# Patient Record
Sex: Male | Born: 1980 | Race: White | Hispanic: No | Marital: Single | Smoking: Never smoker
Health system: Southern US, Community
[De-identification: ages and names within clinical notes are randomized; demographics above are authoritative.]

## PROBLEM LIST (undated history)

## (undated) DIAGNOSIS — F32A Depression, unspecified: Secondary | ICD-10-CM

## (undated) DIAGNOSIS — N400 Enlarged prostate without lower urinary tract symptoms: Secondary | ICD-10-CM

## (undated) DIAGNOSIS — F329 Major depressive disorder, single episode, unspecified: Secondary | ICD-10-CM

## (undated) HISTORY — PX: WRIST SURGERY: SHX841

## (undated) HISTORY — PX: EYE SURGERY: SHX253

---

## 2004-06-24 ENCOUNTER — Emergency Department: Payer: Self-pay | Admitting: Emergency Medicine

## 2005-01-10 ENCOUNTER — Emergency Department (HOSPITAL_COMMUNITY): Admission: EM | Admit: 2005-01-10 | Discharge: 2005-01-10 | Payer: Self-pay | Admitting: *Deleted

## 2009-11-20 ENCOUNTER — Ambulatory Visit: Payer: Self-pay | Admitting: Ophthalmology

## 2010-01-01 ENCOUNTER — Ambulatory Visit: Payer: Self-pay | Admitting: Ophthalmology

## 2010-01-06 ENCOUNTER — Ambulatory Visit: Payer: Self-pay | Admitting: Ophthalmology

## 2010-10-02 ENCOUNTER — Emergency Department: Payer: Self-pay | Admitting: Emergency Medicine

## 2010-10-22 ENCOUNTER — Emergency Department: Payer: Self-pay | Admitting: Emergency Medicine

## 2012-03-17 ENCOUNTER — Emergency Department: Payer: Self-pay | Admitting: Emergency Medicine

## 2012-09-06 ENCOUNTER — Ambulatory Visit: Payer: Self-pay | Admitting: Neurology

## 2013-07-11 ENCOUNTER — Emergency Department: Payer: Self-pay | Admitting: Emergency Medicine

## 2013-08-28 ENCOUNTER — Emergency Department: Payer: Self-pay | Admitting: Emergency Medicine

## 2013-08-28 LAB — URINALYSIS, COMPLETE
BACTERIA: NONE SEEN
Bilirubin,UR: NEGATIVE
GLUCOSE, UR: NEGATIVE mg/dL (ref 0–75)
KETONE: NEGATIVE
LEUKOCYTE ESTERASE: NEGATIVE
Nitrite: NEGATIVE
PROTEIN: NEGATIVE
Ph: 6 (ref 4.5–8.0)
SQUAMOUS EPITHELIAL: NONE SEEN
Specific Gravity: 1.005 (ref 1.003–1.030)
WBC UR: <1 /HPF (ref 0–5)

## 2013-08-28 LAB — COMPREHENSIVE METABOLIC PANEL
ALK PHOS: 130 U/L — AB
ALT: 57 U/L (ref 12–78)
Albumin: 4.3 g/dL (ref 3.4–5.0)
Anion Gap: 6 — ABNORMAL LOW (ref 7–16)
BILIRUBIN TOTAL: 0.3 mg/dL (ref 0.2–1.0)
BUN: 11 mg/dL (ref 7–18)
CALCIUM: 9.4 mg/dL (ref 8.5–10.1)
CHLORIDE: 101 mmol/L (ref 98–107)
CREATININE: 0.94 mg/dL (ref 0.60–1.30)
Co2: 30 mmol/L (ref 21–32)
EGFR (African American): >60
GLUCOSE: 95 mg/dL (ref 65–99)
Osmolality: 273 (ref 275–301)
Potassium: 3.8 mmol/L (ref 3.5–5.1)
SGOT(AST): 37 U/L (ref 15–37)
Sodium: 137 mmol/L (ref 136–145)
Total Protein: 8.6 g/dL — ABNORMAL HIGH (ref 6.4–8.2)

## 2013-08-28 LAB — CBC
HCT: 46.2 % (ref 40.0–52.0)
HGB: 15.5 g/dL (ref 13.0–18.0)
MCH: 34 pg (ref 26.0–34.0)
MCHC: 33.6 g/dL (ref 32.0–36.0)
MCV: 101 fL — AB (ref 80–100)
PLATELETS: 261 10*3/uL (ref 150–440)
RBC: 4.57 10*6/uL (ref 4.40–5.90)
RDW: 13.6 % (ref 11.5–14.5)
WBC: 9.4 10*3/uL (ref 3.8–10.6)

## 2013-08-28 LAB — DRUG SCREEN, URINE

## 2013-08-28 LAB — ETHANOL: Ethanol %: <0.003 % (ref 0.000–0.080)

## 2013-08-28 LAB — ACETAMINOPHEN LEVEL

## 2013-08-28 LAB — SALICYLATE LEVEL: Salicylates, Serum: <1.7 mg/dL

## 2013-11-24 ENCOUNTER — Emergency Department: Payer: Self-pay | Admitting: Emergency Medicine

## 2014-05-15 ENCOUNTER — Emergency Department: Payer: Self-pay | Admitting: Internal Medicine

## 2014-05-15 LAB — CBC
HCT: 46.3 % (ref 40.0–52.0)
HGB: 15.6 g/dL (ref 13.0–18.0)
MCH: 32.7 pg (ref 26.0–34.0)
MCHC: 33.6 g/dL (ref 32.0–36.0)
MCV: 97 fL (ref 80–100)
Platelet: 246 10*3/uL (ref 150–440)
RBC: 4.77 10*6/uL (ref 4.40–5.90)
RDW: 13.2 % (ref 11.5–14.5)
WBC: 11.1 10*3/uL — ABNORMAL HIGH (ref 3.8–10.6)

## 2014-05-15 LAB — BASIC METABOLIC PANEL
Anion Gap: 8 (ref 7–16)
BUN: 11 mg/dL (ref 7–18)
CALCIUM: 9.3 mg/dL (ref 8.5–10.1)
CREATININE: 0.96 mg/dL (ref 0.60–1.30)
Chloride: 100 mmol/L (ref 98–107)
Co2: 30 mmol/L (ref 21–32)
GLUCOSE: 99 mg/dL (ref 65–99)
Osmolality: 275 (ref 275–301)
POTASSIUM: 4.2 mmol/L (ref 3.5–5.1)
SODIUM: 138 mmol/L (ref 136–145)

## 2014-05-17 ENCOUNTER — Emergency Department: Payer: Self-pay | Admitting: Emergency Medicine

## 2014-05-20 LAB — CULTURE, BLOOD (SINGLE)

## 2014-07-08 ENCOUNTER — Emergency Department: Payer: Self-pay | Admitting: Emergency Medicine

## 2014-08-11 NOTE — Consult Note (Signed)
Brief Consult Note: Diagnosis: MDD, Alcohol Dependence.   Patient was seen by consultant.   Consult note dictated.   Orders entered.   Comments: Pt seen for Psychiatric Eval. He denied Si/HI or plans and stated that he is willing to go for rehab. he is experiencing w/d from Effexor at this time.  Will restart the medication.  He can be d/c as he is stable.  Electronic Signatures: Rhunette CroftFaheem, Kayly Kriegel S (MD)  (Signed 12-May-15 10:29)  Authored: Brief Consult Note   Last Updated: 12-May-15 10:29 by Rhunette CroftFaheem, Nilaya Bouie S (MD)

## 2014-08-11 NOTE — Consult Note (Signed)
PATIENT NAME:  Ian Munoz, Ian Munoz MR#:  [id number] DATE OF BIRTH:  Aug 07, 1980  DATE OF CONSULTATION:  08/29/2013  REFERRING PHYSICIAN:  Sharyn Creamer, MD CONSULTING PHYSICIAN:  Ardeen Fillers. Garnetta Buddy, MD  REASON FOR CONSULTATION: "I've been feeling depressed."   HISTORY OF PRESENT ILLNESS:  The patient is a 34 year old single white male who presented to the ER and reported that he has been having issues with his depression and alcoholism. He reported that he has been feeling severely depressed as he went to the [facility] for 3 days and was discharged to a 60 day program at the [facility]. He reported that when he arrived to the rescue mission, they seized all of his medications and did not allow him to take the Effexor which was prescribed by the [facility]. He reported as soon as he stopped the Effexor he started having withdrawal symptoms from the same and was having shakes, which were getting worse. He reported that he was unable to eat and did not have any energy. He reported that he was unable to sleep. He feels like that he is going to die, although he did not have any suicidal or homicidal ideations or plans. The patient reported that initially he was prescribed Lexapro by his physician in [place], but he was unable to afford the medication. He reported that the Lexapro was not helping him as well. The patient reported that he noticed that he was becoming more energetic when he was taking the Effexor and was happier. He was taking 150 mg in the morning. The patient stated that he has not consumed alcohol since May 4th, as he was consuming 2 to 3 40-ounce at bedtime. He stated that he is willing to go to the rehabilitation program at this time. He currently denied having any withdrawal symptoms. He stated that he is interested in going back to the rehabilitation program at this time. He currently denied having any suicidal or homicidal ideations or plans.   PAST PSYCHIATRIC HISTORY: The  patient has a history of overdose on pills in the past. He was admitted to the behavioral health unit at one time in the past. He just finished a three-day rehabilitation program at [facility] in [place], and then was transferred to the [facility]. However, he is willing to go to another rehabilitation program.   FAMILY HISTORY: The patient reports a history of alcoholism in his father.   ALLERGIES: No known drug allergies.   CURRENT MEDICATION:  Effexor 150 mg in the morning.   SOCIAL HISTORY:  The patient reported that he currently lives with his sister.  Currently, he is unemployed. He has never been married and does not have any children. He currently denied any pending legal charges.   ANCILLARY DATA: Temperature 98.3, pulse 72, respirations 20, blood pressure 101/66.   LABORATORY DATA: Glucose 95, BUN 11, creatinine 0.94, sodium 137, potassium 3.8, chloride 101, bicarbonate 30, anion gap 6, osmolality 273, calcium 9.4. Blood alcohol level less than 3. Protein 8.6, albumin 4.3, bilirubin 0.3, alkaline phosphatase 130, AST 37, ALT 57. UDS is negative. WBC 9.4, RBC 4.57, hemoglobin 15.5, hematocrit 46.2, platelet count 261, MCV 101, RDW 13.6.     REVIEW OF SYSTEMS:   CONSTITUTIONAL: Denies any fever or chills. No weight changes.  EYES: No double or blurred vision.  RESPIRATORY: No shortness of breath or cough.  CARDIOVASCULAR: Denies any chest pain or orthopnea.  GASTROINTESTINAL: No abdominal pain, nausea, vomiting, diarrhea.  GENITOURINARY:  No incontinence. No hematuria.  MUSCULOSKELETAL: No muscle ache or joint pain.  NEUROLOGIC: No tingling or weakness.   MENTAL STATUS EXAMINATION: The patient is a moderately-built male who was sitting in the bed. He maintained fair eye contact. Speech was normal in tone and volume. Mood was fine. Affect was congruent. Thought process was logical, goal directed. Thought content was nondelusional. He currently denied having any  suicidal or homicidal ideation or plans. He demonstrated fair insight and judgment. His language was appropriate. Fund of knowledge seems normal. No perceptual disturbances are noted.   DIAGNOSTIC IMPRESSION: AXIS I: 1.  Major depressive disorder, recurrent, moderate.  2.  Alcohol dependence.  AXIS II:  None.  AXIS III:  None reported.   TREATMENT PLAN: 1.  The patient will be restarted back on Effexor-XR 150 mg p.o. q.a.m.  2.  I will release him from the IVC, as he does not meet the criteria at this time and will be discharged as he is clinically stable.  3.  He will be given a prescription of the Effexor-XR 150 mg at the time of discharge.  4.  He was given resources about the different area rehabilitation programs and rescue missions, and he demonstrated  understanding. I advised the patient to come back if he noticed worsening of his symptoms, and he agreed with the plan. The patient will be discharged as he is clinically stable.   Thank you for allowing me to participate in the care of this patient.   ____________________________ Ardeen FillersUzma S. Garnetta BuddyFaheem, MD usf:dmm D: 08/29/2013 12:26:05 ET T: 08/29/2013 12:51:30 ET JOB#: [id number]  cc: Ardeen FillersUzma S. Garnetta BuddyFaheem, MD, <Dictator> Rhunette CroftUZMA S Fernando Stoiber MD ELECTRONICALLY SIGNED 08/31/2013 10:59

## 2014-08-24 ENCOUNTER — Encounter: Payer: Self-pay | Admitting: *Deleted

## 2014-08-24 ENCOUNTER — Emergency Department
Admission: EM | Admit: 2014-08-24 | Discharge: 2014-08-25 | Discharge status: Against Medical Advice | Payer: Self-pay | Attending: Emergency Medicine | Admitting: Emergency Medicine

## 2014-08-24 DIAGNOSIS — R109 Unspecified abdominal pain: Secondary | ICD-10-CM | POA: Insufficient documentation

## 2014-08-24 LAB — COMPREHENSIVE METABOLIC PANEL
ALBUMIN: 4.3 g/dL (ref 3.5–5.0)
ALT: 16 U/L — ABNORMAL LOW (ref 17–63)
ANION GAP: 6 (ref 5–15)
AST: 16 U/L (ref 15–41)
Alkaline Phosphatase: 81 U/L (ref 38–126)
BUN: 9 mg/dL (ref 6–20)
CALCIUM: 9 mg/dL (ref 8.9–10.3)
CO2: 30 mmol/L (ref 22–32)
CREATININE: 0.86 mg/dL (ref 0.61–1.24)
Chloride: 103 mmol/L (ref 101–111)
GFR calc Af Amer: >60 mL/min (ref >60)
GFR calc non Af Amer: >60 mL/min (ref >60)
GLUCOSE: 111 mg/dL — AB (ref 65–99)
POTASSIUM: 4.1 mmol/L (ref 3.5–5.1)
SODIUM: 139 mmol/L (ref 135–145)
TOTAL PROTEIN: 7.2 g/dL (ref 6.5–8.1)
Total Bilirubin: 0.5 mg/dL (ref 0.3–1.2)

## 2014-08-24 LAB — CBC
HEMATOCRIT: 52.5 % — AB (ref 40.0–52.0)
HEMOGLOBIN: 17.4 g/dL (ref 13.0–18.0)
MCH: 32.5 pg (ref 26.0–34.0)
MCHC: 33.1 g/dL (ref 32.0–36.0)
MCV: 98 fL (ref 80.0–100.0)
Platelets: 250 10*3/uL (ref 150–440)
RBC: 5.35 MIL/uL (ref 4.40–5.90)
RDW: 12.9 % (ref 11.5–14.5)
WBC: 7.4 10*3/uL (ref 3.8–10.6)

## 2014-08-24 NOTE — ED Notes (Signed)
Pt reports left sided abdominal pain radiating to back, increased urination, pain with urination, swelling in left testicle

## 2014-08-25 LAB — URINALYSIS COMPLETE WITH MICROSCOPIC (ARMC ONLY)
Bacteria, UA: NONE SEEN
Bilirubin Urine: NEGATIVE
Glucose, UA: NEGATIVE mg/dL
HGB URINE DIPSTICK: NEGATIVE
KETONES UR: NEGATIVE mg/dL
Leukocytes, UA: NEGATIVE
Nitrite: NEGATIVE
Protein, ur: NEGATIVE mg/dL
SPECIFIC GRAVITY, URINE: 1.004 — AB (ref 1.005–1.030)
SQUAMOUS EPITHELIAL / LPF: NONE SEEN
pH: 7 (ref 5.0–8.0)

## 2014-10-22 ENCOUNTER — Encounter: Payer: Self-pay | Admitting: Emergency Medicine

## 2014-10-22 ENCOUNTER — Emergency Department
Admission: EM | Admit: 2014-10-22 | Discharge: 2014-10-22 | Disposition: A | Discharge status: Home / Self Care | Payer: Self-pay | Attending: Emergency Medicine | Admitting: Emergency Medicine

## 2014-10-22 DIAGNOSIS — N451 Epididymitis: Secondary | ICD-10-CM | POA: Insufficient documentation

## 2014-10-22 DIAGNOSIS — R300 Dysuria: Secondary | ICD-10-CM | POA: Insufficient documentation

## 2014-10-22 HISTORY — DX: Benign prostatic hyperplasia without lower urinary tract symptoms: N40.0

## 2014-10-22 LAB — URINALYSIS COMPLETE WITH MICROSCOPIC (ARMC ONLY)
BACTERIA UA: NONE SEEN
BILIRUBIN URINE: NEGATIVE
GLUCOSE, UA: NEGATIVE mg/dL
HGB URINE DIPSTICK: NEGATIVE
Ketones, ur: NEGATIVE mg/dL
Leukocytes, UA: NEGATIVE
NITRITE: NEGATIVE
PROTEIN: NEGATIVE mg/dL
RBC / HPF: NONE SEEN RBC/hpf (ref 0–5)
Specific Gravity, Urine: 1.003 — ABNORMAL LOW (ref 1.005–1.030)
Squamous Epithelial / LPF: NONE SEEN
WBC UA: NONE SEEN WBC/hpf (ref 0–5)
pH: 6 (ref 5.0–8.0)

## 2014-10-22 MED ORDER — OXYCODONE-ACETAMINOPHEN 5-325 MG PO TABS
ORAL_TABLET | ORAL | Status: AC
  Administered 2014-10-22: 1 via ORAL
  Filled 2014-10-22: qty 1

## 2014-10-22 MED ORDER — DOXYCYCLINE HYCLATE 100 MG PO CAPS: 100.0000 mg | ORAL_CAPSULE | Freq: Two times a day (BID) | ORAL | Status: DC

## 2014-10-22 MED ORDER — OXYCODONE-ACETAMINOPHEN 5-325 MG PO TABS: 1.0000 | ORAL_TABLET | Freq: Four times a day (QID) | ORAL | Status: DC | PRN

## 2014-10-22 MED ORDER — OXYCODONE-ACETAMINOPHEN 5-325 MG PO TABS
1.0000 | ORAL_TABLET | Freq: Once | ORAL | Status: AC
  Administered 2014-10-22: 1 via ORAL

## 2014-10-22 NOTE — Discharge Instructions (Signed)
Epididymitis Epididymitis is a swelling (inflammation) of the epididymis. The epididymis is a cord-like structure along the back part of the testicle. Epididymitis is usually, but not always, caused by infection. This is usually a sudden problem beginning with chills, fever and pain behind the scrotum and in the testicle. There may be swelling and redness of the testicle. DIAGNOSIS  Physical examination will reveal a tender, swollen epididymis. Sometimes, cultures are obtained from the urine or from prostate secretions to help find out if there is an infection or if the cause is a different problem. Sometimes, blood work is performed to see if your white blood cell count is elevated and if a germ (bacterial) or viral infection is present. Using this knowledge, an appropriate medicine which kills germs (antibiotic) can be chosen by your caregiver. A viral infection causing epididymitis will most often go away (resolve) without treatment. HOME CARE INSTRUCTIONS   Hot sitz baths for 20 minutes, 4 times per day, may help relieve pain.  Only take over-the-counter or prescription medicines for pain, discomfort or fever as directed by your caregiver.  Take all medicines, including antibiotics, as directed. Take the antibiotics for the full prescribed length of time even if you are feeling better.  It is very important to keep all follow-up appointments. SEEK IMMEDIATE MEDICAL CARE IF:   You have a fever.  You have pain not relieved with medicines.  You have any worsening of your problems.  Your pain seems to come and go.  You develop pain, redness, and swelling in the scrotum and surrounding areas. MAKE SURE YOU:   Understand these instructions.  Will watch your condition.  Will get help right away if you are not doing well or get worse. Document Released: 04/03/2000 Document Revised: 06/29/2011 Document Reviewed: 02/21/2009 [facility] Patient Information 2015 [employer] [place]. This information  is not intended to replace advice given to you by your health care provider. Make sure you discuss any questions you have with your health care provider.   You were prescribed a medication that is potentially sedating. Do not drink alcohol, drive or participate in any other potentially dangerous activities while taking this medication as it may make you sleepy. Do not take this medication with any other sedating medications, either prescription or over-the-counter. If you were prescribed Percocet or Vicodin, do not take these with acetaminophen (Tylenol) as it is already contained within these medications.   Opioid pain medications (or "narcotics") can be habit forming.  Use it as little as possible to achieve adequate pain control.  Do not use or use it with extreme caution if you have a history of opiate abuse or dependence.  If you are on a pain contract with your primary care doctor or a pain specialist, be sure to let them know you were prescribed this medication today from the [facility].  This medication is intended for your use only - do not give any to anyone else and keep it in a secure place where nobody else, especially children and pets, have access to it.  It will also cause or worsen constipation, so you may want to consider taking an over-the-counter stool softener while you are taking this medication.

## 2014-10-22 NOTE — ED Notes (Signed)
Pt arrived to the ED for complaints of left side groin pain. Pt [facility] and the Pt states that he is taking Flomax for it. Pt is AOx4 in no apparent distress.

## 2014-10-22 NOTE — ED Provider Notes (Signed)
[facility] Emergency Department Provider Note  ____________________________________________  Time seen: 8:45 PM  I have reviewed the triage vital signs and the nursing notes.   HISTORY  Chief Complaint Groin Pain    HPI Ian Munoz is a 34 y.o. male who complains of worsened left groin pain for the past few days. Review of available records in urology notes reveals that he has had this problem for 3-4 years. The patient states there are no serious changes other than the increase in pain. He and his significant other reports being frustrated with the chronicity of his symptoms without any apparent cause. He does note that he was seen in urology clinic a week ago where he was noted to have a boggy tender prostate consistent with prostatitis, but was started on Flomax for this. He has not taken any antibiotics so far. He denies any penile discharge but does report some dysuria.     Past Medical History  Diagnosis Date  . Prostate enlargement     There are no active problems to display for this patient.   History reviewed. No pertinent past surgical history.  Current Outpatient Rx  Name  Route  Sig  Dispense  Refill  . doxycycline (VIBRAMYCIN) 100 MG capsule   Oral   Take 1 capsule (100 mg total) by mouth 2 (two) times daily.   28 capsule   0   . oxyCODONE-acetaminophen (ROXICET) 5-325 MG per tablet   Oral   Take 1 tablet by mouth every 6 (six) hours as needed for severe pain.   8 tablet   0     Allergies Review of patient's allergies indicates no known allergies.  History reviewed. No pertinent family history.  Social History History  Substance Use Topics  . Smoking status: Never Smoker   . Smokeless tobacco: Current User    Types: Chew  . Alcohol Use: No    Review of Systems  Constitutional: No fever or chills. No weight changes Eyes:No blurry vision or double vision.  ENT: No sore throat. Cardiovascular: No chest  pain. Respiratory: No dyspnea or cough. Gastrointestinal: Negative for abdominal pain, vomiting and diarrhea.  No BRBPR or melena. Genitourinary: Positive dysuria and left scrotal pain. Musculoskeletal: Negative for back pain. No joint swelling or pain. Skin: Negative for rash. Neurological: Negative for headaches, focal weakness or numbness. Psychiatric:No anxiety or depression.   Endocrine:No hot/cold intolerance, changes in energy, or sleep difficulty.  10-point ROS otherwise negative.  ____________________________________________   PHYSICAL EXAM:  VITAL SIGNS: ED Triage Vitals  Enc Vitals Group     BP 10/22/14 2007 134/69 mmHg     Pulse Rate 10/22/14 2007 111     Resp 10/22/14 2007 18     Temp 10/22/14 2007 98.2 F (36.8 C)     Temp src --      SpO2 10/22/14 2007 100 %     Weight 10/22/14 2007 145 lb (65.772 kg)     Height 10/22/14 2007  (1.575 m)     Head Cir --      Peak Flow --      Pain Score 10/22/14 2008 8     Pain Loc --      Pain Edu? --      Excl. in GC? --      Constitutional: Alert and oriented. Well appearing and in no distress. Eyes: No scleral icterus. No conjunctival pallor. PERRL. EOMI ENT   Head: Normocephalic and atraumatic.   Nose:  No congestion/rhinnorhea. No septal hematoma   Mouth/Throat: MMM, no pharyngeal erythema. No peritonsillar mass. No uvula shift.   Neck: No stridor. No SubQ emphysema. No meningismus. Hematological/Lymphatic/Immunilogical: No cervical lymphadenopathy. Cardiovascular: RRR. Normal and symmetric distal pulses are present in all extremities. No murmurs, rubs, or gallops. Respiratory: Normal respiratory effort without tachypnea nor retractions. Breath sounds are clear and equal bilaterally. No wheezes/rales/rhonchi. Gastrointestinal: Soft and nontender. No distention. There is no CVA tenderness.  No rebound, rigidity, or guarding. Genitourinary: Normal-appearing genitalia. No rash or swelling. There is  tenderness of the epididymis bilaterally. No testicular tenderness or abnormal lie. Musculoskeletal: Nontender with normal range of motion in all extremities. No joint effusions.  No lower extremity tenderness.  No edema. Neurologic:   Normal speech and language.  CN 2-10 normal. Motor grossly intact. No pronator drift.  Normal gait. No gross focal neurologic deficits are appreciated.  Skin:  Skin is warm, dry and intact. No rash noted.  No petechiae, purpura, or bullae. Psychiatric: Mood and affect are normal. Speech and behavior are normal. Patient exhibits appropriate insight and judgment.  ____________________________________________    LABS (pertinent positives/negatives) (all labs ordered are listed, but only abnormal results are displayed) Labs Reviewed  URINALYSIS COMPLETEWITH MICROSCOPIC ([facility] ONLY) - Abnormal; Notable for the following:    Color, Urine COLORLESS (*)    APPearance CLEAR (*)    Specific Gravity, Urine 1.003 (*)    All other components within normal limits   ____________________________________________   EKG    ____________________________________________    RADIOLOGY    ____________________________________________   PROCEDURES  ____________________________________________   INITIAL IMPRESSION / ASSESSMENT AND PLAN / ED COURSE  Pertinent labs & imaging results that were available during my care of the patient were reviewed by me and considered in my medical decision making (see chart for details).  Evaluation consistent with epididymitis, and by history the patient may have prostatitis 2. This sounds very chronic in nature undergoing urology evaluation with likely plan for cystoscopy in the future. We will evaluate for urinary tract infection with urinalysis. Plan to start the patient on doxycycline for trial of symptom relief, as well as Percocet as needed for symptom control. Advised to follow-up with urology for continued monitoring and  management. No evidence of torsion.  ____________________________________________   FINAL CLINICAL IMPRESSION(S) / ED DIAGNOSES  Final diagnoses:  Epididymitis      Sharman CheekPhillip Jodelle Fausto, MD 10/22/14 [phone]

## 2014-11-14 DIAGNOSIS — F32A Depression, unspecified: Secondary | ICD-10-CM | POA: Insufficient documentation

## 2015-02-05 ENCOUNTER — Encounter: Payer: Self-pay | Admitting: *Deleted

## 2015-02-05 ENCOUNTER — Emergency Department
Admission: EM | Admit: 2015-02-05 | Discharge: 2015-02-05 | Disposition: A | Discharge status: Home / Self Care | Payer: Self-pay | Attending: Emergency Medicine | Admitting: Emergency Medicine

## 2015-02-05 ENCOUNTER — Emergency Department: Payer: Self-pay

## 2015-02-05 DIAGNOSIS — M25511 Pain in right shoulder: Secondary | ICD-10-CM | POA: Insufficient documentation

## 2015-02-05 MED ORDER — DIAZEPAM 5 MG/ML IJ SOLN
5.0000 mg | Freq: Once | INTRAMUSCULAR | Status: AC
  Administered 2015-02-05: 5 mg via INTRAMUSCULAR
  Filled 2015-02-05: qty 2

## 2015-02-05 MED ORDER — DIAZEPAM 5 MG/ML IJ SOLN: 5.0000 mg | Freq: Once | INTRAMUSCULAR | Status: DC

## 2015-02-05 MED ORDER — IBUPROFEN 800 MG PO TABS: 800.0000 mg | ORAL_TABLET | Freq: Three times a day (TID) | ORAL | Status: DC | PRN

## 2015-02-05 MED ORDER — CYCLOBENZAPRINE HCL 10 MG PO TABS: 10.0000 mg | ORAL_TABLET | Freq: Three times a day (TID) | ORAL | Status: DC | PRN

## 2015-02-05 MED ORDER — KETOROLAC TROMETHAMINE 60 MG/2ML IM SOLN
60.0000 mg | Freq: Once | INTRAMUSCULAR | Status: AC
  Administered 2015-02-05: 60 mg via INTRAMUSCULAR
  Filled 2015-02-05: qty 2

## 2015-02-05 NOTE — Discharge Instructions (Signed)
Joint Pain Joint pain, which is also called arthralgia, can be caused by many things. Joint pain often goes away when you follow your health care provider's instructions for relieving pain at home. However, joint pain can also be caused by conditions that require further treatment. Common causes of joint pain include: 1. Bruising in the area of the joint. 2. Overuse of the joint. 3. Wear and tear on the joints that occur with aging (osteoarthritis). 4. Various other forms of arthritis. 5. A buildup of a crystal form of uric acid in the joint (gout). 6. Infections of the joint (septic arthritis) or of the bone (osteomyelitis). Your health care provider may recommend medicine to help with the pain. If your joint pain continues, additional tests may be needed to diagnose your condition. HOME CARE INSTRUCTIONS Watch your condition for any changes. Follow these instructions as directed to lessen the pain that you are feeling. 1. Take medicines only as directed by your health care provider. 2. Rest the affected area for as long as your health care provider says that you should. If directed to do so, raise the painful joint above the level of your heart while you are sitting or lying down. 3. Do not do things that cause or worsen pain. 4. If directed, apply ice to the painful area:  Put ice in a plastic bag.  Place a towel between your skin and the bag.  Leave the ice on for 20 minutes, 2-3 times per day. 5. Wear an elastic bandage, splint, or sling as directed by your health care provider. Loosen the elastic bandage or splint if your fingers or toes become numb and tingle, or if they turn cold and blue. 6. Begin exercising or stretching the affected area as directed by your health care provider. Ask your health care provider what types of exercise are safe for you. 7. Keep all follow-up visits as directed by your health care provider. This is important. SEEK MEDICAL CARE IF: 1. Your pain  increases, and medicine does not help. 2. Your joint pain does not improve within 3 days. 3. You have increased bruising or swelling. 4. You have a fever. 5. You lose 10 lb (4.5 kg) or more without trying. SEEK IMMEDIATE MEDICAL CARE IF: 1. You are not able to move the joint. 2. Your fingers or toes become numb or they turn cold and blue.   This information is not intended to replace advice given to you by your health care provider. Make sure you discuss any questions you have with your health care provider.   Document Released: 04/06/2005 Document Revised: 04/27/2014 Document Reviewed: 01/16/2014 Elsevier Interactive Patient Education 2016 [employer]  [employer] Therapy Heat therapy can help ease sore, stiff, injured, and tight muscles and joints. Heat relaxes your muscles, which may help ease your pain.  RISKS AND COMPLICATIONS If you have any of the following conditions, do not use heat therapy unless your health care provider has approved: 7. Poor circulation. 8. Healing wounds or scarred skin in the area being treated. 9. Diabetes, heart disease, or high blood pressure. 10. Not being able to feel (numbness) the area being treated. 11. Unusual swelling of the area being treated. 12. Active infections. 13. Blood clots. 14. Cancer. 15. Inability to communicate pain. This may include young children and people who have problems with their brain function (dementia). 16. Pregnancy. Heat therapy should only be used on old, pre-existing, or long-lasting (chronic) injuries. Do not use heat therapy on new injuries  unless directed by your health care provider. HOW TO USE HEAT THERAPY There are several different kinds of heat therapy, including: 8. Moist heat pack. 9. Warm water bath. 10. Hot water bottle. 11. Electric heating pad. 12. Heated gel pack. 13. Heated wrap. 14. Electric heating pad. Use the heat therapy method suggested by your health care provider. Follow your health care  provider's instructions on when and how to use heat therapy. GENERAL HEAT THERAPY RECOMMENDATIONS 6. Do not sleep while using heat therapy. Only use heat therapy while you are awake. 7. Your skin may turn pink while using heat therapy. Do not use heat therapy if your skin turns red. 8. Do not use heat therapy if you have new pain. 9. High heat or long exposure to heat can cause burns. Be careful when using heat therapy to avoid burning your skin. 10. Do not use heat therapy on areas of your skin that are already irritated, such as with a rash or sunburn. SEEK MEDICAL CARE IF: 3. You have blisters, redness, swelling, or numbness. 4. You have new pain. 5. Your pain is worse. MAKE SURE YOU: 1. Understand these instructions. 2. Will watch your condition. 3. Will get help right away if you are not doing well or get worse.   This information is not intended to replace advice given to you by your health care provider. Make sure you discuss any questions you have with your health care provider.   Document Released: 06/29/2011 Document Revised: 04/27/2014 Document Reviewed: 05/30/2013 Elsevier Interactive Patient Education 2016 Else[employer] Inc[employer]  Shoulder Pain The shoulder is the joint that connects your arms to your body. The bones that form the shoulder joint include the upper arm bone (humerus), the shoulder blade (scapula), and the collarbone (clavicle). The top of the humerus is shaped like a ball and fits into a rather flat socket on the scapula (glenoid cavity). A combination of muscles and strong, fibrous tissues that connect muscles to bones (tendons) support your shoulder joint and hold the ball in the socket. Small, fluid-filled sacs (bursae) are located in different areas of the joint. They act as cushions between the bones and the overlying soft tissues and help reduce friction between the gliding tendons and the bone as you move your arm. Your shoulder joint allows a wide range of motion in  your arm. This range of motion allows you to do things like scratch your back or throw a ball. However, this range of motion also makes your shoulder more prone to pain from overuse and injury. Causes of shoulder pain can originate from both injury and overuse and usually can be grouped in the following four categories: 17. Redness, swelling, and pain (inflammation) of the tendon (tendinitis) or the bursae (bursitis). 18. Instability, such as a dislocation of the joint. 19. Inflammation of the joint (arthritis). 20. Broken bone (fracture). HOME CARE INSTRUCTIONS  15. Apply ice to the sore area.  Put ice in a plastic bag.  Place a towel between your skin and the bag.  Leave the ice on for 15-20 minutes, 3-4 times per day for the first 2 days, or as directed by your health care provider. 16. Stop using cold packs if they do not help with the pain. 17. If you have a shoulder sling or immobilizer, wear it as long as your caregiver instructs. Only remove it to shower or bathe. Move your arm as little as possible, but keep your hand moving to prevent swelling. 18. Squeeze a soft  ball or foam pad as much as possible to help prevent swelling. 19. Only take over-the-counter or prescription medicines for pain, discomfort, or fever as directed by your caregiver. SEEK MEDICAL CARE IF:  11. Your shoulder pain increases, or new pain develops in your arm, hand, or fingers. 12. Your hand or fingers become cold and numb. 13. Your pain is not relieved with medicines. SEEK IMMEDIATE MEDICAL CARE IF:  6. Your arm, hand, or fingers are numb or tingling. 7. Your arm, hand, or fingers are significantly swollen or turn white or blue. MAKE SURE YOU:  4. Understand these instructions. 5. Will watch your condition. 6. Will get help right away if you are not doing well or get worse.   This information is not intended to replace advice given to you by your health care provider. Make sure you discuss any questions  you have with your health care provider.   Document Released: 01/14/2005 Document Revised: 04/27/2014 Document Reviewed: 07/30/2014 Elsevier Interactive Patient Education 2016 [employer] Inc[employer]  Shoulder Range of Motion Exercises Shoulder range of motion (ROM) exercises are designed to keep the shoulder moving freely. They are often recommended for people who have shoulder pain. MOVEMENT EXERCISE When you are able, do this exercise 5-6 days per week, or as told by your health care provider. Work toward doing 2 sets of 10 swings. Pendulum Exercise How To Do This Exercise Lying Down 21. Lie face-down on a bed with your abdomen close to the side of the bed. 22. Let your arm hang over the side of the bed. 23. Relax your shoulder, arm, and hand. 24. Slowly and gently swing your arm forward and back. Do not use your neck muscles to swing your arm. They should be relaxed. If you are struggling to swing your arm, have someone gently swing it for you. When you do this exercise for the first time, swing your arm at a 15 degree angle for 15 seconds, or swing your arm 10 times. As pain lessens over time, increase the angle of the swing to 30-45 degrees. 25. Repeat steps 1-4 with the other arm. How To Do This Exercise While Standing 20. Stand next to a sturdy chair or table and hold on to it with your hand.  Bend forward at the waist.  Bend your knees slightly.  Relax your other arm and let it hang limp.  Relax the shoulder blade of the arm that is hanging and let it drop.  While keeping your shoulder relaxed, use body motion to swing your arm in small circles. The first time you do this exercise, swing your arm for about 30 seconds or 10 times. When you do it next time, swing your arm for a little longer.  Stand up tall and relax.  Repeat steps 1-7, this time changing the direction of the circles. 21. Repeat steps 1-8 with the other arm. STRETCHING EXERCISES Do these exercises 3-4 times per day on  5-6 days per week or as told by your health care provider. Work toward holding the stretch for 20 seconds. Stretching Exercise 1 14. Lift your arm straight out in front of you. 15. Bend your arm 90 degrees at the elbow (right angle) so your forearm goes across your body and looks like the letter "L." 16. Use your other arm to gently pull the elbow forward and across your body. 17. Repeat steps 1-3 with the other arm. Stretching Exercise 2 You will need a towel or rope for this exercise. 8.  Bend one arm behind your back with the palm facing outward. 9. Hold a towel with your other hand. 10. Reach the arm that holds the towel above your head, and bend that arm at the elbow. Your wrist should be behind your neck. 11. Use your free hand to grab the free end of the towel. 12. With the higher hand, gently pull the towel up behind you. 13. With the lower hand, pull the towel down behind you. 14. Repeat steps 1-6 with the other arm. STRENGTHENING EXERCISES Do each of these exercises at four different times of day (sessions) every day or as told by your health care provider. To begin with, repeat each exercise 5 times (repetitions). Work toward doing 3 sets of 12 repetitions or as told by your health care provider. Strengthening Exercise 1 You will need a light weight for this activity. As you grow stronger, you may use a heavier weight. 7. Standing with a weight in your hand, lift your arm straight out to the side until it is at the same height as your shoulder. 8. Bend your arm at 90 degrees so that your fingers are pointing to the ceiling. 9. Slowly raise your hand until your arm is straight up in the air. 10. Repeat steps 1-3 with the other arm. Strengthening Exercise 2 You will need a light weight for this activity. As you grow stronger, you may use a heavier weight. 1. Standing with a weight in your hand, gradually move your straight arm in an arc, starting at your side, then out in front of  you, then straight up over your head. 2. Gradually move your other arm in an arc, starting at your side, then out in front of you, then straight up over your head. 3. Repeat steps 1-2 with the other arm. Strengthening Exercise 3 You will need an elastic band for this activity. As you grow stronger, gradually increase the size of the bands or increase the number of bands that you use at one time. 1. While standing, hold an elastic band in one hand and raise that arm up in the air. 2. With your other hand, pull down the band until that hand is by your side. 3. Repeat steps 1-2 with the other arm.   This information is not intended to replace advice given to you by your health care provider. Make sure you discuss any questions you have with your health care provider.   Document Released: 01/03/2003 Document Revised: 08/21/2014 Document Reviewed: 04/02/2014 Elsevier Interactive Patient Education [employer]

## 2015-02-05 NOTE — ED Notes (Addendum)
Pt placed on med hold, made aware and verbalized understanding at this time. Will be d/c at 2230

## 2015-02-05 NOTE — ED Notes (Signed)
Pt reports onset of right shoulder pain since waking up this morning. No meds prior to arrival.

## 2015-02-05 NOTE — ED Provider Notes (Signed)
[facility] Emergency Department Provider Note  ____________________________________________  Time seen: Approximately 9:36 PM  I have reviewed the triage vital signs and the nursing notes.   HISTORY  Chief Complaint Shoulder Pain    HPI Ian Munoz is a 34 y.o. male presents for evaluation of right shoulder pain onset this morning after awakening. Denies any trauma. Patient states his arm has  been hurting all day. Increased pain with lifting.   Past Medical History  Diagnosis Date  . Prostate enlargement     There are no active problems to display for this patient.   History reviewed. No pertinent past surgical history.  Current Outpatient Rx  Name  Route  Sig  Dispense  Refill  . cyclobenzaprine (FLEXERIL) 10 MG tablet   Oral   Take 1 tablet (10 mg total) by mouth every 8 (eight) hours as needed for muscle spasms.   30 tablet   1   . ibuprofen (ADVIL,MOTRIN) 800 MG tablet   Oral   Take 1 tablet (800 mg total) by mouth every 8 (eight) hours as needed.   30 tablet   0     Allergies Review of patient's allergies indicates no known allergies.  No family history on file.  Social History Social History  Substance Use Topics  . Smoking status: Never Smoker   . Smokeless tobacco: Current User    Types: Chew  . Alcohol Use: No    Review of Systems Constitutional: No fever/chills Eyes: No visual changes. ENT: No sore throat. Cardiovascular: Denies chest pain. Respiratory: Denies shortness of breath. Gastrointestinal: No abdominal pain.  No nausea, no vomiting.  No diarrhea.  No constipation. Genitourinary: Negative for dysuria. Musculoskeletal: Positive for right shoulder pain. Skin: Negative for rash. Neurological: Negative for headaches, focal weakness or numbness.  10-point ROS otherwise negative.  ____________________________________________   PHYSICAL EXAM:  VITAL SIGNS: ED Triage Vitals  Enc Vitals Group     BP  02/05/15 2113 121/73 mmHg     Pulse Rate 02/05/15 2113 92     Resp 02/05/15 2113 18     Temp 02/05/15 2113 98 F (36.7 C)     Temp Source 02/05/15 2113 Oral     SpO2 02/05/15 2113 100 %     Weight 02/05/15 2113 150 lb (68.04 kg)     Height 02/05/15 2113 5\' 2"  (1.575 m)     Head Cir --      Peak Flow --      Pain Score 02/05/15 2114 8     Pain Loc --      Pain Edu? --      Excl. in GC? --    Constitutional: Alert and oriented. Well appearing and in no acute distress. Eyes: Conjunctivae are normal. PERRL. EOMI. Head: Atraumatic. Nose: No congestion/rhinnorhea. Mouth/Throat: Mucous membranes are moist.  Oropharynx non-erythematous. Neck: No stridor.   Cardiovascular: Normal rate, regular rhythm. Grossly normal heart sounds.  Good peripheral circulation. Respiratory: Normal respiratory effort.  No retractions. Lungs CTAB. Musculoskeletal: Right shoulder with full range of motion however increased pain with all motion. Neurologic:  Normal speech and language. No gross focal neurologic deficits are appreciated. No gait instability. Skin:  Skin is warm, dry and intact. No rash noted. Psychiatric: Mood and affect are normal. Speech and behavior are normal.  ____________________________________________   LABS (all labs ordered are listed, but only abnormal results are displayed)  Labs Reviewed - No data to display ____________________________________________   RADIOLOGY  No  evidence of fracture dislocation to DJD. ____________________________________________   PROCEDURES  Procedure(s) performed: None  Critical Care performed: No  ____________________________________________   INITIAL IMPRESSION / ASSESSMENT AND PLAN / ED COURSE  Pertinent labs & imaging results that were available during my care of the patient were reviewed by me and considered in my medical decision making (see chart for details).  Acute right shoulder strain. Rx given for Motrin 800 mg 3 times a day  and Flexeril 10 mg 3 times a day. Patient given an injection of Toradol and IM while in the ED. Follow up with PCP as directed. ____________________________________________   FINAL CLINICAL IMPRESSION(S) / ED DIAGNOSES  Final diagnoses:  Shoulder pain, acute, right      Evangeline Dakin, PA-C 02/05/15 2204  Evangeline Dakin, PA-C 02/05/15 2206  Jeanmarie Plant, MD 02/06/15 [phone]

## 2015-05-30 ENCOUNTER — Emergency Department
Admission: EM | Admit: 2015-05-30 | Discharge: 2015-05-30 | Disposition: A | Discharge status: Home / Self Care | Payer: Self-pay | Attending: Emergency Medicine | Admitting: Emergency Medicine

## 2015-05-30 ENCOUNTER — Emergency Department: Payer: Self-pay

## 2015-05-30 ENCOUNTER — Encounter: Payer: Self-pay | Admitting: Emergency Medicine

## 2015-05-30 DIAGNOSIS — R0789 Other chest pain: Secondary | ICD-10-CM | POA: Insufficient documentation

## 2015-05-30 DIAGNOSIS — R0602 Shortness of breath: Secondary | ICD-10-CM | POA: Insufficient documentation

## 2015-05-30 LAB — CBC
HEMATOCRIT: 43.9 % (ref 40.0–52.0)
HEMOGLOBIN: 15.1 g/dL (ref 13.0–18.0)
MCH: 34.1 pg — ABNORMAL HIGH (ref 26.0–34.0)
MCHC: 34.4 g/dL (ref 32.0–36.0)
MCV: 99.2 fL (ref 80.0–100.0)
Platelets: 248 10*3/uL (ref 150–440)
RBC: 4.42 MIL/uL (ref 4.40–5.90)
RDW: 13.1 % (ref 11.5–14.5)
WBC: 9.4 10*3/uL (ref 3.8–10.6)

## 2015-05-30 LAB — BASIC METABOLIC PANEL
Anion gap: 7 (ref 5–15)
BUN: 10 mg/dL (ref 6–20)
CO2: 29 mmol/L (ref 22–32)
CREATININE: 0.76 mg/dL (ref 0.61–1.24)
Calcium: 9.5 mg/dL (ref 8.9–10.3)
Chloride: 101 mmol/L (ref 101–111)
GFR calc Af Amer: >60 mL/min (ref >60)
Glucose, Bld: 135 mg/dL — ABNORMAL HIGH (ref 65–99)
Potassium: 3.9 mmol/L (ref 3.5–5.1)
SODIUM: 137 mmol/L (ref 135–145)

## 2015-05-30 LAB — TROPONIN I

## 2015-05-30 MED ORDER — OXYCODONE-ACETAMINOPHEN 5-325 MG PO TABS
2.0000 | ORAL_TABLET | Freq: Once | ORAL | Status: AC
  Administered 2015-05-30: 2 via ORAL
  Filled 2015-05-30: qty 2

## 2015-05-30 MED ORDER — CYCLOBENZAPRINE HCL 10 MG PO TABS: 10.0000 mg | ORAL_TABLET | Freq: Three times a day (TID) | ORAL | Status: AC | PRN

## 2015-05-30 MED ORDER — IBUPROFEN 800 MG PO TABS: 800.0000 mg | ORAL_TABLET | Freq: Three times a day (TID) | ORAL | Status: DC | PRN

## 2015-05-30 MED ORDER — IBUPROFEN 800 MG PO TABS
800.0000 mg | ORAL_TABLET | Freq: Once | ORAL | Status: AC
  Administered 2015-05-30: 800 mg via ORAL
  Filled 2015-05-30: qty 1

## 2015-05-30 NOTE — ED Provider Notes (Signed)
[facility] Emergency Department Provider Note     Time seen: ----------------------------------------- 6:23 PM on 05/30/2015 -----------------------------------------    I have reviewed the triage vital signs and the nursing notes.   HISTORY  Chief Complaint Chest Pain and Shortness of Breath    HPI Ian Munoz is a 35 y.o. male who presents ER with left-sided chest pain that radiates into his back with shortness of breath. Started 1 hour prior to arrival. Patient states she was moving heavy furniture all week.Movement makes his symptoms worse, nothing makes it better. Patient states she's felt this way in the past when he over exerted himself.   Past Medical History  Diagnosis Date  . Prostate enlargement     There are no active problems to display for this patient.   History reviewed. No pertinent past surgical history.  Allergies Review of patient's allergies indicates no known allergies.  Social History Social History  Substance Use Topics  . Smoking status: Never Smoker   . Smokeless tobacco: Current User    Types: Chew  . Alcohol Use: No    Review of Systems Constitutional: Negative for fever. Eyes: Negative for visual changes. ENT: Negative for sore throat. Cardiovascular: Positive for chest pain Respiratory: Positive for shortness of breath Gastrointestinal: Negative for abdominal pain, vomiting and diarrhea. Genitourinary: Negative for dysuria. Musculoskeletal: Negative for back pain. Skin: Negative for rash. Neurological: Negative for headaches, focal weakness or numbness.  10-point ROS otherwise negative.  ____________________________________________   PHYSICAL EXAM:  VITAL SIGNS: ED Triage Vitals  Enc Vitals Group     BP 05/30/15 1740 117/82 mmHg     Pulse Rate 05/30/15 1740 91     Resp 05/30/15 1740 19     Temp 05/30/15 1740 97.7 F (36.5 C)     Temp Source 05/30/15 1740 Oral     SpO2 05/30/15 1740 99  %     Weight 05/30/15 1740 160 lb (72.576 kg)     Height 05/30/15 1740  (1.575 m)     Head Cir --      Peak Flow --      Pain Score 05/30/15 1738 6     Pain Loc --      Pain Edu? --      Excl. in GC? --     Constitutional: Alert and oriented. Well appearing and in no distress. Eyes: Conjunctivae are normal. PERRL. Normal extraocular movements. ENT   Head: Normocephalic and atraumatic.   Nose: No congestion/rhinnorhea.   Mouth/Throat: Mucous membranes are moist.   Neck: No stridor. Cardiovascular: Normal rate, regular rhythm. Normal and symmetric distal pulses are present in all extremities. No murmurs, rubs, or gallops. Respiratory: Normal respiratory effort without tachypnea nor retractions. Breath sounds are clear and equal bilaterally. No wheezes/rales/rhonchi. Gastrointestinal: Soft and nontender. No distention. No abdominal bruits.  Musculoskeletal: Nontender with normal range of motion in all extremities. No joint effusions.  No lower extremity tenderness nor edema. Mild left chest wall tenderness Neurologic:  Normal speech and language. No gross focal neurologic deficits are appreciated. Speech is normal. No gait instability. Skin:  Skin is warm, dry and intact. No rash noted. Psychiatric: Mood and affect are normal. Speech and behavior are normal. Patient exhibits appropriate insight and judgment. ____________________________________________  EKG: Interpreted by me. Normal sinus rhythm with a rate of 91 bpm, normal. Normal, normal QRS, normal QT interval. Normal axis. No evidence of acute infarction.  ____________________________________________  ED COURSE:  Pertinent labs & imaging  results that were available during my care of the patient were reviewed by me and considered in my medical decision making (see chart for details). Check basic labs and x-rays and reevaluate. ____________________________________________    LABS (pertinent  positives/negatives)  Labs Reviewed  CBC - Abnormal; Notable for the following:    MCH 34.1 (*)    All other components within normal limits  BASIC METABOLIC PANEL  TROPONIN I    RADIOLOGY  Chest x-ray Chest x-ray is unremarkable ____________________________________________  FINAL ASSESSMENT AND PLAN  Chest pain  Plan: Patient with labs and imaging as dictated above. Likely chest wall pain. He is low risk for PE. Symptoms consistent with pulmonary. He is stable for outpatient follow-up.   Emily Filbert, MD   Emily Filbert, MD 05/30/15 [phone]

## 2015-05-30 NOTE — Discharge Instructions (Signed)

## 2015-05-30 NOTE — ED Notes (Addendum)
Pt reports left sided chest pain that radiates into back with shortness of breath; started 1 hr PTA. Reports he was moving heavy furniture all week.

## 2015-07-06 ENCOUNTER — Emergency Department: Payer: Self-pay

## 2015-07-06 ENCOUNTER — Emergency Department
Admission: EM | Admit: 2015-07-06 | Discharge: 2015-07-06 | Disposition: A | Discharge status: Home / Self Care | Payer: Self-pay | Attending: Emergency Medicine | Admitting: Emergency Medicine

## 2015-07-06 ENCOUNTER — Encounter: Payer: Self-pay | Admitting: Emergency Medicine

## 2015-07-06 DIAGNOSIS — Y9389 Activity, other specified: Secondary | ICD-10-CM | POA: Insufficient documentation

## 2015-07-06 DIAGNOSIS — M7089 Other soft tissue disorders related to use, overuse and pressure multiple sites: Secondary | ICD-10-CM | POA: Insufficient documentation

## 2015-07-06 DIAGNOSIS — M778 Other enthesopathies, not elsewhere classified: Secondary | ICD-10-CM | POA: Insufficient documentation

## 2015-07-06 DIAGNOSIS — T148XXA Other injury of unspecified body region, initial encounter: Secondary | ICD-10-CM

## 2015-07-06 DIAGNOSIS — X503XXA Overexertion from repetitive movements, initial encounter: Secondary | ICD-10-CM

## 2015-07-06 MED ORDER — IBUPROFEN 800 MG PO TABS
800.0000 mg | ORAL_TABLET | Freq: Once | ORAL | Status: AC
  Administered 2015-07-06: 800 mg via ORAL
  Filled 2015-07-06: qty 1

## 2015-07-06 MED ORDER — OXYCODONE-ACETAMINOPHEN 5-325 MG PO TABS
1.0000 | ORAL_TABLET | Freq: Once | ORAL | Status: AC
  Administered 2015-07-06: 1 via ORAL
  Filled 2015-07-06: qty 1

## 2015-07-06 MED ORDER — TRAMADOL HCL 50 MG PO TABS: 50.0000 mg | ORAL_TABLET | Freq: Four times a day (QID) | ORAL | Status: DC | PRN

## 2015-07-06 MED ORDER — NAPROXEN 500 MG PO TABS: 500.0000 mg | ORAL_TABLET | Freq: Two times a day (BID) | ORAL | Status: DC

## 2015-07-06 NOTE — ED Provider Notes (Signed)
[facility] Emergency Department Provider Note  ____________________________________________  Time seen: Approximately 8:54 PM  I have reviewed the triage vital signs and the nursing notes.   HISTORY  Chief Complaint Wrist Pain    HPI Ian Munoz is a 35 y.o. male patient complaining of right wrist pain that began at work today. Patient gives history repetitive wrist movement. Patient say works at a [employer] does not want to follow this at work compensation injury. No palliative measures taken prior to arrival. Patient is right-hand dominant.Patient was given ice pack upon arrival to triage. Patient is rating his pain as a 9/10. Patient stated pain increases with extension of the wrist. Patient states pain radiates to mid right forearm.   Past Medical History  Diagnosis Date  . Prostate enlargement     There are no active problems to display for this patient.   Past Surgical History  Procedure Laterality Date  . Wrist surgery Right     Current Outpatient Rx  Name  Route  Sig  Dispense  Refill  . cyclobenzaprine (FLEXERIL) 10 MG tablet   Oral   Take 1 tablet (10 mg total) by mouth every 8 (eight) hours as needed for muscle spasms.   30 tablet   1   . cyclobenzaprine (FLEXERIL) 10 MG tablet   Oral   Take 1 tablet (10 mg total) by mouth every 8 (eight) hours as needed for muscle spasms.   30 tablet   1   . ibuprofen (ADVIL,MOTRIN) 800 MG tablet   Oral   Take 1 tablet (800 mg total) by mouth every 8 (eight) hours as needed.   30 tablet   0   . ibuprofen (ADVIL,MOTRIN) 800 MG tablet   Oral   Take 1 tablet (800 mg total) by mouth every 8 (eight) hours as needed.   30 tablet   0   . naproxen (NAPROSYN) 500 MG tablet   Oral   Take 1 tablet (500 mg total) by mouth 2 (two) times daily with a meal.   20 tablet   0   . traMADol (ULTRAM) 50 MG tablet   Oral   Take 1 tablet (50 mg total) by mouth every 6 (six) hours as needed for  moderate pain.   12 tablet   0     Allergies Review of patient's allergies indicates no known allergies.  No family history on file.  Social History Social History  Substance Use Topics  . Smoking status: Never Smoker   . Smokeless tobacco: Current User    Types: Chew  . Alcohol Use: No    Review of Systems Constitutional: No fever/chills Eyes: No visual changes. ENT: No sore throat. Cardiovascular: Denies chest pain. Respiratory: Denies shortness of breath. Gastrointestinal: No abdominal pain.  No nausea, no vomiting.  No diarrhea.  No constipation. Genitourinary: Negative for dysuria. Musculoskeletal: Wrist pain Skin: Negative for rash. Neurological: Negative for headaches, focal weakness or numbness.   ____________________________________________   PHYSICAL EXAM:  VITAL SIGNS: ED Triage Vitals  Enc Vitals Group     BP 07/06/15 2017 130/83 mmHg     Pulse Rate 07/06/15 2017 92     Resp 07/06/15 2017 18     Temp 07/06/15 2017 98.3 F (36.8 C)     Temp Source 07/06/15 2017 Oral     SpO2 07/06/15 2017 99 %     Weight 07/06/15 2017 150 lb (68.04 kg)     Height 07/06/15 2017 5\' 2"  (  1.575 m)     Head Cir --      Peak Flow --      Pain Score 07/06/15 2018 9     Pain Loc --      Pain Edu? --      Excl. in GC? --     Constitutional: Alert and oriented. Well appearing and in no acute distress. Eyes: Conjunctivae are normal. PERRL. EOMI. Head: Atraumatic. Nose: No congestion/rhinnorhea. Mouth/Throat: Mucous membranes are moist.  Oropharynx non-erythematous. Neck: No stridor.  No cervical spine tenderness to palpation. Hematological/Lymphatic/Immunilogical: No cervical lymphadenopathy. Cardiovascular: Normal rate, regular rhythm. Grossly normal heart sounds.  Good peripheral circulation. Respiratory: Normal respiratory effort.  No retractions. Lungs CTAB. Gastrointestinal: Soft and nontender. No distention. No abdominal bruits. No CVA  tenderness. Musculoskeletal: No lower extremity tenderness nor edema.  No joint effusions. Neurologic:  Normal speech and language. No gross focal neurologic deficits are appreciated. No gait instability. Skin:  Skin is warm, dry and intact. No rash noted. Psychiatric: Mood and affect are normal. Speech and behavior are normal.  ____________________________________________   LABS (all labs ordered are listed, but only abnormal results are displayed)  Labs Reviewed - No data to display ____________________________________________  EKG   ____________________________________________  RADIOLOGY  No acute findings ____________________________________________   PROCEDURES  Procedure(s) performed: None  Critical Care performed: No  ____________________________________________   INITIAL IMPRESSION / ASSESSMENT AND PLAN / ED COURSE  Pertinent labs & imaging results that were available during my care of the patient were reviewed by me and considered in my medical decision making (see chart for details).  Right wrist tendinitis secondary to repetitive motion injury. Discussed x-ray results with patient. Patient placed in the Velcro wrist splint and advised to wear for 3-5 days. Patient given a prescription for naproxen to take as directed. Patient advised to follow-up with [facility] if condition persists.   FINAL CLINICAL IMPRESSION(S) / ED DIAGNOSES  Final diagnoses:  Tendinitis of right wrist  Repetitive motion injury      Joni Reining, PA-C 07/06/15 2204  Sharyn Creamer, MD 07/06/15 [phone]

## 2015-07-06 NOTE — Discharge Instructions (Signed)
wear splint for 3-5 days.

## 2015-07-06 NOTE — ED Notes (Signed)
Patient presents to the ED with right wrist pain that began while at work today.  Patient reports repetitive movements.  Patient states he works at [employer] but does not think he wants to file workman's comp. At this time.  Patient is in no obvious distress at this time.

## 2015-07-06 NOTE — ED Notes (Signed)
Reviewed d/c instructions, follow-up care, prescriptions, and use of ice/elevation with pt. Pt verbalized understanding.  

## 2015-07-11 ENCOUNTER — Encounter: Payer: Self-pay | Admitting: Emergency Medicine

## 2015-07-11 ENCOUNTER — Emergency Department
Admission: EM | Admit: 2015-07-11 | Discharge: 2015-07-11 | Disposition: A | Discharge status: Home / Self Care | Payer: Self-pay | Attending: Emergency Medicine | Admitting: Emergency Medicine

## 2015-07-11 DIAGNOSIS — G5601 Carpal tunnel syndrome, right upper limb: Secondary | ICD-10-CM | POA: Insufficient documentation

## 2015-07-11 DIAGNOSIS — N400 Enlarged prostate without lower urinary tract symptoms: Secondary | ICD-10-CM | POA: Insufficient documentation

## 2015-07-11 DIAGNOSIS — F329 Major depressive disorder, single episode, unspecified: Secondary | ICD-10-CM | POA: Insufficient documentation

## 2015-07-11 HISTORY — DX: Major depressive disorder, single episode, unspecified: F32.9

## 2015-07-11 HISTORY — DX: Depression, unspecified: F32.A

## 2015-07-11 MED ORDER — TRAMADOL HCL 50 MG PO TABS: 50.0000 mg | ORAL_TABLET | Freq: Four times a day (QID) | ORAL | Status: DC | PRN

## 2015-07-11 NOTE — ED Provider Notes (Signed)
[facility] Emergency Department Provider Note  ____________________________________________  Time seen: Approximately 7:28 PM  I have reviewed the triage vital signs and the nursing notes.   HISTORY  Chief Complaint Hand Pain    HPI Ian Munoz is a 35 y.o. male who presents emergency department complaining of continued pain to the right hand. Patient was seen in this department on 07/06/2015 and diagnosed with repetitive motion injury and tendinitis. Patient states that he has been using the wrist brace intermittently and take the anti-inflammatories. He states this morning he noticed some numbness and tingling in the third, fourth, fifth digits of the right hand. Patient denies any loss of range of motion to hand. He denies any other injury. He states numbness and tingling is constant, moderate.   Past Medical History  Diagnosis Date  . Prostate enlargement   . Depression     There are no active problems to display for this patient.   Past Surgical History  Procedure Laterality Date  . Wrist surgery Right   . Eye surgery      Current Outpatient Rx  Name  Route  Sig  Dispense  Refill  . cyclobenzaprine (FLEXERIL) 10 MG tablet   Oral   Take 1 tablet (10 mg total) by mouth every 8 (eight) hours as needed for muscle spasms.   30 tablet   1   . cyclobenzaprine (FLEXERIL) 10 MG tablet   Oral   Take 1 tablet (10 mg total) by mouth every 8 (eight) hours as needed for muscle spasms.   30 tablet   1   . ibuprofen (ADVIL,MOTRIN) 800 MG tablet   Oral   Take 1 tablet (800 mg total) by mouth every 8 (eight) hours as needed.   30 tablet   0   . ibuprofen (ADVIL,MOTRIN) 800 MG tablet   Oral   Take 1 tablet (800 mg total) by mouth every 8 (eight) hours as needed.   30 tablet   0   . naproxen (NAPROSYN) 500 MG tablet   Oral   Take 1 tablet (500 mg total) by mouth 2 (two) times daily with a meal.   20 tablet   0   . traMADol (ULTRAM) 50 MG  tablet   Oral   Take 1 tablet (50 mg total) by mouth every 6 (six) hours as needed.   20 tablet   0     Allergies Review of patient's allergies indicates no known allergies.  No family history on file.  Social History Social History  Substance Use Topics  . Smoking status: Never Smoker   . Smokeless tobacco: Current User    Types: Chew  . Alcohol Use: No     Review of Systems  Musculoskeletal:Right wrist pain. Skin: Negative for rash. Neurological: Negative foPositive for numbness and tingling in the third, fourth, fifth digits of the right hand.0-point ROS otherwise negative.  ____________________________________________   PHYSICAL EXAM:  VITAL SIGNS: ED Triage Vitals  Enc Vitals Group     BP 07/11/15 1755 122/72 mmHg     Pulse Rate 07/11/15 1755 87     Resp 07/11/15 1755 18     Temp 07/11/15 1755 97.9 F (36.6 C)     Temp Source 07/11/15 1755 Oral     SpO2 07/11/15 1755 100 %     Weight 07/11/15 1755 150 lb (68.04 kg)     Height 07/11/15 1755 5\' 2"  (1.575 m)     Head Cir --  Peak Flow --      Pain Score 07/11/15 1756 7     Pain Loc --      Pain Edu? --      Excl. in GC? --       Constitutional: Alert and oriented. Well appearing and in no acute distress. Hematological/Lymphatic/Immunilogical: No cervical lymphadenopathy. Cardiovascular: Normal rate, regular rhythm. Normal S1 and S2.  Good peripheral circulation. Respiratory: Normal respiratory effort without tachypnea or retractions. Lungs CTAB. Musculoskeletal: full range of motion to right wrist when compared to left. no visible abnormality. Radial pulse intact. Sensation intact times all digits.  Positive Tinel's and Phalen's. Neurologic:  Normal speech and language. No gross focal neurologic deficits are appreciated.  Skin:  Skin is warm, dry and intact. No rash noted. Psychiatric: Mood and affect are normal. Speech and behavior are normal. Patient exhibits appropriate insight and  judgement.   ____________________________________________   LABS (all labs ordered are listed, but only abnormal results are displayed)  Labs Reviewed - No data to display ____________________________________________  EKG   ____________________________________________  RADIOLOGY   No results found.  ____________________________________________    PROCEDURES  Procedure(s) performed:       Medications - No data to display   ____________________________________________   INITIAL IMPRESSION / ASSESSMENT AND PLAN / ED COURSE  Pertinent labs & imaging results that were available during my care of the patient were reviewed by me and considered in my medical decision making (see chart for details).  Patient's diagnosis is consistent witcarpal tunnel syndrome. Patient has been treated with anti-inflammatories and intermittent use of wrist brace. Symptoms have increased from previous visit. At this time patient will be diagnosed with carpal tunnel syndrome. He is referred to orthopedics for further evaluation and treatment. Patient is to continue his oral anti-inflammatories and continue use of wrist brace.. Patient will be discharged home with prescriptiotramadol for breakthrough pain. Patient is to follow up with  orthopedics. Patient is given ED precautions to return to the ED for any worsening or new symptoms.     ____________________________________________  FINAL CLINICAL IMPRESSION(S) / ED DIAGNOSES  Final diagnoses:  Carpal tunnel syndrome of right wrist      NEW MEDICATIONS STARTED DURING THIS VISIT:  New Prescriptions   TRAMADOL (ULTRAM) 50 MG TABLET    Take 1 tablet (50 mg total) by mouth every 6 (six) hours as needed.        This chart was dictated using voice recognition software/Dragon. Despite best efforts to proofread, errors can occur which can change the meaning. Any change was purely unintentional.    Racheal Patches,  PA-C 07/11/15 1942  Jene Every, MD 07/11/15 [phone]

## 2015-07-11 NOTE — ED Notes (Signed)
Right hand pain , numbness, tightness, was sen on the 18th with repetitive strain injury

## 2015-07-11 NOTE — Discharge Instructions (Signed)

## 2015-07-26 ENCOUNTER — Encounter: Payer: Self-pay | Admitting: Emergency Medicine

## 2015-07-26 DIAGNOSIS — Y939 Activity, unspecified: Secondary | ICD-10-CM | POA: Insufficient documentation

## 2015-07-26 DIAGNOSIS — Y999 Unspecified external cause status: Secondary | ICD-10-CM | POA: Insufficient documentation

## 2015-07-26 DIAGNOSIS — Y929 Unspecified place or not applicable: Secondary | ICD-10-CM | POA: Insufficient documentation

## 2015-07-26 DIAGNOSIS — F329 Major depressive disorder, single episode, unspecified: Secondary | ICD-10-CM | POA: Insufficient documentation

## 2015-07-26 DIAGNOSIS — L03312 Cellulitis of back [any part except buttock]: Secondary | ICD-10-CM | POA: Insufficient documentation

## 2015-07-26 DIAGNOSIS — W57XXXA Bitten or stung by nonvenomous insect and other nonvenomous arthropods, initial encounter: Secondary | ICD-10-CM | POA: Insufficient documentation

## 2015-07-26 DIAGNOSIS — R51 Headache: Secondary | ICD-10-CM | POA: Insufficient documentation

## 2015-07-26 LAB — COMPREHENSIVE METABOLIC PANEL
ALBUMIN: 4.5 g/dL (ref 3.5–5.0)
ALK PHOS: 97 U/L (ref 38–126)
ALT: 18 U/L (ref 17–63)
AST: 17 U/L (ref 15–41)
Anion gap: 3 — ABNORMAL LOW (ref 5–15)
BILIRUBIN TOTAL: 0.5 mg/dL (ref 0.3–1.2)
BUN: 14 mg/dL (ref 6–20)
CALCIUM: 9.4 mg/dL (ref 8.9–10.3)
CO2: 29 mmol/L (ref 22–32)
CREATININE: 0.93 mg/dL (ref 0.61–1.24)
Chloride: 102 mmol/L (ref 101–111)
GFR calc Af Amer: >60 mL/min (ref >60)
GFR calc non Af Amer: >60 mL/min (ref >60)
GLUCOSE: 97 mg/dL (ref 65–99)
Potassium: 3.8 mmol/L (ref 3.5–5.1)
SODIUM: 134 mmol/L — AB (ref 135–145)
TOTAL PROTEIN: 7.8 g/dL (ref 6.5–8.1)

## 2015-07-26 LAB — CBC WITH DIFFERENTIAL/PLATELET
BASOS PCT: 0 %
Basophils Absolute: 0 10*3/uL (ref 0–0.1)
EOS ABS: 0.1 10*3/uL (ref 0–0.7)
EOS PCT: 1 %
HCT: 44 % (ref 40.0–52.0)
Hemoglobin: 15 g/dL (ref 13.0–18.0)
Lymphocytes Relative: 29 %
Lymphs Abs: 2.7 10*3/uL (ref 1.0–3.6)
MCH: 33.2 pg (ref 26.0–34.0)
MCHC: 34 g/dL (ref 32.0–36.0)
MCV: 97.4 fL (ref 80.0–100.0)
MONO ABS: 0.7 10*3/uL (ref 0.2–1.0)
MONOS PCT: 8 %
Neutro Abs: 5.8 10*3/uL (ref 1.4–6.5)
Neutrophils Relative %: 62 %
PLATELETS: 258 10*3/uL (ref 150–440)
RBC: 4.52 MIL/uL (ref 4.40–5.90)
RDW: 12.5 % (ref 11.5–14.5)
WBC: 9.4 10*3/uL (ref 3.8–10.6)

## 2015-07-26 NOTE — ED Notes (Addendum)
Patient to ER for c/o itchy spot to lower back. States he noticed area when he woke up from a nap and felt temporarily disoriented upon waking. Patient in no acute distress currently. States he saw spider in house last night that looked like brown recluse. Patient has area approx 3-4 inches in diameter to left lumbar area that is red, warm to touch, and swollen.

## 2015-07-27 ENCOUNTER — Emergency Department
Admission: EM | Admit: 2015-07-27 | Discharge: 2015-07-27 | Disposition: A | Discharge status: Home / Self Care | Payer: Self-pay | Attending: Emergency Medicine | Admitting: Emergency Medicine

## 2015-07-27 DIAGNOSIS — T63304A Toxic effect of unspecified spider venom, undetermined, initial encounter: Secondary | ICD-10-CM

## 2015-07-27 DIAGNOSIS — L03312 Cellulitis of back [any part except buttock]: Secondary | ICD-10-CM

## 2015-07-27 MED ORDER — CLINDAMYCIN HCL 150 MG PO CAPS
300.0000 mg | ORAL_CAPSULE | Freq: Once | ORAL | Status: AC
  Administered 2015-07-27: 300 mg via ORAL
  Filled 2015-07-27: qty 2

## 2015-07-27 MED ORDER — TRAMADOL HCL 50 MG PO TABS
50.0000 mg | ORAL_TABLET | Freq: Once | ORAL | Status: AC
  Administered 2015-07-27: 50 mg via ORAL
  Filled 2015-07-27: qty 1

## 2015-07-27 MED ORDER — CLINDAMYCIN HCL 300 MG PO CAPS: 300.0000 mg | ORAL_CAPSULE | Freq: Three times a day (TID) | ORAL | Status: DC

## 2015-07-27 MED ORDER — TRAMADOL HCL 50 MG PO TABS
50.0000 mg | ORAL_TABLET | Freq: Four times a day (QID) | ORAL | Status: DC | PRN
Start: 2015-07-27 — End: 2015-11-13

## 2015-07-27 NOTE — Discharge Instructions (Signed)
Cellulitis Cellulitis is an infection of the skin and the tissue beneath it. The infected area is usually red and tender. Cellulitis occurs most often in the arms and lower legs.  CAUSES  Cellulitis is caused by bacteria that enter the skin through cracks or cuts in the skin. The most common types of bacteria that cause cellulitis are staphylococci and streptococci. SIGNS AND SYMPTOMS   Redness and warmth.  Swelling.  Tenderness or pain.  Fever. DIAGNOSIS  Your health care provider can usually determine what is wrong based on a physical exam. Blood tests may also be done. TREATMENT  Treatment usually involves taking an antibiotic medicine. HOME CARE INSTRUCTIONS   Take your antibiotic medicine as directed by your health care provider. Finish the antibiotic even if you start to feel better.  Keep the infected arm or leg elevated to reduce swelling.  Apply a warm cloth to the affected area up to 4 times per day to relieve pain.  Take medicines only as directed by your health care provider.  Keep all follow-up visits as directed by your health care provider. SEEK MEDICAL CARE IF:   You notice red streaks coming from the infected area.  Your red area gets larger or turns dark in color.  Your bone or joint underneath the infected area becomes painful after the skin has healed.  Your infection returns in the same area or another area.  You notice a swollen bump in the infected area.  You develop new symptoms.  You have a fever. SEEK IMMEDIATE MEDICAL CARE IF:   You feel very sleepy.  You develop vomiting or diarrhea.  You have a general ill feeling (malaise) with muscle aches and pains.   This information is not intended to replace advice given to you by your health care provider. Make sure you discuss any questions you have with your health care provider.   Document Released: 01/14/2005 Document Revised: 12/26/2014 Document Reviewed: 06/22/2011 Elsevier Interactive  Patient Education 2016 [employer]  Spider Bite Spider bites are not common. When spider bites do happen, most do not cause serious health problems. There are only a few types of spider bites that can cause serious health problems. CAUSES A spider bite usually happens when a person accidentally makes contact with a spider in a way that traps the spider against the person's skin. SYMPTOMS Symptoms may vary depending on the type of spider. Some spider bites may cause symptoms within 1 hour after the bite. For other spider bites, it may take 1-2 days for symptoms to develop. Common symptoms include:  Redness and swelling in the area of the bite.  Discomfort or pain in the area of the bite. A few types of spiders, such as the black widow spider or the brown recluse spider, can inject poison (venom) into a bite wound. This venom causes more serious symptoms. Symptoms of a venomous spider bite vary, and may include:  Muscle cramps.  Nausea, vomiting, or abdominal pain.  Fever.  A skin sore (lesion) that spreads. This can break into an open wound (skin ulcer).  Light-headedness or dizziness. DIAGNOSIS This condition may be diagnosed based on your symptoms and a physical exam. Your health care provider will ask about the history of your injury and any details you may have about the spider. This may help to determine what type of spider it was that bit you. TREATMENT Many spider bites do not require treatment. If needed, treatment may include:  Icing and keeping the bite  area raised (elevated).  Over-the-counter or prescription medicines to help control symptoms.  A tetanus shot.  Antibiotic medicines. HOME CARE INSTRUCTIONS Medicines  Take or apply over-the-counter and prescription medicines only as told by your health care provider.  If you were prescribed an antibiotic medicine, take or apply it as told by your health care provider. Do not stop using the antibiotic even if your  condition improves. General Instructions  Do not scratch the bite area.  Keep the bite area clean and dry. Wash the bite area daily with soap and water as told by your health care provider.  If directed, apply ice to the bite area.  Put ice in a plastic bag.  Place a towel between your skin and the bag.  Leave the ice on for 20 minutes, 2-3 times per day.  Elevate the affected area above the level of your heart while you are sitting or lying down, if possible.  Keep all follow-up visits as told by your health care provider. This is important. SEEK MEDICAL CARE IF:  Your bite does not get better after 3 days of treatment.  Your bite turns black or purple.  You have increased redness, swelling, or pain at the site of the bite. SEEK IMMEDIATE MEDICAL CARE IF:  You develop shortness of breath or chest pain.  You have fluid, blood, or pus coming from the bite area.  You have muscle cramps or painful muscle spasms.  You develop abdominal pain, nausea, or vomiting.  You feel unusually tired (fatigued) or sleepy.   This information is not intended to replace advice given to you by your health care provider. Make sure you discuss any questions you have with your health care provider.   Document Released: 05/14/2004 Document Revised: 12/26/2014 Document Reviewed: 08/22/2014 Elsevier Interactive Patient Education [employer]2016 Elsevier Inc.

## 2015-07-27 NOTE — ED Notes (Signed)
MD at bedside. 

## 2015-07-27 NOTE — ED Provider Notes (Signed)
[facility] Emergency Department Provider Note  ____________________________________________  Time seen: Approximately 2:00 AM  I have reviewed the triage vital signs and the nursing notes.   HISTORY  Chief Complaint Insect Bite    HPI Ian Munoz is a 35 y.o. male who comes into the hospital today with a spider bite. The patient and his significant other are pretty sure that it was a spider bite. They report that they're concerned it was a brown recluse spider. The patient was bitten on his lower back and he reports that it itches and hurts. The family reports that the redness is spreading. It started out about X-shaped and now looks much bigger. The patient has had some chills with weakness and nausea. He reports that he does have a headache as well. He has not taken anything for the pain and he reports his pain is 8 out of 10 in intensity. The patient also has not had any fevers. The family was concerned given the spider bite so they decided to bring him into the hospital.   Past Medical History  Diagnosis Date  . Prostate enlargement   . Depression     There are no active problems to display for this patient.   Past Surgical History  Procedure Laterality Date  . Wrist surgery Right   . Eye surgery      Current Outpatient Rx  Name  Route  Sig  Dispense  Refill  . clindamycin (CLEOCIN) 300 MG capsule   Oral   Take 1 capsule (300 mg total) by mouth 3 (three) times daily.   30 capsule   0   . cyclobenzaprine (FLEXERIL) 10 MG tablet   Oral   Take 1 tablet (10 mg total) by mouth every 8 (eight) hours as needed for muscle spasms.   30 tablet   1   . cyclobenzaprine (FLEXERIL) 10 MG tablet   Oral   Take 1 tablet (10 mg total) by mouth every 8 (eight) hours as needed for muscle spasms.   30 tablet   1   . ibuprofen (ADVIL,MOTRIN) 800 MG tablet   Oral   Take 1 tablet (800 mg total) by mouth every 8 (eight) hours as needed.   30 tablet  0   . ibuprofen (ADVIL,MOTRIN) 800 MG tablet   Oral   Take 1 tablet (800 mg total) by mouth every 8 (eight) hours as needed.   30 tablet   0   . naproxen (NAPROSYN) 500 MG tablet   Oral   Take 1 tablet (500 mg total) by mouth 2 (two) times daily with a meal.   20 tablet   0   . traMADol (ULTRAM) 50 MG tablet   Oral   Take 1 tablet (50 mg total) by mouth every 6 (six) hours as needed.   20 tablet   0   . traMADol (ULTRAM) 50 MG tablet   Oral   Take 1 tablet (50 mg total) by mouth every 6 (six) hours as needed.   12 tablet   0     Allergies Review of patient's allergies indicates no known allergies.  No family history on file.  Social History Social History  Substance Use Topics  . Smoking status: Never Smoker   . Smokeless tobacco: Current User    Types: Chew  . Alcohol Use: No    Review of Systems Constitutional: chills Eyes: No visual changes. ENT: No sore throat. Cardiovascular: Denies chest pain. Respiratory: Denies shortness of  breath. Gastrointestinal: nausea, no vomiting.  No diarrhea.  No constipation. Genitourinary: Negative for dysuria. Musculoskeletal: Negative for back pain. Skin: Redness to back. Neurological: Headache  10-point ROS otherwise negative.  ____________________________________________   PHYSICAL EXAM:  VITAL SIGNS: ED Triage Vitals  Enc Vitals Group     BP 07/26/15 2204 112/75 mmHg     Pulse Rate 07/26/15 2204 91     Resp 07/26/15 2204 16     Temp 07/26/15 2204 98.4 F (36.9 C)     Temp Source 07/26/15 2204 Oral     SpO2 07/26/15 2204 100 %     Weight 07/26/15 2204 160 lb (72.576 kg)     Height 07/26/15 2204 5\' 2"  (1.575 m)     Head Cir --      Peak Flow --      Pain Score 07/26/15 2207 4     Pain Loc --      Pain Edu? --      Excl. in GC? --     Constitutional: Alert and oriented. Well appearing and in mild distress. Eyes: Conjunctivae are normal. PERRL. EOMI. Head: Atraumatic. Nose: No  congestion/rhinnorhea. Mouth/Throat: Mucous membranes are moist.  Oropharynx non-erythematous. Cardiovascular: Normal rate, regular rhythm. Grossly normal heart sounds.  Good peripheral circulation. Respiratory: Normal respiratory effort.  No retractions. Lungs CTAB. Gastrointestinal: Soft and nontender. No distention. Positive bowel sounds Musculoskeletal: No lower extremity tenderness nor edema.   Neurologic:  Normal speech and language. Skin:  Redness and warmth to left lower back with some tenderness to palpation no abscess or induration noted.  Psychiatric: Mood and affect are normal.   ____________________________________________   LABS (all labs ordered are listed, but only abnormal results are displayed)  Labs Reviewed  COMPREHENSIVE METABOLIC PANEL - Abnormal; Notable for the following:    Sodium 134 (*)    Anion gap 3 (*)    All other components within normal limits  CBC WITH DIFFERENTIAL/PLATELET   ____________________________________________  EKG  None ____________________________________________  RADIOLOGY  None ____________________________________________   PROCEDURES  Procedure(s) performed: None  Critical Care performed: No  ____________________________________________   INITIAL IMPRESSION / ASSESSMENT AND PLAN / ED COURSE  Pertinent labs & imaging results that were available during my care of the patient were reviewed by me and considered in my medical decision making (see chart for details).  This is a 35 year old male who comes into the hospital today with some redness to his back with a concern for infection after a spider bite. The patient did have some blood work done and it was negative but it appears as though he does have some cellulitis. The patient does not have an abscess at this time. I will give the patient a dose of tramadol as well as a dose of clindamycin. I will discharge the patient home and informed the patient and his significant  other to continue to monitor the area. Should the redness continued to spread or he have any worsening symptoms she should come back to the hospital for admission and IV antibiotics. ____________________________________________   FINAL CLINICAL IMPRESSION(S) / ED DIAGNOSES  Final diagnoses:  Cellulitis of back except buttock  Spider bite, undetermined intent, initial encounter      Rebecka ApleyAllison P Avana Kreiser, MD 07/27/15 [phone]

## 2015-08-05 ENCOUNTER — Emergency Department
Admission: EM | Admit: 2015-08-05 | Discharge: 2015-08-05 | Disposition: A | Discharge status: Home / Self Care | Payer: Self-pay | Attending: Emergency Medicine | Admitting: Emergency Medicine

## 2015-08-05 ENCOUNTER — Encounter: Payer: Self-pay | Admitting: Emergency Medicine

## 2015-08-05 DIAGNOSIS — B349 Viral infection, unspecified: Secondary | ICD-10-CM | POA: Insufficient documentation

## 2015-08-05 DIAGNOSIS — R52 Pain, unspecified: Secondary | ICD-10-CM

## 2015-08-05 DIAGNOSIS — F329 Major depressive disorder, single episode, unspecified: Secondary | ICD-10-CM | POA: Insufficient documentation

## 2015-08-05 DIAGNOSIS — R110 Nausea: Secondary | ICD-10-CM

## 2015-08-05 LAB — URINALYSIS COMPLETE WITH MICROSCOPIC (ARMC ONLY)
BILIRUBIN URINE: NEGATIVE
Bacteria, UA: NONE SEEN
Glucose, UA: NEGATIVE mg/dL
Hgb urine dipstick: NEGATIVE
KETONES UR: NEGATIVE mg/dL
LEUKOCYTES UA: NEGATIVE
NITRITE: NEGATIVE
PH: 6 (ref 5.0–8.0)
PROTEIN: NEGATIVE mg/dL
SPECIFIC GRAVITY, URINE: 1.003 — AB (ref 1.005–1.030)
SQUAMOUS EPITHELIAL / LPF: NONE SEEN
WBC, UA: NONE SEEN WBC/hpf (ref 0–5)

## 2015-08-05 LAB — CBC WITH DIFFERENTIAL/PLATELET
Basophils Absolute: 0 10*3/uL (ref 0–0.1)
Basophils Relative: 0 %
EOS PCT: 2 %
Eosinophils Absolute: 0.1 10*3/uL (ref 0–0.7)
HCT: 42.4 % (ref 40.0–52.0)
Hemoglobin: 14.4 g/dL (ref 13.0–18.0)
LYMPHS ABS: 0.8 10*3/uL — AB (ref 1.0–3.6)
LYMPHS PCT: 22 %
MCH: 33.1 pg (ref 26.0–34.0)
MCHC: 33.9 g/dL (ref 32.0–36.0)
MCV: 97.6 fL (ref 80.0–100.0)
MONO ABS: 0.3 10*3/uL (ref 0.2–1.0)
MONOS PCT: 10 %
Neutro Abs: 2.4 10*3/uL (ref 1.4–6.5)
Neutrophils Relative %: 66 %
PLATELETS: 162 10*3/uL (ref 150–440)
RBC: 4.35 MIL/uL — AB (ref 4.40–5.90)
RDW: 12.9 % (ref 11.5–14.5)
WBC: 3.6 10*3/uL — AB (ref 3.8–10.6)

## 2015-08-05 LAB — COMPREHENSIVE METABOLIC PANEL
ALT: 25 U/L (ref 17–63)
ANION GAP: 7 (ref 5–15)
AST: 25 U/L (ref 15–41)
Albumin: 4.2 g/dL (ref 3.5–5.0)
Alkaline Phosphatase: 88 U/L (ref 38–126)
BILIRUBIN TOTAL: 0.5 mg/dL (ref 0.3–1.2)
BUN: 6 mg/dL (ref 6–20)
CHLORIDE: 102 mmol/L (ref 101–111)
CO2: 23 mmol/L (ref 22–32)
Calcium: 8.8 mg/dL — ABNORMAL LOW (ref 8.9–10.3)
Creatinine, Ser: 1.02 mg/dL (ref 0.61–1.24)
Glucose, Bld: 95 mg/dL (ref 65–99)
POTASSIUM: 4 mmol/L (ref 3.5–5.1)
Sodium: 132 mmol/L — ABNORMAL LOW (ref 135–145)
TOTAL PROTEIN: 7.5 g/dL (ref 6.5–8.1)

## 2015-08-05 LAB — ETHANOL

## 2015-08-05 MED ORDER — SODIUM CHLORIDE 0.9 % IV BOLUS (SEPSIS)
1000.0000 mL | Freq: Once | INTRAVENOUS | Status: AC
  Administered 2015-08-05: 1000 mL via INTRAVENOUS

## 2015-08-05 MED ORDER — KETOROLAC TROMETHAMINE 30 MG/ML IJ SOLN
30.0000 mg | Freq: Once | INTRAMUSCULAR | Status: AC
  Administered 2015-08-05: 30 mg via INTRAVENOUS

## 2015-08-05 MED ORDER — KETOROLAC TROMETHAMINE 30 MG/ML IJ SOLN
INTRAMUSCULAR | Status: AC
  Filled 2015-08-05: qty 1

## 2015-08-05 NOTE — ED Notes (Signed)
Pt presents to ED with fever and nausea since this morning. Pt states approx a week ago and treated for cellulitis on his back with oral antibiotics. Pt states he thinks he was bitten by a spider. Pt has taken his antibiotic as prescribed and feels that the area to his back is worsening and "turning black". Area has been itching and his wife has been applying anti-itch cream and taking a benadryl daily as well. Area is raised and purple in color at this time. Pt reports he has generalized body aches. Pt alert and calm at this time. No distress noted.

## 2015-08-05 NOTE — ED Provider Notes (Addendum)
[facility] Emergency Department Provider Note  ____________________________________________   I have reviewed the triage vital signs and the nursing notes.   HISTORY  Chief Complaint Fever and Nausea    HPI Ian Munoz is a 35 y.o. male who was seen here 9 days ago for a very localized cellulitis, received antibiotics. Review of notes suggest that he also was intoxicated that day and went to [facility] where he was also seen for his small cellulitis and where he apparently in the state of inebriation made homicidal threats which she recanted after sobriety was attained. Patient states that he has been using the antibiotics and the area is somewhat itchy but over the last few hours he has been feeling a little bit lightheaded and he has some myalgia. This is why he came in. He denies any actual fever. After waiting in the waiting room for 20 minutes he then stated that he was having a host of other complaints which he did not initially mention including double vision which she denies to me nausea no vomiting, slight cough with no significant shortness of breath, pain with cough. In feeling lightheaded.   Past Medical History  Diagnosis Date  . Prostate enlargement   . Depression     There are no active problems to display for this patient.   Past Surgical History  Procedure Laterality Date  . Wrist surgery Right   . Eye surgery      Current Outpatient Rx  Name  Route  Sig  Dispense  Refill  . clindamycin (CLEOCIN) 300 MG capsule   Oral   Take 1 capsule (300 mg total) by mouth 3 (three) times daily.   30 capsule   0   . cyclobenzaprine (FLEXERIL) 10 MG tablet   Oral   Take 1 tablet (10 mg total) by mouth every 8 (eight) hours as needed for muscle spasms.   30 tablet   1   . cyclobenzaprine (FLEXERIL) 10 MG tablet   Oral   Take 1 tablet (10 mg total) by mouth every 8 (eight) hours as needed for muscle spasms.   30 tablet   1   .  ibuprofen (ADVIL,MOTRIN) 800 MG tablet   Oral   Take 1 tablet (800 mg total) by mouth every 8 (eight) hours as needed.   30 tablet   0   . ibuprofen (ADVIL,MOTRIN) 800 MG tablet   Oral   Take 1 tablet (800 mg total) by mouth every 8 (eight) hours as needed.   30 tablet   0   . naproxen (NAPROSYN) 500 MG tablet   Oral   Take 1 tablet (500 mg total) by mouth 2 (two) times daily with a meal.   20 tablet   0   . traMADol (ULTRAM) 50 MG tablet   Oral   Take 1 tablet (50 mg total) by mouth every 6 (six) hours as needed.   20 tablet   0   . traMADol (ULTRAM) 50 MG tablet   Oral   Take 1 tablet (50 mg total) by mouth every 6 (six) hours as needed.   12 tablet   0     Allergies Review of patient's allergies indicates no known allergies.  No family history on file.  Social History Social History  Substance Use Topics  . Smoking status: Never Smoker   . Smokeless tobacco: Current User    Types: Chew  . Alcohol Use: No    Review of Systems  Constitutional: A objective fevers Eyes: No visual changes. ENT: No sore throat. No stiff neck no neck pain Cardiovascular: Denies chest pain. Respiratory: Denies shortness of breath. Positive mild cough Gastrointestinal:   no vomiting.  No diarrhea.  No constipation. Genitourinary: Negative for dysuria. Musculoskeletal: Negative lower extremity swelling Skin: Still feels irritation in the area of the "spider bite" Neurological: Negative for headaches, focal weakness or numbness. 10-point ROS otherwise negative.  ____________________________________________   PHYSICAL EXAM:  VITAL SIGNS: ED Triage Vitals  Enc Vitals Group     BP 08/05/15 2029 123/72 mmHg     Pulse Rate 08/05/15 2029 117     Resp 08/05/15 2029 20     Temp 08/05/15 2029 99.8 F (37.7 C)     Temp Source 08/05/15 2029 Oral     SpO2 08/05/15 2029 99 %     Weight 08/05/15 2029 160 lb (72.576 kg)     Height 08/05/15 2029 5\' 2"  (1.575 m)     Head Cir --       Peak Flow --      Pain Score 08/05/15 2029 8     Pain Loc --      Pain Edu? --      Excl. in GC? --     Constitutional: Alert and oriented. Well appearing and in no acute distress., Well-appearing Eyes: Conjunctivae are normal. PERRL. EOMI. Head: Atraumatic. Nose: No congestion/rhinnorhea. Mouth/Throat: Mucous membranes are moist.  Oropharynx non-erythematous. Neck: No stridor.   Nontender with no meningismus Cardiovascular: Normal rate, regular rhythm. Grossly normal heart sounds.  Good peripheral circulation. Respiratory: Normal respiratory effort.  No retractions. Lungs CTAB. Abdominal: Soft and nontender. No distention. No guarding no rebound Back:  There is no focal tenderness or step off there is no midline tenderness there are no lesions noted. there is no CVA tenderness Musculoskeletal: No lower extremity tenderness. No joint effusions, no DVT signs strong distal pulses no edema Neurologic:  Normal speech and language. No gross focal neurologic deficits are appreciated.  Skin:  Skin is warm, dry and intact. The area of concern to his back is approximately 4 x 6 cm of faintly erythematous skin which does not appear to be acutely infected. It is not Markley tender there is no abscess noted, is a very pale red isn't consistent with a resolved cellulitis and fact. There is no induration, there is no redness emanating from it is not hot to touch it is just slightly red skin looks irritated. Psychiatric: Mood and affect are normal. Speech and behavior are normal.  ____________________________________________   LABS (all labs ordered are listed, but only abnormal results are displayed)  Labs Reviewed  CBC WITH DIFFERENTIAL/PLATELET - Abnormal; Notable for the following:    WBC 3.6 (*)    RBC 4.35 (*)    Lymphs Abs 0.8 (*)    All other components within normal limits  COMPREHENSIVE METABOLIC PANEL  ETHANOL  URINALYSIS COMPLETEWITH MICROSCOPIC ([facility] ONLY)    ____________________________________________  EKG  I personally interpreted any EKGs ordered by me or triage  ____________________________________________  RADIOLOGY  I reviewed any imaging ordered by me or triage that were performed during my shift and, if possible, patient and/or family made aware of any abnormal findings. ____________________________________________   PROCEDURES  Procedure(s) performed: None  Critical Care performed: None  ____________________________________________   INITIAL IMPRESSION / ASSESSMENT AND PLAN / ED COURSE  Pertinent labs & imaging results that were available during my care of the patient were reviewed by  me and considered in my medical decision making (see chart for details).  Patient here for feeling generally unwell, does not appear likely source from the small area of erythema to his low back, nor is there any evidence of acute neurologic dysfunction. The patient has no other complaints at this time. Given his multiple different complaints, we are giving him IV fluid checking blood work and reassessing. Patient has a history of somewhat erratic behavior recently but no SI or HI is expressed today  ----------------------------------------- 10:54 PM on 08/05/2015 -----------------------------------------  She remains very well-appearing with reassuring vital signs, his platelet right now is that his whole body hurts. However he does not appear to be in any distress. White count is very slightly low, this is certainly could be consistent with a viral syndrome. He is afebrile here, he has myalgias consistent with a possible early influenza-like syndrome. Certainly does not appear to be septic. Very low suspicion of bacteremia. Again the area that he was concerned about does not appear to be an active infection at this time. We will give him medication for his overall body pain and reassess. He does not complain of a headache or stiff neck.  Nothing to suggest meningitis. Belly is benign nothing to suggest appendicitis lungs are clear nothing to suggest pneumonia, no cough and his sats are 99%.  ----------------------------------------- 11:14 PM on 08/05/2015 -----------------------------------------  Patient and family are very angry and confrontational, is not exactly clear why. Patient does state he has has this flu exposure at work and I suspect this may be what's going on. He does not appear to be bacteremic or otherwise ill. Nonetheless I have ordered cultures which we will obtain. 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 They do admit this but they also state that that was misunderstood and they're very angry and upset. In any event, I do not see any further utility to ER workup at this time, nor do I see any need for empiric antibiotics and given them extensive return precautions and the need to return to the emergency room for any new or worrisome symptoms that you have a primary care doctor they can see tomorrow.  FINAL CLINICAL IMPRESSION(S) / ED DIAGNOSES  Final diagnoses:  None      This chart was dictated using voice recognition software.  Despite best efforts to proofread,  errors can occur which can change meaning.     Jeanmarie Plant, MD 08/05/15 2202  Jeanmarie Plant, MD 08/05/15 1610  Jeanmarie Plant, MD 08/05/15 9604  Jeanmarie Plant, MD 08/05/15 [phone]

## 2015-08-05 NOTE — ED Notes (Signed)
See triage note. Pt presents with new onset of dizziness, chest pain, SOB, double vision and nausea the past 24 hours.

## 2015-08-05 NOTE — Discharge Instructions (Signed)
See your doctor without fail tomorrow morning. If you have high fevers, lethargy or any other new or worrisome symptoms return to the emergency department.

## 2015-08-10 LAB — CULTURE, BLOOD (ROUTINE X 2)
CULTURE: NO GROWTH
Culture: NO GROWTH

## 2015-10-29 ENCOUNTER — Encounter: Payer: Self-pay | Admitting: Emergency Medicine

## 2015-10-29 DIAGNOSIS — Z79899 Other long term (current) drug therapy: Secondary | ICD-10-CM | POA: Insufficient documentation

## 2015-10-29 DIAGNOSIS — Z791 Long term (current) use of non-steroidal anti-inflammatories (NSAID): Secondary | ICD-10-CM | POA: Insufficient documentation

## 2015-10-29 DIAGNOSIS — M940 Chondrocostal junction syndrome [Tietze]: Secondary | ICD-10-CM | POA: Insufficient documentation

## 2015-10-29 DIAGNOSIS — F329 Major depressive disorder, single episode, unspecified: Secondary | ICD-10-CM | POA: Insufficient documentation

## 2015-10-29 DIAGNOSIS — Z792 Long term (current) use of antibiotics: Secondary | ICD-10-CM | POA: Insufficient documentation

## 2015-10-29 NOTE — ED Notes (Signed)
Pateint ambulatory to triage with steady gait, pt c/o mid chest pain since 6 pm tonight. Pt reports was at work when pain began. Pt denies lightheadedness, nausea, vomiting. Pt alert and oriented x 4, no increased work in breathing noted. Pt denies hx of the same in the past.

## 2015-10-30 ENCOUNTER — Emergency Department: Payer: Self-pay

## 2015-10-30 ENCOUNTER — Emergency Department
Admission: EM | Admit: 2015-10-30 | Discharge: 2015-10-30 | Disposition: A | Discharge status: Home / Self Care | Payer: Self-pay | Attending: Emergency Medicine | Admitting: Emergency Medicine

## 2015-10-30 DIAGNOSIS — M940 Chondrocostal junction syndrome [Tietze]: Secondary | ICD-10-CM

## 2015-10-30 LAB — CBC
HCT: 43.1 % (ref 40.0–52.0)
HEMOGLOBIN: 15.1 g/dL (ref 13.0–18.0)
MCH: 33.6 pg (ref 26.0–34.0)
MCHC: 35 g/dL (ref 32.0–36.0)
MCV: 96 fL (ref 80.0–100.0)
PLATELETS: 253 10*3/uL (ref 150–440)
RBC: 4.49 MIL/uL (ref 4.40–5.90)
RDW: 13.2 % (ref 11.5–14.5)
WBC: 8 10*3/uL (ref 3.8–10.6)

## 2015-10-30 LAB — BASIC METABOLIC PANEL
ANION GAP: 6 (ref 5–15)
BUN: 10 mg/dL (ref 6–20)
CALCIUM: 9.3 mg/dL (ref 8.9–10.3)
CHLORIDE: 104 mmol/L (ref 101–111)
CO2: 27 mmol/L (ref 22–32)
Creatinine, Ser: 0.97 mg/dL (ref 0.61–1.24)
GFR calc non Af Amer: >60 mL/min (ref >60)
Glucose, Bld: 90 mg/dL (ref 65–99)
Potassium: 3.7 mmol/L (ref 3.5–5.1)
SODIUM: 137 mmol/L (ref 135–145)

## 2015-10-30 LAB — TROPONIN I: Troponin I: <0.03 ng/mL (ref <0.03)

## 2015-10-30 LAB — FIBRIN DERIVATIVES D-DIMER (ARMC ONLY): FIBRIN DERIVATIVES D-DIMER (ARMC): 149 (ref 0–499)

## 2015-10-30 MED ORDER — KETOROLAC TROMETHAMINE 10 MG PO TABS: 10.0000 mg | ORAL_TABLET | Freq: Three times a day (TID) | ORAL | Status: DC | PRN

## 2015-10-30 MED ORDER — KETOROLAC TROMETHAMINE 10 MG PO TABS: 10.0000 mg | ORAL_TABLET | Freq: Once | ORAL | Status: DC

## 2015-10-30 MED ORDER — KETOROLAC TROMETHAMINE 30 MG/ML IJ SOLN
30.0000 mg | Freq: Once | INTRAMUSCULAR | Status: AC
  Administered 2015-10-30: 30 mg via INTRAVENOUS
  Filled 2015-10-30: qty 1

## 2015-10-30 NOTE — Discharge Instructions (Signed)
Costochondritis °Costochondritis, sometimes called Tietze syndrome, is a swelling and irritation (inflammation) of the tissue (cartilage) that connects your ribs with your breastbone (sternum). It causes pain in the chest and rib area. Costochondritis usually goes away on its own over time. It can take up to 6 weeks or longer to get better, especially if you are unable to limit your activities. °CAUSES  °Some cases of costochondritis have no known cause. Possible causes include: °· Injury (trauma). °· Exercise or activity such as lifting. °· Severe coughing. °SIGNS AND SYMPTOMS °· Pain and tenderness in the chest and rib area. °· Pain that gets worse when coughing or taking deep breaths. °· Pain that gets worse with specific movements. °DIAGNOSIS  °Your health care provider will do a physical exam and ask about your symptoms. Chest X-rays or other tests may be done to rule out other problems. °TREATMENT  °Costochondritis usually goes away on its own over time. Your health care provider may prescribe medicine to help relieve pain. °HOME CARE INSTRUCTIONS  °· Avoid exhausting physical activity. Try not to strain your ribs during normal activity. This would include any activities using chest, abdominal, and side muscles, especially if heavy weights are used. °· Apply ice to the affected area for the first 2 days after the pain begins. °· Put ice in a plastic bag. °· Place a towel between your skin and the bag. °· Leave the ice on for 20 minutes, 2-3 times a day. °· Only take over-the-counter or prescription medicines as directed by your health care provider. °SEEK MEDICAL CARE IF: °· You have redness or swelling at the rib joints. These are signs of infection. °· Your pain does not go away despite rest or medicine. °SEEK IMMEDIATE MEDICAL CARE IF:  °· Your pain increases or you are very uncomfortable. °· You have shortness of breath or difficulty breathing. °· You cough up blood. °· You have worse chest pains,  sweating, or vomiting. °· You have a fever or persistent symptoms for more than 2-3 days. °· You have a fever and your symptoms suddenly get worse. °MAKE SURE YOU:  °· Understand these instructions. °· Will watch your condition. °· Will get help right away if you are not doing well or get worse. °  °This information is not intended to replace advice given to you by your health care provider. Make sure you discuss any questions you have with your health care provider. °  °Document Released: 01/14/2005 Document Revised: 01/25/2013 Document Reviewed: 11/08/2012 °Elsevier Interactive Patient Education ©2016 [employer] ° °Chest Wall Pain °Chest wall pain is pain in or around the bones and muscles of your chest. Sometimes, an injury causes this pain. Sometimes, the cause may not be known. This pain may take several weeks or longer to get better. °HOME CARE INSTRUCTIONS  °Pay attention to any changes in your symptoms. Take these actions to help with your pain:  °· Rest as told by your health care provider.   °· Avoid activities that cause pain. These include any activities that use your chest muscles or your abdominal and side muscles to lift heavy items.    °· If directed, apply ice to the painful area: °¨ Put ice in a plastic bag. °¨ Place a towel between your skin and the bag. °¨ Leave the ice on for 20 minutes, 2-3 times per day. °· Take over-the-counter and prescription medicines only as told by your health care provider. °· Do not use tobacco products, including cigarettes, chewing tobacco, and e-cigarettes.   If you need help quitting, ask your health care provider. °· Keep all follow-up visits as told by your health care provider. This is important. °SEEK MEDICAL CARE IF: °· You have a fever. °· Your chest pain becomes worse. °· You have new symptoms. °SEEK IMMEDIATE MEDICAL CARE IF: °· You have nausea or vomiting. °· You feel sweaty or light-headed. °· You have a cough with phlegm (sputum) or you cough up  blood. °· You develop shortness of breath. °  °This information is not intended to replace advice given to you by your health care provider. Make sure you discuss any questions you have with your health care provider. °  °Document Released: 04/06/2005 Document Revised: 12/26/2014 Document Reviewed: 07/02/2014 °Elsevier Interactive Patient Education ©2016 [employer] Inc[employer] ° °

## 2015-10-30 NOTE — ED Provider Notes (Signed)
[facility] Emergency Department Provider Note  ____________________________________________  Time seen: 5:00 AM  I have reviewed the triage vital signs and the nursing notes.   HISTORY  Chief Complaint Chest Pain      HPI Ian Munoz is a 35 y.o. male presents with 7 out of 10 central chest discomfort as described as sharp with acute onset at 6 PM. Patient states that the pain occurred while he was at work. Patient states that pain is worse with movement and deep inspiration as well as palpation. Patient denies any personal cardiac history. Patient denies any history of DVT or PE. Patient denies any lower external pain or swelling. Patient denies any history of this discomfort in the past     Past Medical History  Diagnosis Date  . Prostate enlargement   . Depression     There are no active problems to display for this patient.   Past Surgical History  Procedure Laterality Date  . Wrist surgery Right   . Eye surgery      Current Outpatient Rx  Name  Route  Sig  Dispense  Refill  . clindamycin (CLEOCIN) 300 MG capsule   Oral   Take 1 capsule (300 mg total) by mouth 3 (three) times daily.   30 capsule   0   . cyclobenzaprine (FLEXERIL) 10 MG tablet   Oral   Take 1 tablet (10 mg total) by mouth every 8 (eight) hours as needed for muscle spasms.   30 tablet   1   . cyclobenzaprine (FLEXERIL) 10 MG tablet   Oral   Take 1 tablet (10 mg total) by mouth every 8 (eight) hours as needed for muscle spasms.   30 tablet   1   . ibuprofen (ADVIL,MOTRIN) 800 MG tablet   Oral   Take 1 tablet (800 mg total) by mouth every 8 (eight) hours as needed.   30 tablet   0   . ibuprofen (ADVIL,MOTRIN) 800 MG tablet   Oral   Take 1 tablet (800 mg total) by mouth every 8 (eight) hours as needed.   30 tablet   0   . naproxen (NAPROSYN) 500 MG tablet   Oral   Take 1 tablet (500 mg total) by mouth 2 (two) times daily with a meal.   20 tablet    0   . traMADol (ULTRAM) 50 MG tablet   Oral   Take 1 tablet (50 mg total) by mouth every 6 (six) hours as needed.   20 tablet   0   . traMADol (ULTRAM) 50 MG tablet   Oral   Take 1 tablet (50 mg total) by mouth every 6 (six) hours as needed.   12 tablet   0     Allergies No known drug allergies No family history on file.  Social History Social History  Substance Use Topics  . Smoking status: Never Smoker   . Smokeless tobacco: Current User    Types: Chew  . Alcohol Use: No    Review of Systems  Constitutional: Negative for fever. Eyes: Negative for visual changes. ENT: Negative for sore throat. Cardiovascular: Positive for chest pain. Respiratory: Negative for shortness of breath. Gastrointestinal: Negative for abdominal pain, vomiting and diarrhea. Genitourinary: Negative for dysuria. Musculoskeletal: Negative for back pain. Skin: Negative for rash. Neurological: Negative for headaches, focal weakness or numbness.   10-point ROS otherwise negative.  ____________________________________________   PHYSICAL EXAM:  VITAL SIGNS: ED Triage Vitals  Enc Vitals  Group     BP 10/29/15 2352 116/72 mmHg     Pulse Rate 10/29/15 2352 93     Resp 10/29/15 2352 18     Temp 10/29/15 2352 98.3 F (36.8 C)     Temp Source 10/29/15 2352 Oral     SpO2 10/29/15 2352 98 %     Weight 10/29/15 2352 130 lb (58.968 kg)     Height 10/29/15 2352  (1.575 m)     Head Cir --      Peak Flow --      Pain Score 10/29/15 2354 9     Pain Loc --      Pain Edu? --      Excl. in GC? --      Constitutional: Alert and oriented. Well appearing and in no distress. Eyes: Conjunctivae are normal. PERRL. Normal extraocular movements. ENT   Head: Normocephalic and atraumatic.   Nose: No congestion/rhinnorhea.   Mouth/Throat: Mucous membranes are moist.   Neck: No stridor. Hematological/Lymphatic/Immunilogical: No cervical lymphadenopathy. Cardiovascular: Normal rate,  regular rhythm. Normal and symmetric distal pulses are present in all extremities. No murmurs, rubs, or gallops.Pain with palpation of the parasternal borders. Respiratory: Normal respiratory effort without tachypnea nor retractions. Breath sounds are clear and equal bilaterally. No wheezes/rales/rhonchi. Gastrointestinal: Soft and nontender. No distention. There is no CVA tenderness. Genitourinary: deferred Musculoskeletal: Nontender with normal range of motion in all extremities. No joint effusions.  No lower extremity tenderness nor edema. Neurologic:  Normal speech and language. No gross focal neurologic deficits are appreciated. Speech is normal.  Skin:  Skin is warm, dry and intact. No rash noted. Psychiatric: Mood and affect are normal. Speech and behavior are normal. Patient exhibits appropriate insight and judgment.  ____________________________________________    LABS (pertinent positives/negatives)  Labs Reviewed  BASIC METABOLIC PANEL  CBC  TROPONIN I  TROPONIN I  FIBRIN DERIVATIVES D-DIMER ([facility] ONLY)     ____________________________________________   EKG  ED ECG REPORT I, Hazelton N BROWN, the attending physician, personally viewed and interpreted this ECG.   Date: 10/30/2015  EKG Time: 11:53 PM  Rate: 98  Rhythm: Normal sinus rhythm  Axis: Normal  Intervals: Normal  ST&T Change: None  ____________________________________________    RADIOLOGY     DG Chest 2 View (Final result) Result time: 10/30/15 00:33:40   Final result by Rad Results In Interface (10/30/15 00:33:40)   Narrative:   CLINICAL DATA: 35 year old male with chest pain  EXAM: CHEST 2 VIEW  COMPARISON: Chest radiograph dated 05/30/2015  FINDINGS: The heart size and mediastinal contours are within normal limits. Both lungs are clear. The visualized skeletal structures are unremarkable.  IMPRESSION: No active cardiopulmonary disease.   Electronically Signed By: Elgie Collard M.D. On: 10/30/2015 00:33          Procedures     INITIAL IMPRESSION / ASSESSMENT AND PLAN / ED COURSE  Pertinent labs & imaging results that were available during my care of the patient were reviewed by me and considered in my medical decision making (see chart for details).  History of physical exam consistent with possible chest wall discomfort patient given Toradol 30 mg IV and will be prescribed Toradol at home.  ____________________________________________   FINAL CLINICAL IMPRESSION(S) / ED DIAGNOSES  Final diagnoses:  Costochondritis, acute      Darci Current, MD 10/30/15 0600

## 2015-11-13 ENCOUNTER — Emergency Department
Admission: EM | Admit: 2015-11-13 | Discharge: 2015-11-13 | Disposition: A | Discharge status: Home / Self Care | Payer: Self-pay | Attending: Emergency Medicine | Admitting: Emergency Medicine

## 2015-11-13 ENCOUNTER — Emergency Department: Payer: Self-pay

## 2015-11-13 ENCOUNTER — Encounter: Payer: Self-pay | Admitting: Emergency Medicine

## 2015-11-13 DIAGNOSIS — F17220 Nicotine dependence, chewing tobacco, uncomplicated: Secondary | ICD-10-CM | POA: Insufficient documentation

## 2015-11-13 DIAGNOSIS — Z792 Long term (current) use of antibiotics: Secondary | ICD-10-CM | POA: Insufficient documentation

## 2015-11-13 DIAGNOSIS — Z791 Long term (current) use of non-steroidal anti-inflammatories (NSAID): Secondary | ICD-10-CM | POA: Insufficient documentation

## 2015-11-13 DIAGNOSIS — R300 Dysuria: Secondary | ICD-10-CM | POA: Insufficient documentation

## 2015-11-13 DIAGNOSIS — M7918 Myalgia, other site: Secondary | ICD-10-CM

## 2015-11-13 DIAGNOSIS — M791 Myalgia: Secondary | ICD-10-CM | POA: Insufficient documentation

## 2015-11-13 DIAGNOSIS — Z79899 Other long term (current) drug therapy: Secondary | ICD-10-CM | POA: Insufficient documentation

## 2015-11-13 DIAGNOSIS — R109 Unspecified abdominal pain: Secondary | ICD-10-CM | POA: Insufficient documentation

## 2015-11-13 LAB — COMPREHENSIVE METABOLIC PANEL
ALT: 15 U/L — AB (ref 17–63)
ANION GAP: 7 (ref 5–15)
AST: 19 U/L (ref 15–41)
Albumin: 4.2 g/dL (ref 3.5–5.0)
Alkaline Phosphatase: 87 U/L (ref 38–126)
BUN: 8 mg/dL (ref 6–20)
CHLORIDE: 105 mmol/L (ref 101–111)
CO2: 24 mmol/L (ref 22–32)
CREATININE: 0.89 mg/dL (ref 0.61–1.24)
Calcium: 9 mg/dL (ref 8.9–10.3)
Glucose, Bld: 118 mg/dL — ABNORMAL HIGH (ref 65–99)
Potassium: 3.8 mmol/L (ref 3.5–5.1)
SODIUM: 136 mmol/L (ref 135–145)
Total Bilirubin: 0.7 mg/dL (ref 0.3–1.2)
Total Protein: 7.5 g/dL (ref 6.5–8.1)

## 2015-11-13 LAB — URINALYSIS COMPLETE WITH MICROSCOPIC (ARMC ONLY)
Bacteria, UA: NONE SEEN
Bilirubin Urine: NEGATIVE
Glucose, UA: NEGATIVE mg/dL
HGB URINE DIPSTICK: NEGATIVE
Ketones, ur: NEGATIVE mg/dL
LEUKOCYTES UA: NEGATIVE
Nitrite: NEGATIVE
PH: 5 (ref 5.0–8.0)
PROTEIN: NEGATIVE mg/dL
Specific Gravity, Urine: 1.018 (ref 1.005–1.030)
Squamous Epithelial / LPF: NONE SEEN

## 2015-11-13 LAB — CBC
HCT: 42.8 % (ref 40.0–52.0)
HEMOGLOBIN: 14.5 g/dL (ref 13.0–18.0)
MCH: 33.3 pg (ref 26.0–34.0)
MCHC: 34 g/dL (ref 32.0–36.0)
MCV: 98.1 fL (ref 80.0–100.0)
Platelets: 230 10*3/uL (ref 150–440)
RBC: 4.37 MIL/uL — AB (ref 4.40–5.90)
RDW: 13.4 % (ref 11.5–14.5)
WBC: 5.3 10*3/uL (ref 3.8–10.6)

## 2015-11-13 MED ORDER — HYDROCODONE-ACETAMINOPHEN 5-325 MG PO TABS
2.0000 | ORAL_TABLET | Freq: Once | ORAL | Status: AC
  Administered 2015-11-13: 2 via ORAL

## 2015-11-13 MED ORDER — HYDROCODONE-ACETAMINOPHEN 5-325 MG PO TABS
ORAL_TABLET | ORAL | Status: AC
  Filled 2015-11-13: qty 1

## 2015-11-13 MED ORDER — HYDROCODONE-ACETAMINOPHEN 5-325 MG PO TABS
ORAL_TABLET | ORAL | Status: AC
  Administered 2015-11-13: 2 via ORAL
  Filled 2015-11-13: qty 1

## 2015-11-13 MED ORDER — HYDROCODONE-ACETAMINOPHEN 5-325 MG PO TABS: 1.0000 | ORAL_TABLET | ORAL | 0 refills | Status: DC | PRN

## 2015-11-13 NOTE — ED Triage Notes (Addendum)
C/O right flank pain and some RLQ abdominal pain.  Denies loss bowel or bladder. Has had dysuria. Denies penile discharge. Denies hematuria. Pain comes and goes.

## 2015-11-13 NOTE — Discharge Instructions (Signed)
We evaluated you today for pain in your right flank and your vital signs, labs, and CT scan of your abdomen and pelvis were all normal and reassuring.  We believe that your symptoms are caused by musculoskeletal strain.  Please read through the included information about additional care such as heating pads, over-the-counter pain medicine.  If you were provided a prescription please use it only as needed and as instructed.  Remember that early mobility and using the affected part of your body is actually better than keeping it immobile.  Take Norco as prescribed for severe pain. Do not drink alcohol, drive or participate in any other potentially dangerous activities while taking this medication as it may make you sleepy. Do not take this medication with any other sedating medications, either prescription or over-the-counter. If you were prescribed Percocet or Vicodin, do not take these with acetaminophen (Tylenol) as it is already contained within these medications.   This medication is an opiate (or narcotic) pain medication and can be habit forming.  Use it as little as possible to achieve adequate pain control.  Do not use or use it with extreme caution if you have a history of opiate abuse or dependence.  If you are on a pain contract with your primary care doctor or a pain specialist, be sure to let them know you were prescribed this medication today from the [facility] Emergency Department.  This medication is intended for your use only - do not give any to anyone else and keep it in a secure place where nobody else, especially children, have access to it.  It will also cause or worsen constipation, so you may want to consider taking an over-the-counter stool softener while you are taking this medication.   Follow-up with the doctor listed as recommended or return to the emergency department with new or worsening symptoms that concern you.

## 2015-11-13 NOTE — ED Provider Notes (Signed)
[facility] Emergency Department Provider Note  ____________________________________________   First MD Initiated Contact with Patient 11/13/15 1717     (approximate)  I have reviewed the triage vital signs and the nursing notes.   HISTORY  Chief Complaint Flank Pain    HPI Ian Munoz is a 35 y.o. male with a medical history of prostate enlargement and prostatitis with some chronic dysuria as well as depression who presents for evaluation of gradual onset worsening aching and sharp pain in his right flank.  He states that it started about a week ago and has been getting steadily worse.  He now describes it as moderate to severe although he currently appears to be in no acute distress and is talking and using his cell phone without difficulty.  He denies fever/chills, nausea, vomiting, shortness of breath, chest pain.  The pain occasionally radiates around from the right flank to the right side of his abdomen.  He has some mild chronic dysuria for which she is seen his primary care doctor and multiple specialists but they have not been able to identify the cause.  He denies hematuria and penile discharge.  He states that movement and lying on the right side makes the pain worse and rest and leaning forward makes it better.   Past Medical History:  Diagnosis Date  . Depression   . Prostate enlargement     There are no active problems to display for this patient.   Past Surgical History:  Procedure Laterality Date  . EYE SURGERY    . WRIST SURGERY Right     Prior to Admission medications   Medication Sig Start Date End Date Taking? Authorizing Provider  clindamycin (CLEOCIN) 300 MG capsule Take 1 capsule (300 mg total) by mouth 3 (three) times daily. 07/27/15   Rebecka Apley, MD  cyclobenzaprine (FLEXERIL) 10 MG tablet Take 1 tablet (10 mg total) by mouth every 8 (eight) hours as needed for muscle spasms. 02/05/15   Charmayne Sheer Beers, PA-C    cyclobenzaprine (FLEXERIL) 10 MG tablet Take 1 tablet (10 mg total) by mouth every 8 (eight) hours as needed for muscle spasms. 05/30/15 05/29/16  Emily Filbert, MD  HYDROcodone-acetaminophen (NORCO/VICODIN) 5-325 MG tablet Take 1-2 tablets by mouth every 4 (four) hours as needed for moderate pain. 11/13/15   Loleta Rose, MD  ibuprofen (ADVIL,MOTRIN) 800 MG tablet Take 1 tablet (800 mg total) by mouth every 8 (eight) hours as needed. 02/05/15   Charmayne Sheer Beers, PA-C  ibuprofen (ADVIL,MOTRIN) 800 MG tablet Take 1 tablet (800 mg total) by mouth every 8 (eight) hours as needed. 05/30/15   Emily Filbert, MD  ketorolac (TORADOL) 10 MG tablet Take 1 tablet (10 mg total) by mouth every 8 (eight) hours as needed for moderate pain. 10/30/15   Darci Current, MD  naproxen (NAPROSYN) 500 MG tablet Take 1 tablet (500 mg total) by mouth 2 (two) times daily with a meal. 07/06/15   Joni Reining, PA-C    Allergies Review of patient's allergies indicates no known allergies.  History reviewed. No pertinent family history.  Social History Social History  Substance Use Topics  . Smoking status: Never Smoker  . Smokeless tobacco: Current User    Types: Chew  . Alcohol use No    Review of Systems Constitutional: No fever/chills Eyes: No visual changes. ENT: No sore throat. Cardiovascular: Denies chest pain. Respiratory: Denies shortness of breath. Gastrointestinal: Right flank pain radiating around at  times to his right.  No nausea, no vomiting.  No diarrhea.  No constipation. Genitourinary: Chronic dysuria Musculoskeletal: Right flank pain Skin: Negative for rash. Neurological: Negative for headaches, focal weakness or numbness.  10-point ROS otherwise negative.  ____________________________________________   PHYSICAL EXAM:  VITAL SIGNS: ED Triage Vitals  Enc Vitals Group     BP 11/13/15 1453 119/72     Pulse Rate 11/13/15 1453 85     Resp 11/13/15 1453 17     Temp 11/13/15 1453  98.4 F (36.9 C)     Temp Source 11/13/15 1453 Oral     SpO2 11/13/15 1453 98 %     Weight 11/13/15 1453 180 lb (81.6 kg)     Height 11/13/15 1453 5\' 2"  (1.575 m)     Head Circumference --      Peak Flow --      Pain Score 11/13/15 1454 9     Pain Loc --      Pain Edu? --      Excl. in GC? --     Constitutional: Alert and oriented. Well appearing and in no acute distress. Eyes: Conjunctivae are normal. PERRL. EOMI. Head: Atraumatic. Nose: No congestion/rhinnorhea. Mouth/Throat: Mucous membranes are moist.  Oropharynx non-erythematous. Neck: No stridor.  No meningeal signs.   Cardiovascular: Normal rate, regular rhythm. Good peripheral circulation. Grossly normal heart sounds.   Respiratory: Normal respiratory effort.  No retractions. Lungs CTAB. Gastrointestinal: Soft and nontender. No distention. Right CVA tenderness, moderate to severe in intensity Musculoskeletal: No lower extremity tenderness nor edema. No gross deformities of extremities. Neurologic:  Normal speech and language. No gross focal neurologic deficits are appreciated.  Skin:  Skin is warm, dry and intact. No rash noted. Psychiatric: Mood and affect are normal. Speech and behavior are normal.  ____________________________________________   LABS (all labs ordered are listed, but only abnormal results are displayed)  Labs Reviewed  COMPREHENSIVE METABOLIC PANEL - Abnormal; Notable for the following:       Result Value   Glucose, Bld 118 (*)    ALT 15 (*)    All other components within normal limits  CBC - Abnormal; Notable for the following:    RBC 4.37 (*)    All other components within normal limits  URINALYSIS COMPLETEWITH MICROSCOPIC ([facility] ONLY) - Abnormal; Notable for the following:    Color, Urine YELLOW (*)    APPearance CLEAR (*)    All other components within normal limits    ____________________________________________  EKG  None ____________________________________________  RADIOLOGY   Ct Renal Stone Study  Result Date: 11/13/2015 CLINICAL DATA:  Right flank pain. EXAM: CT ABDOMEN AND PELVIS WITHOUT CONTRAST TECHNIQUE: Multidetector CT imaging of the abdomen and pelvis was performed following the standard protocol without IV contrast. COMPARISON:  CT abdomen and pelvis 02/23/2011 FINDINGS: Lower chest: Lungs are clear without focal nodule, mass, or airspace disease. Heart size is normal. No significant pleural or pericardial effusion is present. Hepatobiliary: No focal hepatic lesions are present. The liver surface is smooth. The common bile duct and gallbladder are within normal limits. Pancreas: The pancreatic mass or cyst is present. No ductal dilation is present. Spleen: Unremarkable Adrenals/Urinary Tract: The adrenal glands are normal bilaterally. The kidneys and ureters are within normal limits bilaterally. No mass lesion or stone is present. There is no hydronephrosis. The urinary bladder is within normal limits. Stomach/Bowel: The stomach and duodenum are within normal limits. The small bowel is unremarkable. The appendix is visualized and normal.  The ascending and transverse colon are within normal limits. The descending rectosigmoid colon are normal as well. Vascular/Lymphatic: No significant vascular lesions or adenopathy is present. Reproductive: Negative Other: No significant free fluid is present. Musculoskeletal: No focal lytic or blastic lesions are present. The anatomic pelvis is intact. IMPRESSION: Negative CT of the abdomen and pelvis. No nephrolithiasis or hydronephrosis. Electronically Signed   By: Marin Roberts M.D.   On: 11/13/2015 18:00   ____________________________________________   PROCEDURES  Procedure(s) performed:   Procedures   ____________________________________________   INITIAL IMPRESSION / ASSESSMENT AND PLAN /  ED COURSE  Pertinent labs & imaging results that were available during my care of the patient were reviewed by me and considered in my medical decision making (see chart for details).  The patient is well-appearing and in no acute distress with normal vital signs and normal blood work.  He also has a normal urinalysis with no evidence of hematuria, but his signs and symptoms and history of present illness are most consistent with a kidney stone.  Given his issues with prostatitis and chronic dysuria in the past I feel it is appropriate to obtain a CT renal stone protocol of his abdomen and pelvis for further evaluation.  He agrees with this plan.  Clinical Course  Comment By Time  The CT scan is unremarkable.  His workup is generally unremarkable.  Under the circumstances the pain is most likely musculoskeletal.  Given his age and lack of comorbidities it is very unlikely he is suffering from a rare diagnosis such as renal ischemia.  He is asking for pain medicine though again he appears to be in minimal distress.  I will give him a dose of pain medicine here and a very few pills of Norco as a prescription and encouraged him to follow up with his outpatient provider.I reviewed the patient's prescription history over the last 12 months in the [place] Controlled Substances Database, and he has had only one prescription filled for tramadol in the last 12 months. Loleta Rose, MD 07/26 1809    ____________________________________________  FINAL CLINICAL IMPRESSION(S) / ED DIAGNOSES  Final diagnoses:  Right flank pain  Musculoskeletal pain     MEDICATIONS GIVEN DURING THIS VISIT:  Medications  HYDROcodone-acetaminophen (NORCO/VICODIN) 5-325 MG per tablet 2 tablet (2 tablets Oral Given 11/13/15 1817)     NEW OUTPATIENT MEDICATIONS STARTED DURING THIS VISIT:  Discharge Medication List as of 11/13/2015  7:00 PM    START taking these medications   Details  HYDROcodone-acetaminophen (NORCO/VICODIN)  5-325 MG tablet Take 1-2 tablets by mouth every 4 (four) hours as needed for moderate pain., Starting Wed 11/13/2015, Print          Note:  This document was prepared using Dragon voice recognition software and may include unintentional dictation errors.    Loleta Rose, MD 11/13/15 [id number]

## 2016-01-20 ENCOUNTER — Encounter (HOSPITAL_COMMUNITY): Payer: Self-pay

## 2016-01-20 ENCOUNTER — Emergency Department (HOSPITAL_COMMUNITY): Payer: Self-pay

## 2016-01-20 ENCOUNTER — Emergency Department (HOSPITAL_COMMUNITY)
Admission: EM | Admit: 2016-01-20 | Discharge: 2016-01-20 | Disposition: A | Discharge status: Home / Self Care | Payer: Self-pay | Attending: Emergency Medicine | Admitting: Emergency Medicine

## 2016-01-20 DIAGNOSIS — X509XXA Other and unspecified overexertion or strenuous movements or postures, initial encounter: Secondary | ICD-10-CM | POA: Insufficient documentation

## 2016-01-20 DIAGNOSIS — Y929 Unspecified place or not applicable: Secondary | ICD-10-CM | POA: Insufficient documentation

## 2016-01-20 DIAGNOSIS — Y999 Unspecified external cause status: Secondary | ICD-10-CM | POA: Insufficient documentation

## 2016-01-20 DIAGNOSIS — Y939 Activity, unspecified: Secondary | ICD-10-CM | POA: Insufficient documentation

## 2016-01-20 DIAGNOSIS — M25511 Pain in right shoulder: Secondary | ICD-10-CM | POA: Insufficient documentation

## 2016-01-20 MED ORDER — NAPROXEN 500 MG PO TABS: 500.0000 mg | ORAL_TABLET | Freq: Two times a day (BID) | ORAL | 0 refills | Status: DC

## 2016-01-20 NOTE — ED Notes (Signed)
Declined W/C at D/C and was escorted to lobby by RN. 

## 2016-01-20 NOTE — ED Triage Notes (Signed)
Per Pt, Pt is coming from home with complaints of right shoulder pain after he heard it pop two night ago with movement.

## 2016-01-20 NOTE — ED Provider Notes (Signed)
[facility]-EMERGENCY DEPT Provider Note   CSN: [id number] Arrival date & time: 01/20/16  1149  By signing my name below, I, Ian Munoz, attest that this documentation has been prepared under the direction and in the presence of non-physician practitioner, Arthor CaptainAbigail Tyshia Fenter, PA-C. Electronically Signed: Majel HomerPeyton Munoz, Scribe. 01/20/2016. 2:13 PM.  History   Chief Complaint Chief Complaint  Patient presents with  . Shoulder Pain   The history is provided by the patient. No language interpreter was used.   HPI Comments: Ian Munoz is a 35 y.o. male who presents to the Emergency Department complaining of gradually worsening, right shoulder pain that began 2 days ago and worsened today. Pt reports he was stretching with his hands over his head 2 days ago when he suddenly heard a "pop" and has experienced pain ever since. He states associated right arm pain. He notes he took 1600 mg ibuprofen last night with no relief. He states hx of pain in his right arm last year and notes he visited a chiropractor with complete relief. He also reports hx of right shoulder dislocation. Pt states he currently works at [employer] and uses his arms often.   Past Medical History:  Diagnosis Date  . Depression   . Prostate enlargement    There are no active problems to display for this patient.  Past Surgical History:  Procedure Laterality Date  . EYE SURGERY    . WRIST SURGERY Right     Home Medications    Prior to Admission medications   Medication Sig Start Date End Date Taking? Authorizing Provider  clindamycin (CLEOCIN) 300 MG capsule Take 1 capsule (300 mg total) by mouth 3 (three) times daily. 07/27/15   Rebecka ApleyAllison P Webster, MD  cyclobenzaprine (FLEXERIL) 10 MG tablet Take 1 tablet (10 mg total) by mouth every 8 (eight) hours as needed for muscle spasms. 02/05/15   Charmayne Sheerharles M Beers, PA-C  cyclobenzaprine (FLEXERIL) 10 MG tablet Take 1 tablet (10 mg total) by mouth every 8 (eight) hours as needed for muscle  spasms. 05/30/15 05/29/16  Emily FilbertJonathan E Williams, MD  HYDROcodone-acetaminophen (NORCO/VICODIN) 5-325 MG tablet Take 1-2 tablets by mouth every 4 (four) hours as needed for moderate pain. 11/13/15   Loleta Roseory Forbach, MD  ibuprofen (ADVIL,MOTRIN) 800 MG tablet Take 1 tablet (800 mg total) by mouth every 8 (eight) hours as needed. 02/05/15   Charmayne Sheerharles M Beers, PA-C  ibuprofen (ADVIL,MOTRIN) 800 MG tablet Take 1 tablet (800 mg total) by mouth every 8 (eight) hours as needed. 05/30/15   Emily FilbertJonathan E Williams, MD  ketorolac (TORADOL) 10 MG tablet Take 1 tablet (10 mg total) by mouth every 8 (eight) hours as needed for moderate pain. 10/30/15   Darci Currentandolph N Brown, MD  naproxen (NAPROSYN) 500 MG tablet Take 1 tablet (500 mg total) by mouth 2 (two) times daily with a meal. 07/06/15   [name], PA-C    Family History No family history on file.  Social History Social History  Substance Use Topics  . Smoking status: Never Smoker  . Smokeless tobacco: Current User    Types: Chew  . Alcohol use No     Allergies   Review of patient's allergies indicates no known allergies.   Review of Systems Review of Systems  Musculoskeletal: Positive for arthralgias.  Neurological: Negative for numbness.   Physical Exam Updated Vital Signs BP 129/67 (BP Location: Left Arm)   Pulse 79   Temp 98.4 F (36.9 C) (Oral)   Resp 18  Ht 5\' 2"  (1.575 m)   Wt 158 lb 6 oz (71.8 kg)   SpO2 99%   BMI 28.97 kg/m   Physical Exam  Constitutional: He is oriented to person, place, and time. He appears well-developed and well-nourished.  HENT:  Head: Normocephalic.  Eyes: EOM are normal.  Neck: Normal range of motion.  Pulmonary/Chest: Effort normal.  Abdominal: He exhibits no distension.  Musculoskeletal: Normal range of motion. He exhibits tenderness. He exhibits no deformity.  No bruising. Tenderness in right pectoralis muscle. Positive cross arm test.  Full ROM of right shoulder.   Neurological: He is alert and oriented  to person, place, and time.  Psychiatric: He has a normal mood and affect.  Nursing note and vitals reviewed.  ED Treatments / Results  Labs (all labs ordered are listed, but only abnormal results are displayed) Labs Reviewed - No data to display  EKG  EKG Interpretation None       Radiology Dg Shoulder Right  Result Date: 01/20/2016 CLINICAL DATA:  Right shoulder pain.  Worse with movement. EXAM: RIGHT SHOULDER - 2+ VIEW COMPARISON:  None. FINDINGS: There is no evidence of fracture or dislocation. There is no evidence of arthropathy or other focal bone abnormality. Soft tissues are unremarkable. IMPRESSION: No acute osseous injury of the right shoulder. Electronically Signed   By: Elige Ko   On: 01/20/2016 13:23    Procedures Procedures (including critical care time)  Medications Ordered in ED Medications - No data to display  DIAGNOSTIC STUDIES:  Oxygen Saturation is 99% on RA, normal by my interpretation.    COORDINATION OF CARE:  2:05 PM Discussed treatment plan with pt at bedside and pt agreed to plan.  Initial Impression / Assessment and Plan / ED Course  I have reviewed the triage vital signs and the nursing notes.  Pertinent labs & imaging results that were available during my care of the patient were reviewed by me and considered in my medical decision making (see chart for details).  Clinical Course    Patient X-Ray negative for obvious fracture or dislocation. Pain managed in ED. Pt advised to follow up with orthopedics if symptoms persist for possibility of missed fracture diagnosis. Patient given brace while in ED, conservative therapy recommended and discussed. Patient will be dc home & is agreeable with above plan.   I personally performed the services described in this documentation, which was scribed in my presence. The recorded information has been reviewed and is accurate.   Final Clinical Impressions(s) / ED Diagnoses   Final diagnoses:    None    New Prescriptions New Prescriptions   No medications on file     Arthor Captain, PA-C 01/22/16 1636    Donnetta Hutching, MD 01/22/16 2005

## 2016-01-20 NOTE — Discharge Instructions (Signed)
Purchase lidocaine patches (4% ) at the drug store. SEEK IMMEDIATE MEDICAL CARE IF:  Your arm, hand, or fingers are numb or tingling. Your arm, hand, or fingers are significantly swollen or turn white or blue.

## 2016-10-05 ENCOUNTER — Encounter (INDEPENDENT_AMBULATORY_CARE_PROVIDER_SITE_OTHER): Payer: Self-pay

## 2016-10-05 ENCOUNTER — Ambulatory Visit: Payer: Self-pay | Admitting: Pharmacy Technician

## 2016-10-05 DIAGNOSIS — Z79899 Other long term (current) drug therapy: Secondary | ICD-10-CM

## 2016-10-05 NOTE — Progress Notes (Signed)
Completed [facility] application and contract.  Patient agreed to all terms of the [facility] contract.    Patient approved to receive medication assistance through 2018 at [facility], as long as eligibility criteria continues to be met.   Provided patient with Civil engineer, contracting based on his particular needs.    Referred patient to [facility].  Referred patient to [facility] dental clinic.    [place] [facility]

## 2017-07-22 IMAGING — CR DG WRIST COMPLETE 3+V*R*
4 series · 4 of 4 positions shown · non-contrast
Comparison: None.

CLINICAL DATA: Acute onset of right wrist pain.  Initial encounter.

EXAM:
RIGHT WRIST - COMPLETE 3+ VIEW

[wrist pa]
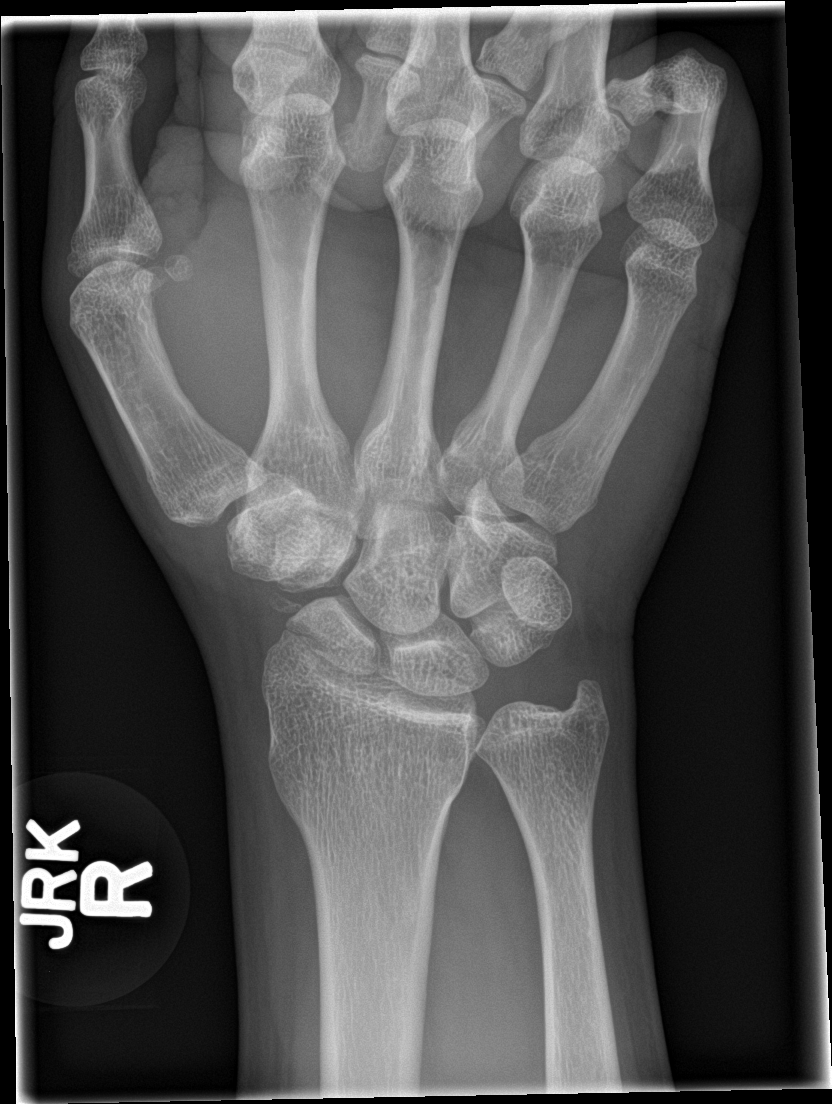

[wrist obl]
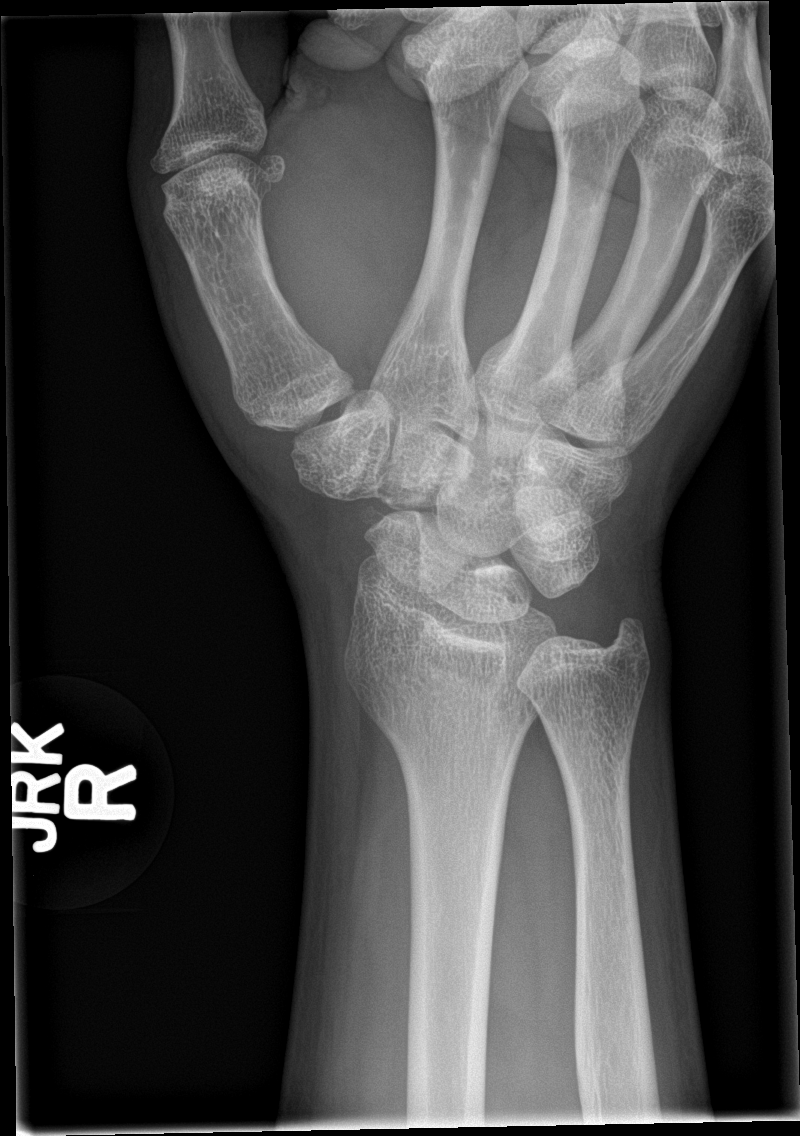

[wrist lat]
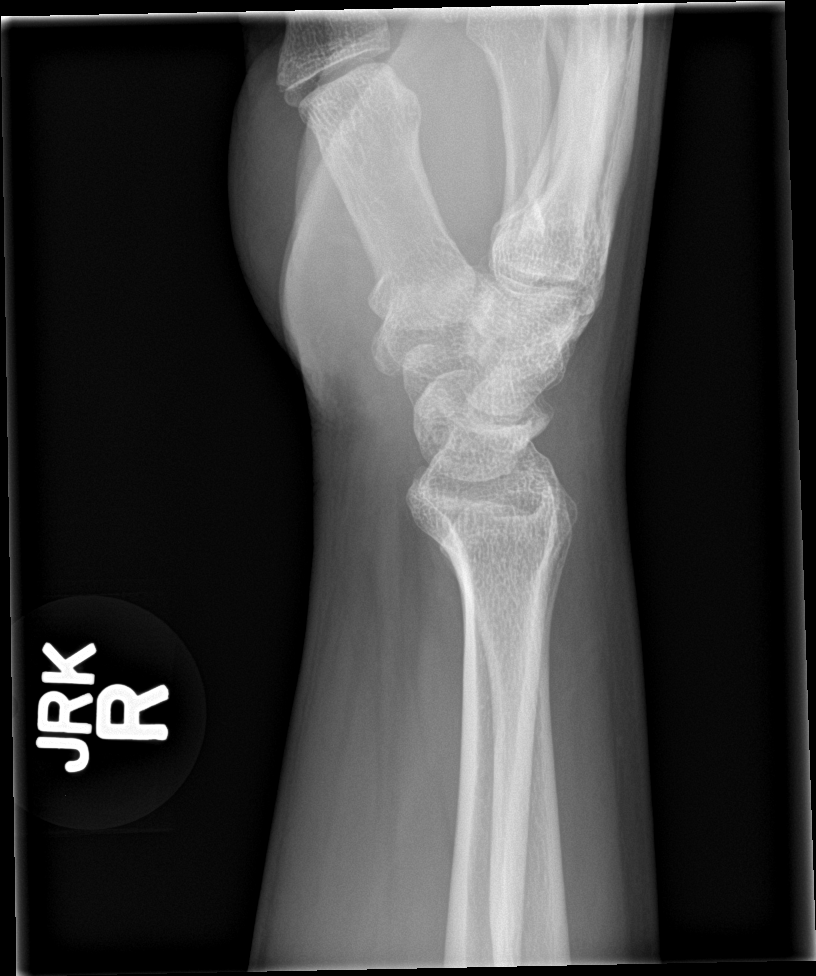

[navicular]
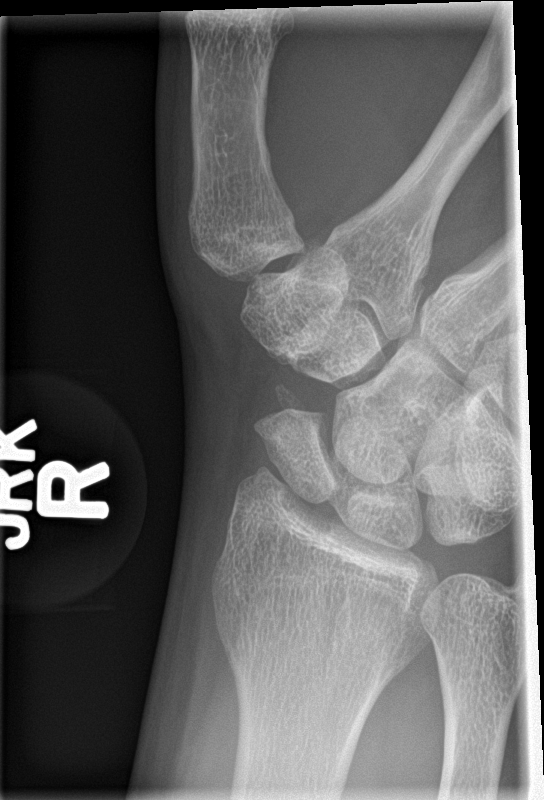

[4 of 4 positions shown; findings below may reference images not displayed]

FINDINGS: There is no evidence of fracture or dislocation. The carpal rows are
intact, and demonstrate normal alignment. The joint spaces are
preserved. There has been resorption of the distal scaphoid.

No significant soft tissue abnormalities are seen.
IMPRESSION: No evidence of acute fracture or dislocation.

## 2017-07-29 ENCOUNTER — Telehealth: Payer: Self-pay | Admitting: Pharmacy Technician

## 2017-07-29 NOTE — Telephone Encounter (Signed)
Patient failed to provide 2019 poi.  No additional medication assistance will be provided by [facility] without the required proof of income documentation.  Patient notified by letter.  Betty J. Kluttz Care Manager [facility] 

## 2017-08-03 ENCOUNTER — Other Ambulatory Visit: Payer: Self-pay | Admitting: Physician Assistant

## 2017-08-03 ENCOUNTER — Ambulatory Visit
Admission: RE | Admit: 2017-08-03 | Discharge: 2017-08-03 | Disposition: A | Discharge status: Home / Self Care | Payer: BLUE CROSS/BLUE SHIELD | Source: Ambulatory Visit | Attending: Physician Assistant | Admitting: Physician Assistant

## 2017-08-03 ENCOUNTER — Other Ambulatory Visit (HOSPITAL_COMMUNITY): Payer: Self-pay | Admitting: Physician Assistant

## 2017-08-03 DIAGNOSIS — S0990XA Unspecified injury of head, initial encounter: Secondary | ICD-10-CM | POA: Diagnosis present

## 2017-08-03 DIAGNOSIS — X58XXXA Exposure to other specified factors, initial encounter: Secondary | ICD-10-CM | POA: Insufficient documentation

## 2017-11-04 ENCOUNTER — Emergency Department: Payer: BLUE CROSS/BLUE SHIELD

## 2017-11-04 ENCOUNTER — Other Ambulatory Visit: Payer: Self-pay

## 2017-11-04 ENCOUNTER — Emergency Department
Admission: EM | Admit: 2017-11-04 | Discharge: 2017-11-04 | Disposition: A | Discharge status: Home / Self Care | Payer: BLUE CROSS/BLUE SHIELD | Attending: Emergency Medicine | Admitting: Emergency Medicine

## 2017-11-04 DIAGNOSIS — K859 Acute pancreatitis without necrosis or infection, unspecified: Secondary | ICD-10-CM | POA: Diagnosis not present

## 2017-11-04 DIAGNOSIS — R1032 Left lower quadrant pain: Secondary | ICD-10-CM | POA: Insufficient documentation

## 2017-11-04 LAB — URINALYSIS, COMPLETE (UACMP) WITH MICROSCOPIC
Bacteria, UA: NONE SEEN
Bilirubin Urine: NEGATIVE
Glucose, UA: NEGATIVE mg/dL
Ketones, ur: NEGATIVE mg/dL
Leukocytes, UA: NEGATIVE
Nitrite: NEGATIVE
Protein, ur: NEGATIVE mg/dL
SPECIFIC GRAVITY, URINE: 1.001 — AB (ref 1.005–1.030)
Squamous Epithelial / LPF: NONE SEEN (ref 0–5)
pH: 6 (ref 5.0–8.0)

## 2017-11-04 LAB — CBC
HCT: 42.5 % (ref 40.0–52.0)
HEMOGLOBIN: 14.8 g/dL (ref 13.0–18.0)
MCH: 34.6 pg — AB (ref 26.0–34.0)
MCHC: 34.9 g/dL (ref 32.0–36.0)
MCV: 99.1 fL (ref 80.0–100.0)
PLATELETS: 259 10*3/uL (ref 150–440)
RBC: 4.28 MIL/uL — ABNORMAL LOW (ref 4.40–5.90)
RDW: 13.4 % (ref 11.5–14.5)
WBC: 8.7 10*3/uL (ref 3.8–10.6)

## 2017-11-04 LAB — COMPREHENSIVE METABOLIC PANEL
ALK PHOS: 112 U/L (ref 38–126)
ALT: 48 U/L — AB (ref 0–44)
AST: 25 U/L (ref 15–41)
Albumin: 4.3 g/dL (ref 3.5–5.0)
Anion gap: 10 (ref 5–15)
BUN: 11 mg/dL (ref 6–20)
CALCIUM: 9.9 mg/dL (ref 8.9–10.3)
CO2: 26 mmol/L (ref 22–32)
CREATININE: 1.17 mg/dL (ref 0.61–1.24)
Chloride: 102 mmol/L (ref 98–111)
GFR calc Af Amer: >60 mL/min (ref >60)
GFR calc non Af Amer: >60 mL/min (ref >60)
GLUCOSE: 97 mg/dL (ref 70–99)
Potassium: 3.7 mmol/L (ref 3.5–5.1)
Sodium: 138 mmol/L (ref 135–145)
Total Bilirubin: 0.5 mg/dL (ref 0.3–1.2)
Total Protein: 7.7 g/dL (ref 6.5–8.1)

## 2017-11-04 LAB — LIPASE, BLOOD: Lipase: 70 U/L — ABNORMAL HIGH (ref 11–51)

## 2017-11-04 MED ORDER — IOHEXOL 300 MG/ML  SOLN
100.0000 mL | Freq: Once | INTRAMUSCULAR | Status: AC | PRN
  Administered 2017-11-04: 100 mL via INTRAVENOUS
  Filled 2017-11-04: qty 100

## 2017-11-04 MED ORDER — MORPHINE SULFATE (PF) 4 MG/ML IV SOLN
4.0000 mg | Freq: Once | INTRAVENOUS | Status: AC
  Administered 2017-11-04: 4 mg via INTRAVENOUS
  Filled 2017-11-04: qty 1

## 2017-11-04 MED ORDER — ONDANSETRON 4 MG PO TBDP: 4.0000 mg | ORAL_TABLET | Freq: Three times a day (TID) | ORAL | 0 refills | Status: DC | PRN

## 2017-11-04 MED ORDER — OXYCODONE-ACETAMINOPHEN 5-325 MG PO TABS
1.0000 | ORAL_TABLET | Freq: Once | ORAL | Status: AC
  Administered 2017-11-04: 1 via ORAL

## 2017-11-04 MED ORDER — OXYCODONE-ACETAMINOPHEN 5-325 MG PO TABS
ORAL_TABLET | ORAL | Status: AC
  Administered 2017-11-04: 1 via ORAL
  Filled 2017-11-04: qty 1

## 2017-11-04 MED ORDER — OXYCODONE-ACETAMINOPHEN 5-325 MG PO TABS: 1.0000 | ORAL_TABLET | ORAL | 0 refills | Status: DC | PRN

## 2017-11-04 MED ORDER — ONDANSETRON HCL 4 MG/2ML IJ SOLN
4.0000 mg | Freq: Once | INTRAMUSCULAR | Status: AC
  Administered 2017-11-04: 4 mg via INTRAVENOUS
  Filled 2017-11-04: qty 2

## 2017-11-04 NOTE — ED Triage Notes (Signed)
Pt come via POV with c/o LLQ pain that started this am. Pt states he thought it was gas, but had BM and it has not let up.  Pt states nausea and denies vomiting. Pt states pain 10-10 pain

## 2017-11-04 NOTE — ED Provider Notes (Signed)
[facility] Emergency Department Provider Note  ____________________________________________  Time seen: Approximately 5:31 PM  I have reviewed the triage vital signs and the nursing notes.   HISTORY  Chief Complaint Abdominal Pain   HPI Ian Munoz is a 37 y.o. male no significant past medical history who presents for evaluation of abdominal pain.  Patient reports sudden onset of severe left lower quadrant abdominal pain this morning.  Initially he thought it was gas but after having a bowel movements the pain has been persistent.  He describes the pain as 10 out of 10, located in the left lower quadrant, dull with intermittent sharp component, constant and nonradiating.  He has had mild nausea but no vomiting, no fever chills, no diarrhea or constipation, no chest pain or shortness of breath.  No prior abdominal surgeries.  Patient denies ever having similar pain.  Last bowel movement was today at 2 PM.  He denies any history of kidney stones.  Denies any testicular or scrotal swelling or pain.  He does endorse dysuria which he has had for several years.  He reports that the dysuria initially started after having prostatitis and he was told by his urologist that his chronic dysuria was due to that even though he was fully treated for it.  Patient reports one vodka shot yesterday and one today.  He denies binge drinking, history of alcoholism, or daily drinking.  Past Medical History:  Diagnosis Date  . Depression   . Prostate enlargement     Past Surgical History:  Procedure Laterality Date  . EYE SURGERY    . WRIST SURGERY Right     Prior to Admission medications   Medication Sig Start Date End Date Taking? Authorizing Provider  clindamycin (CLEOCIN) 300 MG capsule Take 1 capsule (300 mg total) by mouth 3 (three) times daily. 07/27/15   Loney Hering, MD  cyclobenzaprine (FLEXERIL) 10 MG tablet Take 1 tablet (10 mg total) by mouth every 8 (eight)  hours as needed for muscle spasms. 02/05/15   Beers, Pierce Crane, PA-C  HYDROcodone-acetaminophen (NORCO/VICODIN) 5-325 MG tablet Take 1-2 tablets by mouth every 4 (four) hours as needed for moderate pain. 11/13/15   Hinda Kehr, MD  ibuprofen (ADVIL,MOTRIN) 800 MG tablet Take 1 tablet (800 mg total) by mouth every 8 (eight) hours as needed. 02/05/15   Beers, Pierce Crane, PA-C  ibuprofen (ADVIL,MOTRIN) 800 MG tablet Take 1 tablet (800 mg total) by mouth every 8 (eight) hours as needed. 05/30/15   Earleen Newport, MD  ketorolac (TORADOL) 10 MG tablet Take 1 tablet (10 mg total) by mouth every 8 (eight) hours as needed for moderate pain. 10/30/15   Gregor Hams, MD  naproxen (NAPROSYN) 500 MG tablet Take 1 tablet (500 mg total) by mouth 2 (two) times daily with a meal. 01/20/16   Harris, Abigail, PA-C  ondansetron (ZOFRAN ODT) 4 MG disintegrating tablet Take 1 tablet (4 mg total) by mouth every 8 (eight) hours as needed for nausea or vomiting. 11/04/17   Alfred Levins, [place], MD  oxyCODONE-acetaminophen (PERCOCET) 5-325 MG tablet Take 1 tablet by mouth every 4 (four) hours as needed for severe pain. 11/04/17   Rudene Re, MD    Allergies Patient has no known allergies.  No family history on file.  Social History Social History   Tobacco Use  . Smoking status: Never Smoker  . Smokeless tobacco: Current User    Types: Chew  Substance Use Topics  . Alcohol use:  No  . Drug use: No    Review of Systems  Constitutional: Negative for fever. Eyes: Negative for visual changes. ENT: Negative for sore throat. Neck: No neck pain  Cardiovascular: Negative for chest pain. Respiratory: Negative for shortness of breath. Gastrointestinal: + LLQ abdominal pain and nausea. no vomiting or diarrhea. Genitourinary: + chronic dysuria. Musculoskeletal: Negative for back pain. Skin: Negative for rash. Neurological: Negative for headaches, weakness or numbness. Psych: No SI or  HI  ____________________________________________   PHYSICAL EXAM:  VITAL SIGNS: ED Triage Vitals  Enc Vitals Group     BP 11/04/17 1624 (!) 142/92     Pulse Rate 11/04/17 1624 (!) 118     Resp 11/04/17 1624 18     Temp 11/04/17 1624 98.5 F (36.9 C)     Temp Source 11/04/17 1624 Oral     SpO2 11/04/17 1624 98 %     Weight 11/04/17 1623 155 lb (70.3 kg)     Height 11/04/17 1623 5' 2"  (1.575 m)     Head Circumference --      Peak Flow --      Pain Score 11/04/17 1622 10     Pain Loc --      Pain Edu? --      Excl. in [place]? --     Constitutional: Alert and oriented, patient is holding the left side of his abdomen and looks uncomfortable but in no distress.  HEENT:      Head: Normocephalic and atraumatic.         Eyes: Conjunctivae are normal. Sclera is non-icteric.       Mouth/Throat: Mucous membranes are moist.       Neck: Supple with no signs of meningismus. Cardiovascular: Tachycardic with regular rhythm. No murmurs, gallops, or rubs. 2+ symmetrical distal pulses are present in all extremities. No JVD. Respiratory: Normal respiratory effort. Lungs are clear to auscultation bilaterally. No wheezes, crackles, or rhonchi.  Gastrointestinal: Soft, tender to palpation the epigastric region and left lower quadrant, and non distended with positive bowel sounds. No rebound or guarding. Genitourinary: No CVA tenderness. Musculoskeletal: Nontender with normal range of motion in all extremities. No edema, cyanosis, or erythema of extremities. Neurologic: Normal speech and language. Face is symmetric. Moving all extremities. No gross focal neurologic deficits are appreciated. Skin: Skin is warm, dry and intact. No rash noted. Psychiatric: Mood and affect are normal. Speech and behavior are normal.  ____________________________________________   LABS (all labs ordered are listed, but only abnormal results are displayed)  Labs Reviewed  LIPASE, BLOOD - Abnormal; Notable for the  following components:      Result Value   Lipase 70 (*)    All other components within normal limits  COMPREHENSIVE METABOLIC PANEL - Abnormal; Notable for the following components:   ALT 48 (*)    All other components within normal limits  CBC - Abnormal; Notable for the following components:   RBC 4.28 (*)    MCH 34.6 (*)    All other components within normal limits  URINALYSIS, COMPLETE (UACMP) WITH MICROSCOPIC - Abnormal; Notable for the following components:   Color, Urine STRAW (*)    APPearance CLEAR (*)    Specific Gravity, Urine 1.001 (*)    Hgb urine dipstick SMALL (*)    All other components within normal limits   ____________________________________________  EKG  none  ____________________________________________  RADIOLOGY  I have personally reviewed the images performed during this visit and I agree with the Radiologist's  read.   Interpretation by Radiologist:  Ct Abdomen Pelvis W Contrast  Result Date: 11/04/2017 CLINICAL DATA:  Left lower quadrant pain EXAM: CT ABDOMEN AND PELVIS WITH CONTRAST TECHNIQUE: Multidetector CT imaging of the abdomen and pelvis was performed using the standard protocol following bolus administration of intravenous contrast. CONTRAST:  166m OMNIPAQUE IOHEXOL 300 MG/ML  SOLN COMPARISON:  CT 11/13/2015 FINDINGS: Lower chest: Lung bases are clear. No effusions. Heart is normal size. Hepatobiliary: No focal hepatic abnormality. Gallbladder unremarkable. Pancreas: No focal abnormality or ductal dilatation. Spleen: No focal abnormality.  Normal size. Adrenals/Urinary Tract: Slight stranding around the kidneys bilaterally, new since prior study. No hydronephrosis. No renal or ureteral stones. Adrenal glands and urinary bladder unremarkable. Stomach/Bowel: Normal appendix. Stomach, large and small bowel grossly unremarkable. Vascular/Lymphatic: No evidence of aneurysm or adenopathy. Reproductive: No visible focal abnormality. Other: No free fluid or  free air. Musculoskeletal: No acute bony abnormality. IMPRESSION: Slight perinephric stranding around the kidneys bilaterally, left slightly greater than right. This is nonspecific but appears new since 2017. No renal or ureteral stones. No hydronephrosis. No abnormal enhancement pattern to suggest pyelonephritis, but recommend clinical correlation to completely exclude. Normal appendix. Electronically Signed   By: KRolm BaptiseM.D.   On: 11/04/2017 17:49   UKoreaAbdomen Limited Ruq  Result Date: 11/04/2017 CLINICAL DATA:  Pancreatitis and abdominal pain EXAM: ULTRASOUND ABDOMEN LIMITED RIGHT UPPER QUADRANT COMPARISON:  CT abdomen pelvis 11/04/2017 FINDINGS: Gallbladder: No gallstones or wall thickening visualized. No sonographic Murphy sign noted by sonographer. Common bile duct: Diameter: 3 mm Liver: No focal lesion identified. Within normal limits in parenchymal echogenicity. Portal vein is patent on color Doppler imaging with normal direction of blood flow towards the liver. IMPRESSION: No evidence of acute cholecystitis. Electronically Signed   By: KUlyses JarredM.D.   On: 11/04/2017 19:30      ____________________________________________   PROCEDURES  Procedure(s) performed: None Procedures Critical Care performed:  None ____________________________________________   INITIAL IMPRESSION / ASSESSMENT AND PLAN / ED COURSE  37y.o. male no significant past medical history who presents for evaluation of LLQ abdominal pain and nausea since this am.  Patient looks uncomfortable but in no apparent distress, slightly tachycardic with remainder of his vital signs within normal limits, his abdomen is soft with positive bowel sounds, he is tender to palpation on the epigastric and left lower quadrant regions. Lipase is slightly elevated at 70, normal LFTs, lipase, and alk phosp. Possibly from drinking vs gallstone pancreatitis versus idiopathic. Also in the ddx is diverticulitis, kidney stone,  pyelonephritis.  UA showing small amount of hemoglobin with no signs of infection. CT pending. Will give morphine and zofran for symptoms.    Clinical Course as of Nov 05 1935  Thu Nov 04, 2017  1852 CT scan shows perinephric stranding on both kidneys with no evidence of kidney stones and a completely clean UA with no evidence of urinary tract infection.  Discussed with Dr.Herrick, urologist on call who evaluated patient's CAT scan and agrees that this finding is of unclear etiology and most likely not the cause of patient's symptoms at this time.  Right upper quadrant ultrasound has been ordered to rule out gallstone pancreatitis although I believe most likely due to alcohol.   [CV]    Clinical Course User Index [CV] VAlfred LevinsCKent[place] MD   _________________________ 7:36 PM on 11/04/2017 -----------------------------------------  Ultrasound negative for gallstone pancreatitis.  Patient remains extremely well-appearing with a low risk based on Ranson criteria for any  acute complications.  He is going to be discharged home on bland diet, Percocet and Zofran for symptom relief.  Discussed return precautions for new or worsening pain, fever, vomiting.  Otherwise h will follow up with his primary care doctor in 2 to 3 days.e   As part of my medical decision making, I reviewed the following data within the [facility] notes reviewed and incorporated, Labs reviewed , Old chart reviewed, Radiograph reviewed , Notes from prior ED visits and [place] Controlled Substance Database    Pertinent labs & imaging results that were available during my care of the patient were reviewed by me and considered in my medical decision making (see chart for details).    ____________________________________________   FINAL CLINICAL IMPRESSION(S) / ED DIAGNOSES  Final diagnoses:  Pancreatitis      NEW MEDICATIONS STARTED DURING THIS VISIT:  ED Discharge Orders        Ordered     oxyCODONE-acetaminophen (PERCOCET) 5-325 MG tablet  Every 4 hours PRN     11/04/17 1935    ondansetron (ZOFRAN ODT) 4 MG disintegrating tablet  Every 8 hours PRN     11/04/17 1935       Note:  This document was prepared using Dragon voice recognition software and may include unintentional dictation errors.    Alfred Levins [place], MD 11/04/17 [phone]

## 2019-08-05 ENCOUNTER — Emergency Department (HOSPITAL_COMMUNITY)
Admission: EM | Admit: 2019-08-05 | Discharge: 2019-08-06 | Disposition: A | Discharge status: Other Acute Inpatient Hospital | Payer: Managed Care, Other (non HMO) | Attending: Emergency Medicine | Admitting: Emergency Medicine

## 2019-08-05 DIAGNOSIS — F1020 Alcohol dependence, uncomplicated: Secondary | ICD-10-CM | POA: Diagnosis not present

## 2019-08-05 DIAGNOSIS — R45850 Homicidal ideations: Secondary | ICD-10-CM | POA: Insufficient documentation

## 2019-08-05 DIAGNOSIS — F332 Major depressive disorder, recurrent severe without psychotic features: Secondary | ICD-10-CM | POA: Insufficient documentation

## 2019-08-05 DIAGNOSIS — Z20822 Contact with and (suspected) exposure to covid-19: Secondary | ICD-10-CM | POA: Insufficient documentation

## 2019-08-05 DIAGNOSIS — F10920 Alcohol use, unspecified with intoxication, uncomplicated: Secondary | ICD-10-CM

## 2019-08-05 DIAGNOSIS — Z79899 Other long term (current) drug therapy: Secondary | ICD-10-CM | POA: Diagnosis not present
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
## 2019-08-06 ENCOUNTER — Other Ambulatory Visit: Payer: Self-pay

## 2019-08-06 ENCOUNTER — Encounter (HOSPITAL_COMMUNITY): Payer: Self-pay | Admitting: Nurse Practitioner

## 2019-08-06 ENCOUNTER — Ambulatory Visit (HOSPITAL_COMMUNITY): Payer: Self-pay

## 2019-08-06 ENCOUNTER — Observation Stay (HOSPITAL_BASED_OUTPATIENT_CLINIC_OR_DEPARTMENT_OTHER)
Admission: AD | Admit: 2019-08-06 | Discharge: 2019-08-07 | Disposition: A | Discharge status: Home / Self Care | Payer: Managed Care, Other (non HMO) | Source: Intra-hospital | Attending: Psychiatry | Admitting: Psychiatry

## 2019-08-06 DIAGNOSIS — Z79899 Other long term (current) drug therapy: Secondary | ICD-10-CM | POA: Insufficient documentation

## 2019-08-06 DIAGNOSIS — F332 Major depressive disorder, recurrent severe without psychotic features: Secondary | ICD-10-CM | POA: Insufficient documentation

## 2019-08-06 DIAGNOSIS — Z79890 Hormone replacement therapy (postmenopausal): Secondary | ICD-10-CM | POA: Insufficient documentation

## 2019-08-06 DIAGNOSIS — F419 Anxiety disorder, unspecified: Secondary | ICD-10-CM | POA: Insufficient documentation

## 2019-08-06 LAB — COMPREHENSIVE METABOLIC PANEL
ALT: 27 U/L (ref 0–44)
AST: 23 U/L (ref 15–41)
Albumin: 4.7 g/dL (ref 3.5–5.0)
Alkaline Phosphatase: 82 U/L (ref 38–126)
Anion gap: 12 (ref 5–15)
BUN: 10 mg/dL (ref 6–20)
CO2: 26 mmol/L (ref 22–32)
Calcium: 9.7 mg/dL (ref 8.9–10.3)
Chloride: 99 mmol/L (ref 98–111)
Creatinine, Ser: 0.91 mg/dL (ref 0.61–1.24)
GFR calc Af Amer: >60 mL/min (ref >60)
GFR calc non Af Amer: >60 mL/min (ref >60)
Glucose, Bld: 109 mg/dL — ABNORMAL HIGH (ref 70–99)
Potassium: 4.3 mmol/L (ref 3.5–5.1)
Sodium: 137 mmol/L (ref 135–145)
Total Bilirubin: 0.3 mg/dL (ref 0.3–1.2)
Total Protein: 7.7 g/dL (ref 6.5–8.1)

## 2019-08-06 LAB — RAPID URINE DRUG SCREEN, HOSP PERFORMED
Amphetamines: NOT DETECTED
Barbiturates: NOT DETECTED
Benzodiazepines: NOT DETECTED
Cocaine: NOT DETECTED
Opiates: NOT DETECTED
Tetrahydrocannabinol: NOT DETECTED

## 2019-08-06 LAB — CBC
HCT: 46.1 % (ref 39.0–52.0)
Hemoglobin: 15.6 g/dL (ref 13.0–17.0)
MCH: 34.4 pg — ABNORMAL HIGH (ref 26.0–34.0)
MCHC: 33.8 g/dL (ref 30.0–36.0)
MCV: 101.8 fL — ABNORMAL HIGH (ref 80.0–100.0)
Platelets: 288 10*3/uL (ref 150–400)
RBC: 4.53 MIL/uL (ref 4.22–5.81)
RDW: 13.3 % (ref 11.5–15.5)
WBC: 5.6 10*3/uL (ref 4.0–10.5)
nRBC: 0 % (ref 0.0–0.2)

## 2019-08-06 LAB — RESPIRATORY PANEL BY RT PCR (FLU A&B, COVID)
Influenza A by PCR: NEGATIVE
Influenza B by PCR: NEGATIVE
SARS Coronavirus 2 by RT PCR: NEGATIVE

## 2019-08-06 LAB — ACETAMINOPHEN LEVEL: Acetaminophen (Tylenol), Serum: <10 ug/mL — ABNORMAL LOW (ref 10–30)

## 2019-08-06 LAB — ETHANOL: Alcohol, Ethyl (B): 164 mg/dL — ABNORMAL HIGH (ref <10)

## 2019-08-06 LAB — SALICYLATE LEVEL: Salicylate Lvl: <7 mg/dL — ABNORMAL LOW (ref 7.0–30.0)

## 2019-08-06 MED ORDER — ARIPIPRAZOLE 2 MG PO TABS
2.0000 mg | ORAL_TABLET | Freq: Every morning | ORAL | Status: DC
  Administered 2019-08-06 – 2019-08-07 (×2): 2 mg via ORAL
  Filled 2019-08-06 (×2): qty 1

## 2019-08-06 MED ORDER — TRAZODONE HCL 50 MG PO TABS: 50.0000 mg | ORAL_TABLET | Freq: Every evening | ORAL | Status: DC | PRN

## 2019-08-06 MED ORDER — NALTREXONE HCL 50 MG PO TABS
25.0000 mg | ORAL_TABLET | Freq: Two times a day (BID) | ORAL | Status: DC
  Administered 2019-08-06 – 2019-08-07 (×2): 25 mg via ORAL
  Filled 2019-08-06 (×2): qty 1

## 2019-08-06 MED ORDER — ARIPIPRAZOLE 2 MG PO TABS
2.0000 mg | ORAL_TABLET | Freq: Every morning | ORAL | Status: DC
  Filled 2019-08-06: qty 1

## 2019-08-06 MED ORDER — ALUM & MAG HYDROXIDE-SIMETH 200-200-20 MG/5ML PO SUSP: 30.0000 mL | ORAL | Status: DC | PRN

## 2019-08-06 MED ORDER — THIAMINE HCL 100 MG PO TABS: 100.0000 mg | ORAL_TABLET | Freq: Every day | ORAL | Status: DC

## 2019-08-06 MED ORDER — LORAZEPAM 1 MG PO TABS: 0.0000 mg | ORAL_TABLET | Freq: Two times a day (BID) | ORAL | Status: DC

## 2019-08-06 MED ORDER — ESCITALOPRAM OXALATE 10 MG PO TABS: 20.0000 mg | ORAL_TABLET | Freq: Every day | ORAL | Status: DC

## 2019-08-06 MED ORDER — LORAZEPAM 2 MG/ML IJ SOLN: 0.0000 mg | Freq: Four times a day (QID) | INTRAMUSCULAR | Status: DC

## 2019-08-06 MED ORDER — LORAZEPAM 1 MG PO TABS
0.0000 mg | ORAL_TABLET | Freq: Four times a day (QID) | ORAL | Status: DC
  Administered 2019-08-06: 05:00:00 1 mg via ORAL
  Filled 2019-08-06: qty 1

## 2019-08-06 MED ORDER — LEVOTHYROXINE SODIUM 25 MCG PO TABS
25.0000 ug | ORAL_TABLET | Freq: Every day | ORAL | Status: DC
  Administered 2019-08-06 – 2019-08-07 (×2): 25 ug via ORAL
  Filled 2019-08-06 (×2): qty 1

## 2019-08-06 MED ORDER — MAGNESIUM HYDROXIDE 400 MG/5ML PO SUSP: 30.0000 mL | Freq: Every day | ORAL | Status: DC | PRN

## 2019-08-06 MED ORDER — ESCITALOPRAM OXALATE 10 MG PO TABS
20.0000 mg | ORAL_TABLET | Freq: Every day | ORAL | Status: DC
  Administered 2019-08-06 – 2019-08-07 (×2): 20 mg via ORAL
  Filled 2019-08-06 (×2): qty 2

## 2019-08-06 MED ORDER — ACETAMINOPHEN 325 MG PO TABS: 650.0000 mg | ORAL_TABLET | Freq: Four times a day (QID) | ORAL | Status: DC | PRN

## 2019-08-06 MED ORDER — THIAMINE HCL 100 MG/ML IJ SOLN: 100.0000 mg | Freq: Every day | INTRAMUSCULAR | Status: DC

## 2019-08-06 MED ORDER — LORAZEPAM 2 MG/ML IJ SOLN: 0.0000 mg | Freq: Two times a day (BID) | INTRAMUSCULAR | Status: DC

## 2019-08-06 MED ORDER — IBUPROFEN 400 MG PO TABS
400.0000 mg | ORAL_TABLET | Freq: Four times a day (QID) | ORAL | Status: DC | PRN
  Administered 2019-08-06: 400 mg via ORAL
  Filled 2019-08-06: qty 1

## 2019-08-06 MED ORDER — NALTREXONE HCL 50 MG PO TABS
25.0000 mg | ORAL_TABLET | Freq: Two times a day (BID) | ORAL | Status: DC
  Filled 2019-08-06 (×2): qty 1

## 2019-08-06 MED ORDER — LEVOTHYROXINE SODIUM 25 MCG PO TABS: 25.0000 ug | ORAL_TABLET | Freq: Every day | ORAL | Status: DC

## 2019-08-06 NOTE — Plan of Care (Signed)
[facility] Observation Crisis Plan     Reason for Crisis Plan:  Crisis Stabilization and Substance Abuse   Plan of Care:  Referral for Telepsychiatry/Psychiatric Consult  Family Support:      Current Living Environment:     Insurance:   Hospital Account    Name Acct ID Class Status Primary Coverage   Ian Munoz, Ian Munoz [id number] BEHAVIORAL HEALTH OBSERVATION Open None        Guarantor Account (for Hospital Account [id number])    Name Relation to Pt Service Area Active? Acct Type   Mellody Life Self [facility] Yes [facility]   Address Phone       [address] [place], [place] [address] [phone](H)          Coverage Information (for Hospital Account [id number])    Not on file      Legal Guardian:     Primary Care Provider:  Gracelyn Nurse, MD  Current Outpatient Providers:  unknown  Psychiatrist:     Counselor/Therapist:     Compliant with Medications:  Yes  Additional Information:   Prentice Docker 4/18/20212:30 PM

## 2019-08-06 NOTE — Patient Outreach (Signed)
[facility] met with the patient to provide substance use recovery support and provide information for substance use recovery resources. Patient reports a history of alcohol use and relapsed on 08/05/19 after 30 days of consecutive sobriety. Patient reports a recent residential substance use treatment admission at [facility] and recently was taking part in [facility] outpatient substance use treatment program as well. Patient current lives at a sober living house. Patient states that he currently attends NA meetings and plans to attend more meetings to build his substance use recovery support network. [facility] still provided the patient with information for substance use recovery resources including residential/outpatient substance use treatment center list, online/in-person AA meeting list, and [facility] contact information. [facility] strongly encouraged the patient to follow up with [facility] if needed for further help with getting connected to substance use recovery resources or any other help with [facility]PSS related substance use recovery outreach services.

## 2019-08-06 NOTE — ED Provider Notes (Signed)
[facility] EMERGENCY DEPARTMENT Provider Note   CSN: [id number] Arrival date & time: 08/05/19  2313     History Chief Complaint  Patient presents with  . Homicidal    Ian Munoz is a 39 y.o. male with a history of alcohol use disorder, cocaine use disorder, and cannabis use disorder who presents to the emergency department with a chief complaint of homicidal ideation.  The patient reports that he had a confrontation with his manager at work earlier [employer] after he was accused of stealing at his job.  He states that he handled the accusation poorly and was told to leave.  He was not fired at that time.  After the incident, he was having homicidal thoughts against his Production designer, theatre/television/film.  No plan.  After leaving work, he stopped it two stores on his way home to by alcohol.  He consumed 6 cans of "Bootleggers" earlier tonight.  Last drink was approximately 20:30.  He then went to an NA meeting at 23:00.  Following the meeting, he returned to the sober living house where he is currently residing and was questioned by another resident if he had been drinking alcohol, and he admitted to drinking alcohol.  He was advised that he needed to get help before he can return to the sober living house.  He believes that he can return to the recovery house after receiving mental health treatment.  He denies SI or auditory visual hallucinations.  No other symptoms at this time including fever, chills, cough, shortness of breath, chest pain, nausea, vomiting, diarrhea, seizure, or tremor.  He has a history of cocaine and marijuana use, but has been abstinent for 60 days.  Prior to drinking alcohol tonight, he had been sober for 30 days.  The history is provided by the patient. No language interpreter was used.       Past Medical History:  Diagnosis Date  . Depression   . Prostate enlargement     There are no problems to display for this patient.   Past Surgical History:  Procedure  Laterality Date  . EYE SURGERY    . WRIST SURGERY Right        No family history on file.  Social History   Tobacco Use  . Smoking status: Never Smoker  . Smokeless tobacco: Current User    Types: Chew  Substance Use Topics  . Alcohol use: No  . Drug use: No    Home Medications Prior to Admission medications   Medication Sig Start Date End Date Taking? Authorizing Provider  ARIPiprazole (ABILIFY) 2 MG tablet Take 2 mg by mouth every morning. 07/08/19  Yes [provider]  escitalopram (LEXAPRO) 20 MG tablet Take 1 tablet by mouth daily. Name brand only 07/20/19 07/14/20 Yes [provider]  ibuprofen (ADVIL) 200 MG tablet Take 600 mg by mouth every 6 (six) hours as needed for fever or moderate pain.   Yes [provider]  levothyroxine (SYNTHROID) 25 MCG tablet Take 25 mcg by mouth daily. 07/20/19  Yes [provider]  naltrexone (DEPADE) 50 MG tablet Take 25 mg by mouth 2 (two) times daily. 07/08/19  Yes [provider]  traZODone (DESYREL) 50 MG tablet Take 50 mg by mouth at bedtime as needed for sleep. 06/06/19  Yes [provider]    Allergies    Patient has no known allergies.  Review of Systems   Review of Systems  Constitutional: Negative for appetite change and fever.  Respiratory: Negative for shortness of breath.   Cardiovascular: Negative for chest pain.  Gastrointestinal: Negative for abdominal pain, diarrhea, nausea and vomiting.  Genitourinary: Negative for dysuria.  Musculoskeletal: Negative for back pain.  Skin: Negative for rash.  Allergic/Immunologic: Negative for immunocompromised state.  Neurological: Negative for headaches.  Psychiatric/Behavioral: Negative for confusion, dysphoric mood, hallucinations and suicidal ideas.       HI    Physical Exam Updated Vital Signs BP 97/70 (BP Location: Right Arm)   Pulse 95   Temp 98.4 F (36.9 C) (Oral)   Resp 16   SpO2 97%   Physical Exam Vitals and  nursing note reviewed.  Constitutional:      Appearance: He is well-developed.     Comments: Pleasant, cooperative male in no acute distress  HENT:     Head: Normocephalic.  Eyes:     Conjunctiva/sclera: Conjunctivae normal.     Comments: Bilateral eyes are injected.  Cardiovascular:     Rate and Rhythm: Normal rate and regular rhythm.     Heart sounds: No murmur.  Pulmonary:     Effort: Pulmonary effort is normal. No respiratory distress.     Breath sounds: No stridor. No wheezing, rhonchi or rales.  Chest:     Chest wall: No tenderness.  Abdominal:     General: There is no distension.     Palpations: Abdomen is soft.     Tenderness: There is no abdominal tenderness.  Musculoskeletal:     Cervical back: Neck supple.     Right lower leg: No edema.     Left lower leg: No edema.  Skin:    General: Skin is warm and dry.  Neurological:     Mental Status: He is alert.     Comments: Gait is normal.  Speech is not slurred.  Psychiatric:        Attention and Perception: He does not perceive auditory or visual hallucinations.        Mood and Affect: Mood is depressed. Affect is flat.        Speech: Speech normal.        Behavior: Behavior normal. Behavior is cooperative.        Thought Content: Thought content includes homicidal ideation. Thought content does not include suicidal ideation. Thought content does not include homicidal or suicidal plan.        Judgment: Judgment is impulsive.     ED Results / Procedures / Treatments   Labs (all labs ordered are listed, but only abnormal results are displayed) Labs Reviewed  COMPREHENSIVE METABOLIC PANEL - Abnormal; Notable for the following components:      Result Value   Glucose, Bld 109 (*)    All other components within normal limits  ETHANOL - Abnormal; Notable for the following components:   Alcohol, Ethyl (B) 164 (*)    All other components within normal limits  SALICYLATE LEVEL - Abnormal; Notable for the following  components:   Salicylate Lvl <7.0 (*)    All other components within normal limits  ACETAMINOPHEN LEVEL - Abnormal; Notable for the following components:   Acetaminophen (Tylenol), Serum <10 (*)    All other components within normal limits  CBC - Abnormal; Notable for the following components:   MCV 101.8 (*)    MCH 34.4 (*)    All other components within normal limits  RESPIRATORY PANEL BY RT PCR (FLU A&B, COVID)  RAPID URINE DRUG SCREEN, HOSP PERFORMED    EKG None  Radiology  No results found.  Procedures Procedures (including critical care time)  Medications Ordered in ED Medications  LORazepam (ATIVAN) injection 0-4 mg ( Intravenous See Alternative 08/06/19 0516)    Or  LORazepam (ATIVAN) tablet 0-4 mg (1 mg Oral Given 08/06/19 0516)  LORazepam (ATIVAN) injection 0-4 mg (has no administration in time range)    Or  LORazepam (ATIVAN) tablet 0-4 mg (has no administration in time range)  thiamine tablet 100 mg (has no administration in time range)    Or  thiamine (B-1) injection 100 mg (has no administration in time range)  traZODone (DESYREL) tablet 50 mg (has no administration in time range)  naltrexone (DEPADE) tablet 25 mg (25 mg Oral Not Given 08/06/19 0710)  levothyroxine (SYNTHROID) tablet 25 mcg (has no administration in time range)  escitalopram (LEXAPRO) tablet 20 mg (has no administration in time range)  ARIPiprazole (ABILIFY) tablet 2 mg (has no administration in time range)    ED Course  I have reviewed the triage vital signs and the nursing notes.  Pertinent labs & imaging results that were available during my care of the patient were reviewed by me and considered in my medical decision making (see chart for details).    MDM Rules/Calculators/A&P                      39 year old male with a history of alcohol use disorder, cocaine use disorder, and cannabis use disorder presenting with homicidal thoughts after he was accused of stealing by his Freight forwarder at  work earlier [employer].  Following the incident, he broke his 30 days of sobriety by drinking 6 cans of "Bootleggers".  He has been residing in a sober living home, and fellow residents found out earlier tonight that he had broken his sobriety.  He continues to endorse vague homicidal thoughts against his boss.  No SI or auditory visualizations.  Vital signs are normal.  Ethanol level is 164.  Labs are otherwise unremarkable.  He remains voluntary at this time.  Pt medically cleared at this time. Psych hold orders and home med orders placed. TTS consult pending; please see psych team notes for further documentation of care/dispo. Pt stable at time of med clearance.     Final Clinical Impression(s) / ED Diagnoses Final diagnoses:  Homicidal ideation  Acute alcoholic intoxication without complication [facility])    Rx / DC Orders ED Discharge Orders    None       Joanne Gavel, PA-C 08/06/19 0840    Fatima Blank, MD 08/10/19 2248

## 2019-08-06 NOTE — ED Notes (Signed)
Breakfast ordered 

## 2019-08-06 NOTE — ED Notes (Addendum)
Pt transferred from hallway 15 to room 47 for TTS

## 2019-08-06 NOTE — BH Assessment (Signed)
Tele Assessment Note   Patient Name: Ian Munoz MRN: [id number] Referring Physician: Frederik Pear, PA-C Location of Patient: [facility],  Location of Provider: Behavioral Health TTS Department  Ian Munoz is an 39 y.o. divorced male who presents unaccompanied to [facility] reporting alcohol use and thoughts of assaulting his manager at work. Pt says he has a history of using alcohol and had 30 days of sobriety following two weeks of inpatient treatment at [facility]. He also is being treated for depression and anxiety. Pt reports he works at [employer], site accused him of stealing and told him to leave but did not fire him. Pt says he is very angry and relapsed on alcohol, drinking six cans of "Bootleggers" tonight. He says he is having thoughts of assaulting his Production designer, theatre/television/film. He returned to the recovery house where he is residing and told the residents he drank alcohol. He also told them he intended to assault his manager, so they brought Pt to [facility].  Pt says he mood was stable prior to the confrontation with his manager but he acknowledges symptoms including social withdrawal, loss of interest in usual pleasures, fatigue, irritability, decreased concentration, decreased sleep, decreased appetite and feelings of guilt. He denies current suicidal ideation or history of suicide attempts. He denies intentional self-injurious behavior. He denies history of violence. He denies history of auditory or visual hallucinations. Pt reports he has a history of using cocaine and marijuana but has not used either in at least 60 days.  Pt reports he has moved four times in the past month. He identifies his stepmother, stepbrother, housemates and a male friend as supportive. He says he believes he can return to his recovery house after receiving mental health treatment. He says prior to going into treatment he was in school studying HVAC. He states he has one son who is being cared for by  Pt's ex-wife. Pt reports a maternal and paternal history of substance use and a paternal history of depression. Pt has received outpatient medication management through [facility] in [place].   Pt is dressed in hospital scrubs, alert and oriented x4. Pt speaks in a clear tone, at moderate volume and normal pace. Motor behavior appears normal. Eye contact is good. Pt's mood is depressed and affect is congruent with mood. Thought process is coherent and relevant. There is no indication Pt is currently responding to internal stimuli or experiencing delusional thought content. Pt was pleasant and cooperative throughout assessment. He says he continues to have thoughts of harming his Production designer, theatre/television/film. He is requesting to be admitted to [facility] "for a couple of days."    Diagnosis:  F33.2 Major depressive disorder, Recurrent episode, Severe F10.20 Alcohol use disorder  Past Medical History:  Past Medical History:  Diagnosis Date  . Depression   . Prostate enlargement     Past Surgical History:  Procedure Laterality Date  . EYE SURGERY    . WRIST SURGERY Right     Family History: No family history on file.  Social History:  reports that he has never smoked. His smokeless tobacco use includes chew. He reports that he does not drink alcohol or use drugs.  Additional Social History:  Alcohol / Drug Use Pain Medications: Denies abuse Prescriptions: Denies abuse Over the Counter: Denies abuse History of alcohol / drug use?: Yes Longest period of sobriety (when/how long): 30 days Negative Consequences of Use: Personal relationships Substance #1 Name of Substance 1: Alcohol 1 -  Age of First Use: Adolescent 1 - Amount (size/oz): Varies, drank 6 cans of 6.8 ounce, 05% alcoholic beverage 1 - Frequency: Pt reports relapsing tonight after 30 days sober 1 - Duration: Ongoing 1 - Last Use / Amount: 08/05/2019 Substance #2 Name of Substance 2: Cocaine (crack) 2 - Age of First Use: unknown 2 - Amount  (size/oz): unknown 2 - Frequency: unknown 2 - Duration: unknown 2 - Last Use / Amount: Last use 60 days ago Substance #3 Name of Substance 3: Marijuana 3 - Age of First Use: unknown 3 - Amount (size/oz): unknown 3 - Frequency: unknown 3 - Duration: unknown 3 - Last Use / Amount: Last use 30 days ago  CIWA: CIWA-Ar BP: 97/70 Pulse Rate: 95 COWS:    Allergies: No Known Allergies  Home Medications: (Not in a hospital admission)   OB/GYN Status:  No LMP for male patient.  General Assessment Data Location of Assessment: [facility] ED TTS Assessment: In system Is this a Tele or Face-to-Face Assessment?: Tele Assessment Is this an Initial Assessment or a Re-assessment for this encounter?: Initial Assessment Patient Accompanied by:: N/A Language Other than English: No Living Arrangements: Other (Comment)(Recovery house) What gender do you identify as?: Male Marital status: Divorced [place] name: NA Pregnancy Status: No Living Arrangements: Other (Comment)(Recovery house) Can pt return to current living arrangement?: Yes Admission Status: Voluntary Is patient capable of signing voluntary admission?: Yes Referral Source: Self/Family/Friend Insurance type: [place] Living Arrangements: Other (Comment)(Recovery house) Legal Guardian: Other:(Self) Name of Psychiatrist: [place] Name of Therapist: None  Education Status Is patient currently in school?: No Is the patient employed, unemployed or receiving disability?: Employed  Risk to self with the past 6 months Suicidal Ideation: No Has patient been a risk to self within the past 6 months prior to admission? : No Suicidal Intent: No Has patient had any suicidal intent within the past 6 months prior to admission? : No Is patient at risk for suicide?: No Suicidal Plan?: No Has patient had any suicidal plan within the past 6 months prior to admission? : No Access to Means: No What has been your use of drugs/alcohol  within the last 12 months?: Pt reports relapsing on alcohol. Hx of using cocaine and marijuana Previous Attempts/Gestures: No How many times?: 0 Other Self Harm Risks: None Triggers for Past Attempts: None known Intentional Self Injurious Behavior: None Family Suicide History: No Recent stressful life event(s): Other (Comment)(Relocation, recent recovery) Persecutory voices/beliefs?: No Depression: Yes Depression Symptoms: Isolating, Fatigue, Guilt, Loss of interest in usual pleasures, Feeling angry/irritable, Despondent Substance abuse history and/or treatment for substance abuse?: Yes Suicide prevention information given to non-admitted patients: Not applicable  Risk to Others within the past 6 months Homicidal Ideation: Yes-Currently Present Does patient have any lifetime risk of violence toward others beyond the six months prior to admission? : No Thoughts of Harm to Others: Yes-Currently Present Comment - Thoughts of Harm to Others: Thoughts of assaulting manager at work Current Homicidal Intent: No Current Homicidal Plan: No Access to Homicidal Means: No Identified Victim: Freight forwarder at work History of harm to others?: No Assessment of Violence: None Noted Violent Behavior Description: Pt denies history of violence Does patient have access to weapons?: No Criminal Charges Pending?: No Does patient have a court date: No Is patient on probation?: No  Psychosis Hallucinations: None noted Delusions: None noted  Mental Status Report Appearance/Hygiene: In scrubs Eye Contact: Good Motor Activity: Unremarkable Speech: Logical/coherent Level of  Consciousness: Alert Mood: Anxious, Guilty Affect: Appropriate to circumstance Anxiety Level: Minimal Thought Processes: Coherent, Relevant Judgement: Partial Orientation: Person, Place, Time, Situation, Appropriate for developmental age Obsessive Compulsive Thoughts/Behaviors: None  Cognitive Functioning Concentration:  Decreased Memory: Recent Intact, Remote Intact Is patient IDD: No Insight: Good Impulse Control: Fair Appetite: Fair Have you had any weight changes? : No Change Sleep: No Change Total Hours of Sleep: 8 Vegetative Symptoms: None  ADLScreening [facility] Assessment Services) Patient's cognitive ability adequate to safely complete daily activities?: Yes Patient able to express need for assistance with ADLs?: Yes Independently performs ADLs?: Yes (appropriate for developmental age)  Prior Inpatient Therapy Prior Inpatient Therapy: Yes Prior Therapy Dates: 06/2019 [facility] Reason for Treatment: Substance abuse, depression, anxiety  Prior Outpatient Therapy Prior Outpatient Therapy: Yes Prior Therapy Dates: Current Prior Therapy Facilty/Provider(s): [facility] Reason for Treatment: Depression, anxiety Does patient have an ACCT team?: No Does patient have Intensive In-House Services?  : No Does patient have [facility] services? : No Does patient have P4CC services?: No  ADL Screening (condition at time of admission) Patient's cognitive ability adequate to safely complete daily activities?: Yes Is the patient deaf or have difficulty hearing?: No Does the patient have difficulty seeing, even when wearing glasses/contacts?: No Does the patient have difficulty concentrating, remembering, or making decisions?: No Patient able to express need for assistance with ADLs?: Yes Does the patient have difficulty dressing or bathing?: No Independently performs ADLs?: Yes (appropriate for developmental age) Does the patient have difficulty walking or climbing stairs?: No Weakness of Legs: None Weakness of Arms/Hands: None       Abuse/Neglect Assessment (Assessment to be complete while patient is alone) Abuse/Neglect Assessment Can Be Completed: Yes Physical Abuse: Denies Verbal Abuse: Denies Sexual Abuse: Denies Exploitation of patient/patient's resources:  Denies Self-Neglect: Denies     Merchant navy officer (For Healthcare) Does Patient Have a Medical Advance Directive?: No Would patient like information on creating a medical advance directive?: No - Patient declined          Disposition: Gave clinical reports to Nira Conn, FNP who recommended Pt be observed and evaluated by psychiatry. Binnie Rail, [facility], confirmed observation bed 203 is available. Pt can be transferred pending negative COVID test. Notified Mia McDonald, PA-C of recommendation.  Disposition Initial Assessment Completed for this Encounter: Yes  This service was provided via telemedicine using a 2-way, interactive audio and video technology.  Names of all persons participating in this telemedicine service and their role in this encounter. Name: Ian Munoz Role: Patient  Name: Shela Commons, [facility] Role: TTS counselor         Harlin Rain Patsy Baltimore, [facility], [facility] Triage Specialist [phone]  Pamalee Leyden 08/06/2019 4:33 AM

## 2019-08-06 NOTE — ED Notes (Signed)
ALL Belongings - 2 labeled bags and 1 valuables envelope - Safe Transport - Pt aware.

## 2019-08-06 NOTE — Progress Notes (Signed)
39 yo caucasian male admitted for observation due to ETOH use and HI toward manager at his job for accusing him of stealing. At current, patient denies intent to harm self or others. Denies A/VH.  Pt states, I relapsed about 2 weeks ago on ETOH but I don't need detoxing from that. I just got upset because of my boss". Cooperative and calm throughout assessment. Quiet but calm and cooperative with staff. Q15 minute checks initiated for safety. Will continue to monitor.

## 2019-08-06 NOTE — ED Notes (Addendum)
Pt voiced understanding and agreement w/tx plan - accepted to [facility] - Signed consent form - Copy faxed to [facility] - Copy sent to Medical Records - Original placed in envelope for [facility]. Pt declined to notify family/friend even after much encouragement.

## 2019-08-07 DIAGNOSIS — F332 Major depressive disorder, recurrent severe without psychotic features: Secondary | ICD-10-CM

## 2019-08-07 NOTE — Progress Notes (Signed)
Pt discharged at 11:55; left with safe transport driver to go directly to his place of employment to work his shift today. Pt given follow up resources provided to pt for outpt services; discharge instructions given to pt; pt verbalized understanding, all personal belongings returned to pt upon discharge. Upon discharge, pt is alert and oriented to person, place, situation, and time, is calm and cooperative, pt denies suicidal and homicidal ideation, denies hallucinations, denies feelings of depression and anxiety. No distress noted or reported.

## 2019-08-07 NOTE — H&P (Signed)
Psychiatric Admission Assessment Adult  Patient Identification: Ian Munoz MRN:  [id number] Date of Evaluation:  08/07/2019 Chief Complaint:  Severe recurrent major depression without psychotic features (HCC) [F33.2] Principal Diagnosis: <principal problem not specified> Diagnosis:  Active Problems:   Severe recurrent major depression without psychotic features [facility])  History of Present Illness: Patient Name: Ian Munoz MRN: [id number] Referring Physician: Frederik Pear, PA-C Location of Patient: [facility],  Location of Provider: Behavioral Health TTS Department assessment note: Ian Munoz is an 39 y.o. divorced male who presents unaccompanied to [facility] ED reporting alcohol use and thoughts of assaulting his manager at work. Pt says he has a history of using alcohol and had 30 days of sobriety following two weeks of inpatient treatment at [facility]. He also is being treated for depression and anxiety. Pt reports he works at [employer], site accused him of stealing and told him to leave but did not fire him. Pt says he is very angry and relapsed on alcohol, drinking six cans of "Bootleggers" tonight. He says he is having thoughts of assaulting his Production designer, theatre/television/film. He returned to the recovery house where he is residing and told the residents he drank alcohol. He also told them he intended to assault his manager, so they brought Pt to [facility].  Pt says he mood was stable prior to the confrontation with his manager but he acknowledges symptoms including social withdrawal, loss of interest in usual pleasures, fatigue, irritability, decreased concentration, decreased sleep, decreased appetite and feelings of guilt. He denies current suicidal ideation or history of suicide attempts. He denies intentional self-injurious behavior. He denies history of violence. He denies history of auditory or visual hallucinations. Pt reports he has a history of using cocaine and marijuana but  has not used either in at least 60 days.  Pt reports he has moved four times in the past month. He identifies his stepmother, stepbrother, housemates and a male friend as supportive. He says he believes he can return to his recovery house after receiving mental health treatment. He says prior to going into treatment he was in school studying HVAC. He states he has one son who is being cared for by Pt's ex-wife. Pt reports a maternal and paternal history of substance use and a paternal history of depression. Pt has received outpatient medication management through [facility] in [place].   Pt is dressed in hospital scrubs, alert and oriented x4. Pt speaks in a clear tone, at moderate volume and normal pace. Motor behavior appears normal. Eye contact is good. Pt's mood is depressed and affect is congruent with mood. Thought process is coherent and relevant. There is no indication Pt is currently responding to internal stimuli or experiencing delusional thought content. Pt was pleasant and cooperative throughout assessment. He says he continues to have thoughts of harming his Production designer, theatre/television/film. He is requesting to be admitted to [facility] "for a couple of days."    Associated Signs/Symptoms: Depression Symptoms:  insomnia, (Hypo) Manic Symptoms:  Distractibility, Anxiety Symptoms:  n/a Psychotic Symptoms:  n/a PTSD Symptoms: NA Total Time spent with patient: 45 minutes  Past Psychiatric History: substance abuse  Is the patient at risk to self? No.  Has the patient been a risk to self in the past 6 months? No.  Has the patient been a risk to self within the distant past? No.  Is the patient a risk to others? Yes.    Has the patient been a risk to  others in the past 6 months? No.  Has the patient been a risk to others within the distant past? No.   Prior Inpatient Therapy:   Prior Outpatient Therapy:    Alcohol Screening: 1. How often do you have a drink containing alcohol?: Monthly or less 2. How  many drinks containing alcohol do you have on a typical day when you are drinking?: 5 or 6 3. How often do you have six or more drinks on one occasion?: Monthly AUDIT-C Score: 5 4. How often during the last year have you found that you were not able to stop drinking once you had started?: Less than monthly 5. How often during the last year have you failed to do what was normally expected from you becasue of drinking?: Never 6. How often during the last year have you needed a first drink in the morning to get yourself going after a heavy drinking session?: Less than monthly 7. How often during the last year have you had a feeling of guilt of remorse after drinking?: Weekly 8. How often during the last year have you been unable to remember what happened the night before because you had been drinking?: Less than monthly 9. Have you or someone else been injured as a result of your drinking?: No 10. Has a relative or friend or a doctor or another health worker been concerned about your drinking or suggested you cut down?: Yes, but not in the last year Alcohol Use Disorder Identification Test Final Score (AUDIT): 13 Alcohol Brief Interventions/Follow-up: Alcohol Education Substance Abuse History in the last 12 months:  Yes.   Consequences of Substance Abuse: NA Previous Psychotropic Medications: Yes  Psychological Evaluations: No  Past Medical History:  Past Medical History:  Diagnosis Date  . Depression   . Prostate enlargement     Past Surgical History:  Procedure Laterality Date  . EYE SURGERY    . WRIST SURGERY Right    Family History: History reviewed. No pertinent family history. Family Psychiatric  History: ukn Tobacco Screening:   Social History:  Social History   Substance and Sexual Activity  Alcohol Use No     Social History   Substance and Sexual Activity  Drug Use No    Additional Social History:                           Allergies:  No Known  Allergies Lab Results:  Results for orders placed or performed during the hospital encounter of 08/05/19 (from the past 48 hour(s))  Comprehensive metabolic panel     Status: Abnormal   Collection Time: 08/06/19 12:32 AM  Result Value Ref Range   Sodium 137 135 - 145 mmol/L   Potassium 4.3 3.5 - 5.1 mmol/L   Chloride 99 98 - 111 mmol/L   CO2 26 22 - 32 mmol/L   Glucose, Bld 109 (H) 70 - 99 mg/dL    Comment: Glucose reference range applies only to samples taken after fasting for at least 8 hours.   BUN 10 6 - 20 mg/dL   Creatinine, Ser 0.91 0.61 - 1.24 mg/dL   Calcium 9.7 8.9 - 10.3 mg/dL   Total Protein 7.7 6.5 - 8.1 g/dL   Albumin 4.7 3.5 - 5.0 g/dL   AST 23 15 - 41 U/L   ALT 27 0 - 44 U/L   Alkaline Phosphatase 82 38 - 126 U/L   Total Bilirubin 0.3 0.3 - 1.2 mg/dL  GFR calc non Af Amer >60 >60 mL/min   GFR calc Af Amer >60 >60 mL/min   Anion gap 12 5 - 15    Comment: Performed at [facility], [address] [place], [place] [address]  Ethanol     Status: Abnormal   Collection Time: 08/06/19 12:32 AM  Result Value Ref Range   Alcohol, Ethyl (B) 164 (H) <10 mg/dL    Comment: (NOTE) Lowest detectable limit for serum alcohol is 10 mg/dL. For medical purposes only. Performed at [facility], [address] [place], [place] [address]   Salicylate level     Status: Abnormal   Collection Time: 08/06/19 12:32 AM  Result Value Ref Range   Salicylate Lvl <7.0 (L) 7.0 - 30.0 mg/dL    Comment: Performed at [facility], [address] [place], [place] [address]  Acetaminophen level     Status: Abnormal   Collection Time: 08/06/19 12:32 AM  Result Value Ref Range   Acetaminophen (Tylenol), Serum <10 (L) 10 - 30 ug/mL    Comment: (NOTE) Therapeutic concentrations vary significantly. A range of 10-30 ug/mL  may be an effective concentration for many patients. However, some  are best treated at concentrations outside of this range. Acetaminophen concentrations  >150 ug/mL at 4 hours after ingestion  and >50 ug/mL at 12 hours after ingestion are often associated with  toxic reactions. Performed at [facility], [address] [place], [place] [address]   cbc     Status: Abnormal   Collection Time: 08/06/19 12:32 AM  Result Value Ref Range   WBC 5.6 4.0 - 10.5 K/uL   RBC 4.53 4.22 - 5.81 MIL/uL   Hemoglobin 15.6 13.0 - 17.0 g/dL   HCT 29.546.1 62.139.0 - 30.852.0 %   MCV 101.8 (H) 80.0 - 100.0 fL   MCH 34.4 (H) 26.0 - 34.0 pg   MCHC 33.8 30.0 - 36.0 g/dL   RDW 65.713.3 84.611.5 - 96.215.5 %   Platelets 288 150 - 400 K/uL   nRBC 0.0 0.0 - 0.2 %    Comment: Performed at [facility], [address] [place], [place] [address]  Rapid urine drug screen (hospital performed)     Status: None   Collection Time: 08/06/19 12:50 AM  Result Value Ref Range   Opiates NONE DETECTED NONE DETECTED   Cocaine NONE DETECTED NONE DETECTED   Benzodiazepines NONE DETECTED NONE DETECTED   Amphetamines NONE DETECTED NONE DETECTED   Tetrahydrocannabinol NONE DETECTED NONE DETECTED   Barbiturates NONE DETECTED NONE DETECTED    Comment: (NOTE) DRUG SCREEN FOR MEDICAL PURPOSES ONLY.  IF CONFIRMATION IS NEEDED FOR ANY PURPOSE, NOTIFY LAB WITHIN 5 DAYS. LOWEST DETECTABLE LIMITS FOR URINE DRUG SCREEN Drug Class                     Cutoff (ng/mL) Amphetamine and metabolites    1000 Barbiturate and metabolites    200 Benzodiazepine                 200 Tricyclics and metabolites     300 Opiates and metabolites        300 Cocaine and metabolites        300 THC                            50 Performed at [facility], [address]  [place], [place] [address]   Respiratory Panel by RT PCR (Flu A&B, Covid) - Nasopharyngeal Swab     Status: None   Collection Time: 08/06/19  4:58 AM   Specimen: Nasopharyngeal Swab  Result Value Ref Range   SARS Coronavirus 2 by RT PCR NEGATIVE NEGATIVE    Comment: (NOTE) SARS-CoV-2 target nucleic acids are NOT DETECTED. The  SARS-CoV-2 RNA is generally detectable in upper respiratoy specimens during the acute phase of infection. The lowest concentration of SARS-CoV-2 viral copies this assay can detect is 131 copies/mL. A negative result does not preclude SARS-Cov-2 infection and should not be used as the sole basis for treatment or other patient management decisions. A negative result may occur with  improper specimen collection/handling, submission of specimen other than nasopharyngeal swab, presence of viral mutation(s) within the areas targeted by this assay, and inadequate number of viral copies (<131 copies/mL). A negative result must be combined with clinical observations, patient history, and epidemiological information. The expected result is Negative. Fact Sheet for Patients:  https://www.moore.com/ Fact Sheet for Healthcare Providers:  https://www.young.biz/ This test is not yet ap proved or cleared by the [place] FDA and  has been authorized for detection and/or diagnosis of SARS-CoV-2 by FDA under an Emergency Use Authorization (EUA). This EUA will remain  in effect (meaning this test can be used) for the duration of the COVID-19 declaration under Section 564(b)(1) of the Act, 21 U.S.C. section 360bbb-3(b)(1), unless the authorization is terminated or revoked sooner.    Influenza A by PCR NEGATIVE NEGATIVE   Influenza B by PCR NEGATIVE NEGATIVE    Comment: (NOTE) The Xpert Xpress SARS-CoV-2/FLU/RSV assay is intended as an aid in  the diagnosis of influenza from Nasopharyngeal swab specimens and  should not be used as a sole basis for treatment. Nasal washings and  aspirates are unacceptable for Xpert Xpress SARS-CoV-2/FLU/RSV  testing. Fact Sheet for Patients: https://www.moore.com/ Fact Sheet for Healthcare Providers: https://www.young.biz/ This test is not yet approved or cleared by the [place] FDA  and  has been authorized for detection and/or diagnosis of SARS-CoV-2 by  FDA under an Emergency Use Authorization (EUA). This EUA will remain  in effect (meaning this test can be used) for the duration of the  Covid-19 declaration under Section 564(b)(1) of the Act, 21  U.S.C. section 360bbb-3(b)(1), unless the authorization is  terminated or revoked. Performed at [facility], [address] [place], [place] [address]     Blood Alcohol level:  Lab Results  Component Value Date   ETH 164 (H) 08/06/2019   ETH <5 08/05/2015    Metabolic Disorder Labs:  No results found for: HGBA1C, MPG No results found for: PROLACTIN No results found for: CHOL, TRIG, HDL, CHOLHDL, VLDL, LDLCALC  Current Medications: Current Facility-Administered Medications  Medication Dose Route Frequency Provider Last Rate Last Admin  . alum & mag hydroxide-simeth (MAALOX/MYLANTA) 200-200-20 MG/5ML suspension 30 mL  30 mL Oral Q4H PRN Nira Conn A, NP      . ARIPiprazole (ABILIFY) tablet 2 mg  2 mg Oral q morning - 10a Nira Conn A, NP   2 mg at 08/07/19 0843  . escitalopram (LEXAPRO) tablet 20 mg  20 mg Oral Daily Nira Conn A, NP   20 mg at 08/07/19 0844  . ibuprofen (ADVIL) tablet 400 mg  400 mg Oral Q6H PRN Oneta Rack, NP   400 mg at 08/06/19 1432  . levothyroxine (SYNTHROID) tablet 25 mcg  25 mcg Oral  Daily Nira Conn A, NP   25 mcg at 08/07/19 0645  . magnesium hydroxide (MILK OF MAGNESIA) suspension 30 mL  30 mL Oral Daily PRN Nira Conn A, NP      . naltrexone (DEPADE) tablet 25 mg  25 mg Oral BID Nira Conn A, NP   25 mg at 08/07/19 0844  . traZODone (DESYREL) tablet 50 mg  50 mg Oral QHS PRN Jackelyn Poling, NP       PTA Medications: Medications Prior to Admission  Medication Sig Dispense Refill Last Dose  . ARIPiprazole (ABILIFY) 2 MG tablet Take 2 mg by mouth every morning.     . escitalopram (LEXAPRO) 20 MG tablet Take 1 tablet by mouth daily. Name brand only     .  ibuprofen (ADVIL) 200 MG tablet Take 600 mg by mouth every 6 (six) hours as needed for fever or moderate pain.     Marland Kitchen levothyroxine (SYNTHROID) 25 MCG tablet Take 25 mcg by mouth daily.     . naltrexone (DEPADE) 50 MG tablet Take 25 mg by mouth 2 (two) times daily.     . traZODone (DESYREL) 50 MG tablet Take 50 mg by mouth at bedtime as needed for sleep.     Treatment Plan Summary: Daily contact with patient to assess and evaluate symptoms and progress in treatment and Medication management  Musculoskeletal: Strength & Muscle Tone: within normal limits Gait & Station: normal Patient leans: N/A  Psychiatric Specialty Exam: Physical Exam  Review of Systems  Blood pressure 114/72, pulse 70, temperature 98.4 F (36.9 C), temperature source Oral, resp. rate 17, height 5\' 2"  (1.575 m), weight 68 kg, SpO2 96 %.Body mass index is 27.44 kg/m.  General Appearance: Casual  Eye Contact:  Fair  Speech:  Normal Rate  Volume:  Decreased  Mood:  Euthymic  Affect:  Congruent  Thought Process:  Coherent and Goal Directed  Orientation:  Full (Time, Place, and Person)  Thought Content:  Rumination  Suicidal Thoughts:  No  Homicidal Thoughts:  No  Memory:  Immediate;   Poor Recent;   Fair Remote;   Fair  Judgement:  Fair  Insight:  Fair  Psychomotor Activity:  Normal  Concentration:  Concentration: Fair and Attention Span: Fair  Recall:  of Knowledge:  Fair  Language:  Fair  Akathisia:  Negative  Handed:  Right  AIMS (if indicated):     Assets:  Communication Skills Desire for Improvement  ADL's:  Intact  Cognition:  WNL  Sleep:       Observation Level/Precautions:  15 minute checks  Laboratory:  UDS  Psychotherapy:    Medications:    Consultations:    Discharge Concerns:    Estimated LOS:  Other:     Physician Treatment Plan for Primary Diagnosis: <principal problem not specified> Long Term Goal(s): Improvement in symptoms so as ready for discharge  Short Term  Goals: Ability to verbalize feelings will improve and Ability to demonstrate self-control will improve  Physician Treatment Plan for Secondary Diagnosis: Active Problems:   Severe recurrent major depression without psychotic features (HCC)  Long Term Goal(s): Improvement in symptoms so as ready for discharge  Short Term Goals: Ability to identify changes in lifestyle to reduce recurrence of condition will improve, Ability to disclose and discuss suicidal ideas and Ability to identify and develop effective coping behaviors will improve  I certify that inpatient services furnished can reasonably be expected to improve the patient's condition.  Malvin Johns, MD 4/19/20211:25 PM

## 2019-08-07 NOTE — Discharge Instructions (Signed)
For your behavioral health needs, you are advised to continue treatment with [facility]:       [facility]      [address]      [place], [place] [address]      [phone]

## 2019-08-07 NOTE — Discharge Summary (Signed)
Physician Discharge Summary Note  Patient:  Ian Munoz is an 39 y.o., male MRN:  [id number] DOB:  [birth year] Patient phone:  [phone] (home)  Patient address:   [address] [address],  Total Time spent with patient: 45 minutes  Date of Admission:  08/06/2019 Date of Discharge: 08/07/2019  Reason for Admission:    Ian Munoz is an 39 y.o. divorced male who presents unaccompanied to [facility] reporting alcohol use and thoughts of assaulting his manager at work. Pt says he has a history of using alcohol and had 30 days of sobriety following two weeks of inpatient treatment at [facility]. He also is being treated for depression and anxiety. Pt reports he works at [employer]er accused him of stealing and told him to leave but did not fire him. Pt says he is very angry and relapsed on alcohol, drinking six cans of "Bootleggers" tonight. He says he is having thoughts of assaulting his Freight forwarder. He returned to the recovery house where he is residing and told the residents he drank alcohol. He also told them he intended to assault his manager, so they brought Pt to [place].  Pt says he mood was stable prior to the confrontation with his manager but he acknowledges symptoms including social withdrawal, loss of interest in usual pleasures, fatigue, irritability, decreased concentration, decreased sleep, decreased appetite and feelings of guilt. He denies current suicidal ideation or history of suicide attempts. He denies intentional self-injurious behavior. He denies history of violence. He denies history of auditory or visual hallucinations. Pt reports he has a history of using cocaine and marijuana but has not used either in at least 60 days.  Pt reports he has moved four times in the past month. He identifies his stepmother, stepbrother, housemates and a male friend as supportive. He says he believes he can return to his recovery house after  receiving mental health treatment. He says prior to going into treatment he was in school studying HVAC. He states he has one son who is being cared for by Pt's ex-wife. Pt reports a maternal and paternal history of substance use and a paternal history of depression. Pt has received outpatient medication management through [facility] in [place].   Pt is dressed in hospital scrubs, alert and oriented x4. Pt speaks in a clear tone, at moderate volume and normal pace. Motor behavior appears normal. Eye contact is good. Pt's mood is depressed and affect is congruent with mood. Thought process is coherent and relevant. There is no indication Pt is currently responding to internal stimuli or experiencing delusional thought content. Pt was pleasant and cooperative throughout assessment. He says he continues to have thoughts of harming his Freight forwarder. He is requesting to be admitted to [facility] [place] "for a couple of days."    Principal Problem: <principal problem not specified> Discharge Diagnoses: Active Problems:   Severe recurrent major depression without psychotic features [facility])   Past Psychiatric History: see above  Past Medical History:  Past Medical History:  Diagnosis Date  . Depression   . Prostate enlargement     Past Surgical History:  Procedure Laterality Date  . EYE SURGERY    . WRIST SURGERY Right    Family History: History reviewed. No pertinent family history. Family Psychiatric  History: see above Social History:  Social History   Substance and Sexual Activity  Alcohol Use No     Social History   Substance and Sexual Activity  Drug Use No    Social History   Socioeconomic History  . Marital status: Married    Spouse name: Not on file  . Number of children: Not on file  . Years of education: Not on file  . Highest education level: Not on file  Occupational History  . Not on file  Tobacco Use  . Smoking status: Never Smoker  . Smokeless tobacco: Current User     Types: Chew  Substance and Sexual Activity  . Alcohol use: No  . Drug use: No  . Sexual activity: Yes    Birth control/protection: None  Other Topics Concern  . Not on file  Social History Narrative  . Not on file   Social Determinants of Health   Financial Resource Strain:   . Difficulty of Paying Living Expenses:   Food Insecurity:   . Worried About Programme researcher, broadcasting/film/video in the Last Year:   . Barista in the Last Year:   Transportation Needs:   . Freight forwarder (Medical):   Marland Kitchen Lack of Transportation (Non-Medical):   Physical Activity:   . Days of Exercise per Week:   . Minutes of Exercise per Session:   Stress:   . Feeling of Stress :   Social Connections:   . Frequency of Communication with Friends and Family:   . Frequency of Social Gatherings with Friends and Family:   . Attends Religious Services:   . Active Member of Clubs or Organizations:   . Attends Banker Meetings:   Marland Kitchen Marital Status:     Hospital Course:    As discussed patient was monitored in the observation ward, he displayed no dangerous behaviors.  On the morning of the 19th he was alert and oriented and cooperative he stated he did not need or want detox measures though his drug screen did show cocaine and fentanyl and he had been drinking, he has a history of cannabis use as well. He stated he had "cooled off" was no longer having any thoughts of harming others, could contract fully for safety and requested discharge.  Was offered rehab but stated he did not needed, states he will follow-up with his regular providers but did request that he be given appointments for psychiatry specifically recommended to have his primary care provider regulate his psychotropic medications   Musculoskeletal: Strength & Muscle Tone: within normal limits Gait & Station: normal Patient leans: N/A  Psychiatric Specialty Exam: Physical Exam  Review of Systems  Blood pressure 114/72, pulse 70,  temperature 98.4 F (36.9 C), temperature source Oral, resp. rate 17, height 5\' 2"  (1.575 m), weight 68 kg, SpO2 96 %.Body mass index is 27.44 kg/m.  General Appearance: Casual  Eye Contact:  Fair  Speech:  Normal Rate  Volume:  Decreased  Mood:  Euthymic  Affect:  Congruent  Thought Process:  Coherent and Goal Directed  Orientation:  Full (Time, Place, and Person)  Thought Content:  Rumination  Suicidal Thoughts:  No  Homicidal Thoughts:  No  Memory:  Immediate;   Poor Recent;   Fair Remote;   Fair  Judgement:  Fair  Insight:  Fair  Psychomotor Activity:  Normal  Concentration:  Concentration: Fair and Attention Span: Fair  Recall:  of Knowledge:  Fair  Language:  Fair  Akathisia:  Negative  Handed:  Right  AIMS (if indicated):     Assets:  Communication Skills Desire for Improvement  ADL's:  Intact  Cognition:  WNL  Sleep:           Has this patient used any form of tobacco in the last 30 days? (Cigarettes, Smokeless Tobacco, Cigars, and/or Pipes) Yes, No  Blood Alcohol level:  Lab Results  Component Value Date   ETH 164 (H) 08/06/2019   ETH <5 08/05/2015    Metabolic Disorder Labs:  No results found for: HGBA1C, MPG No results found for: PROLACTIN No results found for: CHOL, TRIG, HDL, CHOLHDL, VLDL, LDLCALC  See Psychiatric Specialty Exam and Suicide Risk Assessment completed by Attending Physician prior to discharge.  Discharge destination:  Home  Is patient on multiple antipsychotic therapies at discharge:  No   Has Patient had three or more failed trials of antipsychotic monotherapy by history:  No  Recommended Plan for Multiple Antipsychotic Therapies: Additional reason(s) for multiple antispychotic treatment:  n/a   Allergies as of 08/07/2019   No Known Allergies     Medication List    TAKE these medications     Indication  ARIPiprazole 2 MG tablet Commonly known as: ABILIFY Take 2 mg by mouth every morning.  Indication:  Psychomotor Agitation   ibuprofen 200 MG tablet Commonly known as: ADVIL Take 600 mg by mouth every 6 (six) hours as needed for fever or moderate pain.  Indication: Fever, Pain   levothyroxine 25 MCG tablet Commonly known as: SYNTHROID Take 25 mcg by mouth daily.  Indication: Underactive Thyroid   Lexapro 20 MG tablet Generic drug: escitalopram Take 1 tablet by mouth daily. Name brand only  Indication: Generalized Anxiety Disorder   naltrexone 50 MG tablet Commonly known as: DEPADE Take 25 mg by mouth 2 (two) times daily.  Indication: Opioid Dependence   traZODone 50 MG tablet Commonly known as: DESYREL Take 50 mg by mouth at bedtime as needed for sleep.  Indication: Major Depressive Disorder         Signed: Malvin Johns, MD 08/07/2019, 8:16 AM

## 2019-08-07 NOTE — Progress Notes (Signed)
Pt resting in bed through night with with eyes closed unlabored respirations. Safety maintained with q15 min rounds. Monitoring continues.  

## 2019-08-07 NOTE — Progress Notes (Signed)
Pt received at start of shift resting quietly in his room, soon after was talking with the pt's doctor. Pt denies suicidal and homicidal ideation, denies hallucinations. Pt is medication compliant, calm, and cooperative. Will continue to monitor pt per Q15 minute face checks and monitor for safety and progress.

## 2019-08-07 NOTE — BH Assessment (Signed)
[facility] Assessment Progress Note  Per Malvin Johns, MD, this voluntary pt does not require psychiatric hospitalization at this time.  Pt is to be discharged from the [facility] Observation Unit with recommendation to continue treatment with [facility] in [place], pt's community of residence.  This has been included in pt's discharge instructions.  Pt's nurse, Ander Slade, has been notified.  Doylene Canning, [place] [facility] Coordinator [phone]

## 2019-08-07 NOTE — Progress Notes (Signed)
D Alert and Oriented x3 denies SI or HI.   A Scheduled medications administered per Provider order. Support and encouragement provided. Routine safety checks conducted every 15 minutes. Patient notified to inform staff with problems or concerns.  R. No adverse drug reactions noted. Patient contracts for safety at this time. Will continue to monitor Patient.

## 2019-10-16 ENCOUNTER — Other Ambulatory Visit: Payer: Self-pay

## 2019-10-16 ENCOUNTER — Ambulatory Visit (INDEPENDENT_AMBULATORY_CARE_PROVIDER_SITE_OTHER): Payer: 59 | Admitting: Licensed Clinical Social Worker

## 2019-10-16 DIAGNOSIS — F1410 Cocaine abuse, uncomplicated: Secondary | ICD-10-CM

## 2019-10-16 DIAGNOSIS — F1020 Alcohol dependence, uncomplicated: Secondary | ICD-10-CM

## 2019-10-16 DIAGNOSIS — F1210 Cannabis abuse, uncomplicated: Secondary | ICD-10-CM

## 2019-10-16 DIAGNOSIS — F1994 Other psychoactive substance use, unspecified with psychoactive substance-induced mood disorder: Secondary | ICD-10-CM | POA: Diagnosis not present
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
## 2020-03-13 ENCOUNTER — Emergency Department (HOSPITAL_COMMUNITY): Payer: Managed Care, Other (non HMO)

## 2020-03-13 ENCOUNTER — Emergency Department (HOSPITAL_COMMUNITY)
Admission: EM | Admit: 2020-03-13 | Discharge: 2020-03-13 | Disposition: A | Discharge status: Home / Self Care | Payer: Managed Care, Other (non HMO) | Attending: Emergency Medicine | Admitting: Emergency Medicine

## 2020-03-13 ENCOUNTER — Encounter (HOSPITAL_COMMUNITY): Payer: Self-pay | Admitting: Emergency Medicine

## 2020-03-13 DIAGNOSIS — R102 Pelvic and perineal pain: Secondary | ICD-10-CM | POA: Diagnosis present

## 2020-03-13 DIAGNOSIS — R1032 Left lower quadrant pain: Secondary | ICD-10-CM

## 2020-03-13 DIAGNOSIS — N50812 Left testicular pain: Secondary | ICD-10-CM | POA: Insufficient documentation

## 2020-03-13 DIAGNOSIS — F17220 Nicotine dependence, chewing tobacco, uncomplicated: Secondary | ICD-10-CM | POA: Diagnosis not present

## 2020-03-13 LAB — CBC
HCT: 47.2 % (ref 39.0–52.0)
Hemoglobin: 15.2 g/dL (ref 13.0–17.0)
MCH: 33.4 pg (ref 26.0–34.0)
MCHC: 32.2 g/dL (ref 30.0–36.0)
MCV: 103.7 fL — ABNORMAL HIGH (ref 80.0–100.0)
Platelets: 299 10*3/uL (ref 150–400)
RBC: 4.55 MIL/uL (ref 4.22–5.81)
RDW: 12.4 % (ref 11.5–15.5)
WBC: 9.1 10*3/uL (ref 4.0–10.5)
nRBC: 0 % (ref 0.0–0.2)

## 2020-03-13 LAB — BASIC METABOLIC PANEL
Anion gap: 9 (ref 5–15)
BUN: 9 mg/dL (ref 6–20)
CO2: 28 mmol/L (ref 22–32)
Calcium: 9.6 mg/dL (ref 8.9–10.3)
Chloride: 100 mmol/L (ref 98–111)
Creatinine, Ser: 0.9 mg/dL (ref 0.61–1.24)
GFR, Estimated: >60 mL/min (ref >60)
Glucose, Bld: 95 mg/dL (ref 70–99)
Potassium: 3.6 mmol/L (ref 3.5–5.1)
Sodium: 137 mmol/L (ref 135–145)

## 2020-03-13 LAB — URINALYSIS, ROUTINE W REFLEX MICROSCOPIC
Bilirubin Urine: NEGATIVE
Glucose, UA: NEGATIVE mg/dL
Hgb urine dipstick: NEGATIVE
Ketones, ur: NEGATIVE mg/dL
Leukocytes,Ua: NEGATIVE
Nitrite: NEGATIVE
Protein, ur: NEGATIVE mg/dL
Specific Gravity, Urine: 1.017 (ref 1.005–1.030)
pH: 5 (ref 5.0–8.0)

## 2020-03-13 MED ORDER — KETOROLAC TROMETHAMINE 30 MG/ML IJ SOLN: 30.0000 mg | Freq: Once | INTRAMUSCULAR | Status: DC

## 2020-03-13 MED ORDER — IBUPROFEN 400 MG PO TABS
400.0000 mg | ORAL_TABLET | Freq: Once | ORAL | Status: AC
  Administered 2020-03-13: 400 mg via ORAL
  Filled 2020-03-13: qty 1

## 2020-03-13 MED ORDER — ACETAMINOPHEN 500 MG PO TABS
1000.0000 mg | ORAL_TABLET | Freq: Once | ORAL | Status: AC
  Administered 2020-03-13: 1000 mg via ORAL
  Filled 2020-03-13: qty 2

## 2020-03-13 NOTE — Discharge Instructions (Addendum)
It was our pleasure to provide your ER care today - we hope that you feel better.  Overall your tests and ultrasound look good. Incidental note was made on ultrasound of small epididymal cyst, and varicocele.   Take acetaminophen or ibuprofen as need.  Follow up with primary care doctor in 1-2 weeks if symptoms fail to improve/resolve.  Return to ER if worse, new symptoms, fevers, worsening or severe pain, swelling/redness to area, or other concern.

## 2020-03-13 NOTE — ED Provider Notes (Signed)
Patient signed out to d/c to home when u/s resulted.   U/s neg for acute process. Small varicocele and cyst noted.   Abd is soft and non tender. No incarc hernia.   Ibuprofen po, acetaminophen po.   Pt comfortable, normal vitals, afebrile, reassuring exam and u/s, and appears stable for d/c.      Cathren Laine, MD 03/13/20 [phone]

## 2020-03-13 NOTE — ED Provider Notes (Signed)
[facility] EMERGENCY DEPARTMENT Provider Note   CSN: [id number] Arrival date & time: 03/13/20  0601     History Chief Complaint  Patient presents with  . Dysuria  . Groin Pain    Ian Munoz is a 39 y.o. male.  HPI     This is a 39 year old male with a history of prostatitis and varicocele who presents with groin pain, dysuria and urinary frequency.  Patient states that he has had a 1 week history of dysuria, frequency of urination.  He has also noted some left groin and testicle pain and swelling.  He states that he has a known varicocele on the left.  He has not had any fevers.  He has had some left flank pain.  No history of kidney stones.  Denies any new sexual partners.  States that he always uses condoms.  Denies any systemic symptoms.  Past Medical History:  Diagnosis Date  . Depression   . Prostate enlargement     Patient Active Problem List   Diagnosis Date Noted  . Severe recurrent major depression without psychotic features (HCC) 08/06/2019    Past Surgical History:  Procedure Laterality Date  . EYE SURGERY    . WRIST SURGERY Right        No family history on file.  Social History   Tobacco Use  . Smoking status: Never Smoker  . Smokeless tobacco: Current User    Types: Chew  Substance Use Topics  . Alcohol use: No  . Drug use: No    Home Medications Prior to Admission medications   Medication Sig Start Date End Date Taking? Authorizing Provider  ARIPiprazole (ABILIFY) 5 MG tablet Take 5 mg by mouth every morning.  07/08/19  Yes [provider]  escitalopram (LEXAPRO) 20 MG tablet Take 20 mg by mouth daily. Name brand only 07/20/19 07/14/20 Yes [provider]  ibuprofen (ADVIL) 200 MG tablet Take 600 mg by mouth every 6 (six) hours as needed for fever or moderate pain.   Yes [provider]  levothyroxine (SYNTHROID) 25 MCG tablet Take 25 mcg by mouth daily. 07/20/19   [provider]    traZODone (DESYREL) 50 MG tablet Take 50 mg by mouth at bedtime as needed for sleep. 06/06/19   [provider]    Allergies    Patient has no known allergies.  Review of Systems   Review of Systems  Constitutional: Negative for fever.  Respiratory: Negative for shortness of breath.   Cardiovascular: Negative for chest pain.  Gastrointestinal: Negative for abdominal pain, nausea and vomiting.  Genitourinary: Positive for dysuria, frequency, scrotal swelling and testicular pain.  All other systems reviewed and are negative.   Physical Exam Updated Vital Signs BP 133/83 (BP Location: Left Arm)   Pulse 98   Temp 98 F (36.7 C) (Oral)   Resp 16   SpO2 100%   Physical Exam Vitals and nursing note reviewed.  Constitutional:      Appearance: He is well-developed. He is not ill-appearing.  HENT:     Head: Normocephalic and atraumatic.     Mouth/Throat:     Mouth: Mucous membranes are moist.  Eyes:     Pupils: Pupils are equal, round, and reactive to light.  Cardiovascular:     Rate and Rhythm: Normal rate and regular rhythm.  Pulmonary:     Effort: Pulmonary effort is normal. No respiratory distress.  Abdominal:     General: Bowel sounds are normal.  Palpations: Abdomen is soft.     Tenderness: There is no abdominal tenderness. There is no rebound.  Genitourinary:    Comments: Normal circumcised penis, normal testicular lie, epididymal tenderness to palpation, positive cremasteric reflex, no bulges or hernia noted in the groin, no overlying skin changes Musculoskeletal:     Cervical back: Neck supple.  Lymphadenopathy:     Cervical: No cervical adenopathy.  Skin:    General: Skin is warm and dry.  Neurological:     Mental Status: He is alert and oriented to person, place, and time.  Psychiatric:        Mood and Affect: Mood normal.     ED Results / Procedures / Treatments   Labs (all labs ordered are listed, but only abnormal results are  displayed) Labs Reviewed  URINALYSIS, ROUTINE W REFLEX MICROSCOPIC  BASIC METABOLIC PANEL  CBC  GC/CHLAMYDIA PROBE AMP ([place]) NOT AT [facility]    EKG None  Radiology No results found.  Procedures Procedures (including critical care time)  Medications Ordered in ED Medications  ketorolac (TORADOL) 30 MG/ML injection 30 mg (has no administration in time range)    ED Course  I have reviewed the triage vital signs and the nursing notes.  Pertinent labs & imaging results that were available during my care of the patient were reviewed by me and considered in my medical decision making (see chart for details).    MDM Rules/Calculators/A&P                           Patient presents with dysuria, frequency, left testicle pain.  He is overall nontoxic and vital signs are reassuring.  Exam without evidence of a hernia.  Considerations include but not limited to, torsion, epididymitis, UTI, prostatitis.  Urinalysis ordered as well as basic lab work.  Additionally ultrasound of scrotum.  He is nontoxic-appearing and afebrile.  Doubt systemic infection.  Final Clinical Impression(s) / ED Diagnoses Final diagnoses:  Pain in left testicle    Rx / DC Orders ED Discharge Orders    None       Kyre Jeffries, Mayer Masker, MD 03/13/20 [phone]

## 2020-03-13 NOTE — ED Triage Notes (Signed)
Pt reports hx of prostatitis a few years ago, reports recent difficulty urinating and groin pain. Denies any penile discharge.

## 2020-04-10 ENCOUNTER — Emergency Department (HOSPITAL_COMMUNITY): Payer: Commercial Managed Care - HMO

## 2020-04-10 ENCOUNTER — Encounter (HOSPITAL_COMMUNITY): Payer: Self-pay | Admitting: Emergency Medicine

## 2020-04-10 ENCOUNTER — Emergency Department (HOSPITAL_COMMUNITY)
Admission: EM | Admit: 2020-04-10 | Discharge: 2020-04-10 | Discharge status: Against Medical Advice | Payer: Commercial Managed Care - HMO

## 2020-04-10 ENCOUNTER — Other Ambulatory Visit: Payer: Self-pay

## 2020-04-10 ENCOUNTER — Emergency Department (HOSPITAL_COMMUNITY)
Admission: EM | Admit: 2020-04-10 | Discharge: 2020-04-11 | Disposition: A | Discharge status: Home / Self Care | Payer: Commercial Managed Care - HMO | Attending: Emergency Medicine | Admitting: Emergency Medicine

## 2020-04-10 DIAGNOSIS — R079 Chest pain, unspecified: Secondary | ICD-10-CM | POA: Insufficient documentation

## 2020-04-10 DIAGNOSIS — R10814 Left lower quadrant abdominal tenderness: Secondary | ICD-10-CM | POA: Diagnosis not present

## 2020-04-10 DIAGNOSIS — Z79899 Other long term (current) drug therapy: Secondary | ICD-10-CM | POA: Insufficient documentation

## 2020-04-10 DIAGNOSIS — J3489 Other specified disorders of nose and nasal sinuses: Secondary | ICD-10-CM

## 2020-04-10 DIAGNOSIS — R42 Dizziness and giddiness: Secondary | ICD-10-CM | POA: Diagnosis not present

## 2020-04-10 DIAGNOSIS — R0789 Other chest pain: Secondary | ICD-10-CM

## 2020-04-10 LAB — CBC
HCT: 45.2 % (ref 39.0–52.0)
Hemoglobin: 15.9 g/dL (ref 13.0–17.0)
MCH: 35.3 pg — ABNORMAL HIGH (ref 26.0–34.0)
MCHC: 35.2 g/dL (ref 30.0–36.0)
MCV: 100.2 fL — ABNORMAL HIGH (ref 80.0–100.0)
Platelets: 315 10*3/uL (ref 150–400)
RBC: 4.51 MIL/uL (ref 4.22–5.81)
RDW: 12.9 % (ref 11.5–15.5)
WBC: 7 10*3/uL (ref 4.0–10.5)
nRBC: 0 % (ref 0.0–0.2)

## 2020-04-10 NOTE — ED Triage Notes (Signed)
Pt c/o chest pain that started this morning, a "potentially" broken nose from a fall on Tuesday and prostate issues.

## 2020-04-10 NOTE — ED Notes (Signed)
No answer from pt in waiting room for triage. °

## 2020-04-11 ENCOUNTER — Emergency Department (HOSPITAL_COMMUNITY): Payer: Commercial Managed Care - HMO

## 2020-04-11 LAB — BASIC METABOLIC PANEL
Anion gap: 11 (ref 5–15)
BUN: 5 mg/dL — ABNORMAL LOW (ref 6–20)
CO2: 25 mmol/L (ref 22–32)
Calcium: 9.1 mg/dL (ref 8.9–10.3)
Chloride: 106 mmol/L (ref 98–111)
Creatinine, Ser: 0.79 mg/dL (ref 0.61–1.24)
GFR, Estimated: >60 mL/min (ref >60)
Glucose, Bld: 111 mg/dL — ABNORMAL HIGH (ref 70–99)
Potassium: 3.9 mmol/L (ref 3.5–5.1)
Sodium: 142 mmol/L (ref 135–145)

## 2020-04-11 LAB — TROPONIN I (HIGH SENSITIVITY)
Troponin I (High Sensitivity): 2 ng/L (ref <18)
Troponin I (High Sensitivity): 3 ng/L (ref <18)

## 2020-04-11 NOTE — ED Notes (Signed)
Patient transported to XR. 

## 2020-04-11 NOTE — ED Provider Notes (Signed)
[facility] Provider Note   CSN: [id number] Arrival date & time: 04/10/20  2248     History Chief Complaint  Patient presents with   Chest Pain    Ian Munoz is a 39 y.o. male.  HPI   Patient with significant medical history of depression, enlarged prostate, polysubstance abuse presents to the emergency department with chief complaint of dizziness and chest pain.  Patient states initially he came here because over the last month he has felt dizzy going from a sitting to a standing position, the dizziness lasts for few seconds and then goes away on its own.  Describes the dizziness as the room spinning.  This only happens when he goes from seated to standing positions too quickly.  He states that on Tuesday this happened to him but he was intoxicated and he fell onto the ground.  Hitting his nose.  He said he may have broken his nose.  He denies headaches, change in vision, paresthesias or weakness in the upper or lower extremities, is currently anticoagulants.  Patient endorses while he was sitting in the emergency department he had some intermittent chest pain which is substernal, does not radiate, does not have associated shortness of breath, states that sometimes it is exacerbated with exertion, has no cardiac history, denies hormone use, pedal edema, DVT history.  Patient denies any alleviating factors.  He states he has some slight pain now but otherwise feels okay.  He denies headaches, fevers, chills, shortness of breath, chest pain, abdominal pain, nausea, vomiting, diarrhea, pedal edema.  Past Medical History:  Diagnosis Date   Depression    Prostate enlargement     Patient Active Problem List   Diagnosis Date Noted   Severe recurrent major depression without psychotic features (HCC) 08/06/2019    Past Surgical History:  Procedure Laterality Date   EYE SURGERY     WRIST SURGERY Right        No family history on  file.  Social History   Tobacco Use   Smoking status: Never Smoker   Smokeless tobacco: Current User    Types: Chew  Substance Use Topics   Alcohol use: No   Drug use: No    Home Medications Prior to Admission medications   Medication Sig Start Date End Date Taking? Authorizing Provider  ARIPiprazole (ABILIFY) 5 MG tablet Take 5 mg by mouth every morning.  07/08/19   [provider]  escitalopram (LEXAPRO) 20 MG tablet Take 20 mg by mouth daily. Name brand only 07/20/19 07/14/20  [provider]  ibuprofen (ADVIL) 200 MG tablet Take 600 mg by mouth every 6 (six) hours as needed for fever or moderate pain.    [provider]  levothyroxine (SYNTHROID) 25 MCG tablet Take 25 mcg by mouth daily. 07/20/19   [provider]  traZODone (DESYREL) 50 MG tablet Take 50 mg by mouth at bedtime as needed for sleep. 06/06/19   [provider]    Allergies    Patient has no known allergies.  Review of Systems   Review of Systems  Constitutional: Negative for chills and fever.  HENT: Negative for congestion, nosebleeds and sore throat.   Eyes: Negative for visual disturbance.  Respiratory: Negative for shortness of breath.   Cardiovascular: Positive for chest pain.  Gastrointestinal: Negative for abdominal pain, diarrhea, nausea and vomiting.  Genitourinary: Negative for enuresis.  Musculoskeletal: Negative for back pain.       Nose pain  Skin:  Negative for rash.  Neurological: Negative for headaches.  Hematological: Does not bruise/bleed easily.    Physical Exam Updated Vital Signs BP 123/86    Pulse 82    Temp 98.4 F (36.9 C) (Oral)    Resp 12    SpO2 97%   Physical Exam Vitals and nursing note reviewed.  Constitutional:      General: He is not in acute distress.    Appearance: Normal appearance. He is not ill-appearing or diaphoretic.  HENT:     Head: Normocephalic and atraumatic.     Right Ear: Tympanic membrane, ear canal and  external ear normal.     Left Ear: Tympanic membrane, ear canal and external ear normal.     Nose: Nose normal. No congestion or rhinorrhea.     Comments: Patient is no was visualized there is no gross abnormalities noted, is tender to palpation along the bridge of his nose, both nasal passages were patent, no noted blood within the nasal canals.    Mouth/Throat:     Mouth: Mucous membranes are moist.     Pharynx: Oropharynx is clear. No oropharyngeal exudate or posterior oropharyngeal erythema.  Eyes:     General: No visual field deficit or scleral icterus.    Extraocular Movements: Extraocular movements intact.     Conjunctiva/sclera: Conjunctivae normal.     Pupils: Pupils are equal, round, and reactive to light.  Cardiovascular:     Rate and Rhythm: Normal rate and regular rhythm.     Pulses: Normal pulses.     Heart sounds: No murmur heard. No friction rub. No gallop.   Pulmonary:     Effort: Pulmonary effort is normal. No respiratory distress.     Breath sounds: No wheezing, rhonchi or rales.  Abdominal:     Palpations: Abdomen is soft.     Tenderness: There is no right CVA tenderness or left CVA tenderness.     Comments: Patient's abdomen was nondistended he had slight tenderness to palpation in his left lower quadrant, no peritoneal sign, no rebound tenderness present.  Musculoskeletal:        General: No swelling or tenderness.     Right lower leg: No edema.     Left lower leg: No edema.     Comments: Patient is moving all 4 extremities at difficulty.  Skin:    General: Skin is warm and dry.  Neurological:     General: No focal deficit present.     Mental Status: He is alert.     GCS: GCS eye subscore is 4. GCS verbal subscore is 5. GCS motor subscore is 6.     Cranial Nerves: Cranial nerves are intact. No cranial nerve deficit or facial asymmetry.     Sensory: Sensation is intact. No sensory deficit.     Motor: Motor function is intact. No weakness or pronator drift.      Coordination: Coordination is intact. Romberg sign negative.     Comments: Patient is having no difficulty with word finding.  Psychiatric:        Mood and Affect: Mood normal.     ED Results / Procedures / Treatments   Labs (all labs ordered are listed, but only abnormal results are displayed) Labs Reviewed  BASIC METABOLIC PANEL - Abnormal; Notable for the following components:      Result Value   Glucose, Bld 111 (*)    BUN 5 (*)    All other components within normal limits  CBC - Abnormal;  Notable for the following components:   MCV 100.2 (*)    MCH 35.3 (*)    All other components within normal limits  TROPONIN I (HIGH SENSITIVITY)  TROPONIN I (HIGH SENSITIVITY)    EKG EKG Interpretation  Date/Time:  Wednesday April 10 2020 23:24:44 EST Ventricular Rate:  107 PR Interval:  132 QRS Duration: 80 QT Interval:  328 QTC Calculation: 437 R Axis:   39 Text Interpretation: Sinus tachycardia Otherwise normal ECG No significant change since last tracing Confirmed by Jacalyn Lefevre [phone]) on 04/11/2020 11:37:35 AM   Radiology DG Chest 2 View  Result Date: 04/10/2020 CLINICAL DATA:  Chest pain EXAM: CHEST - 2 VIEW COMPARISON:  09/18/2019 FINDINGS: The heart size and mediastinal contours are within normal limits. Both lungs are clear. The visualized skeletal structures are unremarkable. IMPRESSION: No active cardiopulmonary disease. Electronically Signed   By: Sharlet Salina M.D.   On: 04/10/2020 23:38    Procedures Procedures (including critical care time)  Medications Ordered in ED Medications - No data to display  ED Course  I have reviewed the triage vital signs and the nursing notes.  Pertinent labs & imaging results that were available during my care of the patient were reviewed by me and considered in my medical decision making (see chart for details).    MDM Rules/Calculators/A&P                          Patient presents with multitude of complaints,  broken nose, dizziness, chest pain, patient was alert, does not appear in acute distress, vital signs reassuring.  Will obtain chest pain work-up, imaging of nose and reevaluate.  Patient is reassessed, states he has no complaints at this time, vital signs remained stable.  CBC negative for leukocytosis or signs of anemia.  BMP negative for Electra abnormalities, no metabolic acidosis, hyperglycemia of 111, no AKI, no anion gap present.  Patient had a negative delta troponin.  Chest x-ray does not reveal any acute findings, no acute findings noted on x-ray of nasal bones.  EKG sinus tach without signs of ischemia no ST elevation or depression noted.  Low suspicion for CVA or intracranial head bleed as patient denies headaches, change in vision, paresthesia weakness upper or lower extremities, no neuro deficits on my exam. I have low suspicion for ACS as history is atypical, patient has no cardiac history, EKG was sinus rhythm without signs of ischemia, patient had a delta troponin.  Low suspicion for PE as patient denies pleuritic chest pain, shortness of breath, patient denies leg pain, no pedal edema noted on exam, vital signs reassuring. low suspicion for AAA or aortic dissection as history is atypical, patient has low risk factors.  Low suspicion for systemic infection as patient is nontoxic-appearing, vital signs reassuring, no obvious source infection noted on exam.  Low suspicion for nasal fracture as x-ray does not reveal any acute findings.  Differential diagnosis for chest pain includes GERD, esophageal spasms, esophageal strictures, muscle strain, costochondritis, anxiety.  Suspect patient's dizziness is secondary to positional orthostatics as it only occurs when he goes from sitting to standing position.  Will recommend fluid hydration follow-up with PCP for further evaluation.  Vital signs have remained stable, no indication for hospital admission.    Patient given at home care as well strict  return precautions.  Patient verbalized that they understood agreed to said plan.     Final Clinical Impression(s) / ED Diagnoses Final diagnoses:  None    Rx / DC Orders ED Discharge Orders    None       Barnie Del 04/11/20 1228    Jacalyn Lefevre, MD 04/11/20 [phone]

## 2020-04-11 NOTE — Discharge Instructions (Addendum)
Seen here for chest pain, nose pain, dizziness.  Lab work and imaging looks reassuring.  I recommend over-the-counter pain medications like ibuprofen or Tylenol every 6 as needed for pain please follow dosing on the back of bottle.  I also recommend staying hydrated and eating frequent snacks this can help decrease dizziness.  I recommend following up with your PCP for further evaluation.  Come back to the emergency department if you develop chest pain, shortness of breath, severe abdominal pain, uncontrolled nausea, vomiting, diarrhea.

## 2020-05-01 ENCOUNTER — Emergency Department (HOSPITAL_COMMUNITY)
Admission: EM | Admit: 2020-05-01 | Discharge: 2020-05-01 | Disposition: A | Discharge status: Home / Self Care | Payer: Commercial Managed Care - HMO | Attending: Emergency Medicine | Admitting: Emergency Medicine

## 2020-05-01 ENCOUNTER — Emergency Department (HOSPITAL_COMMUNITY): Payer: Commercial Managed Care - HMO

## 2020-05-01 ENCOUNTER — Other Ambulatory Visit: Payer: Self-pay

## 2020-05-01 ENCOUNTER — Encounter (HOSPITAL_COMMUNITY): Payer: Self-pay

## 2020-05-01 DIAGNOSIS — U071 COVID-19: Secondary | ICD-10-CM | POA: Insufficient documentation

## 2020-05-01 DIAGNOSIS — W19XXXA Unspecified fall, initial encounter: Secondary | ICD-10-CM

## 2020-05-01 DIAGNOSIS — S161XXA Strain of muscle, fascia and tendon at neck level, initial encounter: Secondary | ICD-10-CM | POA: Insufficient documentation

## 2020-05-01 DIAGNOSIS — Z20822 Contact with and (suspected) exposure to covid-19: Secondary | ICD-10-CM

## 2020-05-01 DIAGNOSIS — R519 Headache, unspecified: Secondary | ICD-10-CM | POA: Diagnosis present

## 2020-05-01 DIAGNOSIS — S39012A Strain of muscle, fascia and tendon of lower back, initial encounter: Secondary | ICD-10-CM | POA: Insufficient documentation

## 2020-05-01 DIAGNOSIS — W200XXA Struck by falling object in cave-in, initial encounter: Secondary | ICD-10-CM | POA: Diagnosis not present

## 2020-05-01 DIAGNOSIS — T148XXA Other injury of unspecified body region, initial encounter: Secondary | ICD-10-CM

## 2020-05-01 LAB — SARS CORONAVIRUS 2 (TAT 6-24 HRS): SARS Coronavirus 2: POSITIVE — AB

## 2020-05-01 MED ORDER — IBUPROFEN 800 MG PO TABS
800.0000 mg | ORAL_TABLET | Freq: Once | ORAL | Status: AC
  Administered 2020-05-01: 800 mg via ORAL
  Filled 2020-05-01: qty 1

## 2020-05-01 NOTE — Discharge Instructions (Addendum)
All of your x-rays and CAT scans were negative.  I suspect you have strained some muscles.  Follow-up with your primary care doctor.  Take Tylenol or Motrin for pain.  If your COVID test is positive, you will need to follow isolation precautions.

## 2020-05-01 NOTE — ED Provider Notes (Signed)
[facility]-EMERGENCY DEPT Provider Note   CSN: [id number] Arrival date & time: 05/01/20  0809     History Chief Complaint  Patient presents with  . Back Pain    Ian Munoz is a 40 y.o. male.  Reports 2 days ago he had a bookcase fall and hit him from behind.  Bookcase landed on the back of his neck, head.  States he did not lose consciousness but has had a headache and some lightheadedness ever since.  Also has had neck pain and upper back pain.  When he fell he hit his right knee on the ground.  Has been able to ambulate.  Denies any chronic medical conditions.  Denies any COVID symptoms but his sister has COVID symptoms.  HPI     Past Medical History:  Diagnosis Date  . Depression   . Prostate enlargement     Patient Active Problem List   Diagnosis Date Noted  . Severe recurrent major depression without psychotic features (HCC) 08/06/2019    Past Surgical History:  Procedure Laterality Date  . EYE SURGERY    . WRIST SURGERY Right        History reviewed. No pertinent family history.  Social History   Tobacco Use  . Smoking status: Never Smoker  . Smokeless tobacco: Current User    Types: Chew  Substance Use Topics  . Alcohol use: No  . Drug use: No    Home Medications Prior to Admission medications   Medication Sig Start Date End Date Taking? Authorizing Provider  ARIPiprazole (ABILIFY) 5 MG tablet Take 5 mg by mouth every morning.  07/08/19   [provider]  escitalopram (LEXAPRO) 20 MG tablet Take 20 mg by mouth daily. Name brand only 07/20/19 07/14/20  [provider]  ibuprofen (ADVIL) 200 MG tablet Take 600 mg by mouth every 6 (six) hours as needed for fever or moderate pain.    [provider]  levothyroxine (SYNTHROID) 25 MCG tablet Take 25 mcg by mouth daily. 07/20/19   [provider]  traZODone (DESYREL) 50 MG tablet Take 50 mg by mouth at bedtime as needed for sleep. 06/06/19   [provider]    Allergies    Patient has no known allergies.  Review of Systems   Review of Systems  Constitutional: Negative for chills and fever.  HENT: Negative for ear pain and sore throat.   Eyes: Negative for pain and visual disturbance.  Respiratory: Negative for cough and shortness of breath.   Cardiovascular: Negative for chest pain and palpitations.  Gastrointestinal: Negative for abdominal pain and vomiting.  Genitourinary: Negative for dysuria and hematuria.  Musculoskeletal: Positive for arthralgias, back pain and neck pain.  Skin: Negative for color change and rash.  Neurological: Positive for light-headedness and headaches. Negative for seizures and syncope.  All other systems reviewed and are negative.   Physical Exam Updated Vital Signs BP (!) 148/88 (BP Location: Right Arm)   Pulse 88   Temp 100.3 F (37.9 C) (Oral)   Resp 18   SpO2 100%   Physical Exam Vitals and nursing note reviewed.  Constitutional:      Appearance: He is well-developed and well-nourished.  HENT:     Head: Normocephalic and atraumatic.  Eyes:     Conjunctiva/sclera: Conjunctivae normal.  Neck:     Comments: Some tenderness over C-spine Cardiovascular:     Rate and Rhythm: Normal rate and regular rhythm.     Heart sounds:  No murmur heard.   Pulmonary:     Effort: Pulmonary effort is normal. No respiratory distress.     Breath sounds: Normal breath sounds.  Abdominal:     Palpations: Abdomen is soft.     Tenderness: There is no abdominal tenderness.  Musculoskeletal:        General: No edema.     Cervical back: Neck supple.     Comments: Back: Some tenderness over upper T-spine, no L-spine tenderness, no step-off or deformity RUE: no TTP throughout, no deformity, normal joint ROM, radial pulse intact, distal sensation and motor intact LUE: no TTP throughout, no deformity, normal joint ROM, radial pulse intact, distal sensation and motor intact RLE: Tenderness over right  knee, otherwise no TTP throughout, no deformity, normal joint ROM, distal pulse, sensation and motor intact LLE: no TTP throughout, no deformity, normal joint ROM, distal pulse, sensation and motor intact  Skin:    General: Skin is warm and dry.  Neurological:     Mental Status: He is alert.  Psychiatric:        Mood and Affect: Mood and affect normal.     ED Results / Procedures / Treatments   Labs (all labs ordered are listed, but only abnormal results are displayed) Labs Reviewed  SARS CORONAVIRUS 2 (TAT 6-24 HRS) - Abnormal; Notable for the following components:      Result Value   SARS Coronavirus 2 POSITIVE (*)    All other components within normal limits    EKG None  Radiology DG Thoracic Spine 2 View  Result Date: 05/01/2020 CLINICAL DATA:  40 year old male status post heavy lifting 2 days ago with head, shoulder and upper back pain. EXAM: THORACIC SPINE 2 VIEWS COMPARISON:  Chest radiographs 10/30/2015. FINDINGS: Normal thoracic segmentation. Mild chronic midthoracic dextroconvex scoliosis at T5-T6. No spondylolisthesis. Cervicothoracic junction alignment is within normal limits. Stable vertebral height and alignment since 2017. Disc spaces appear relatively preserved. There is anterior endplate spurring at the thoracolumbar junction and in the visible upper lumbar spine. Posterior ribs appear intact. Negative visible lungs and mediastinum. IMPRESSION: Chronic mild thoracic scoliosis appears stable since 2017. No acute osseous abnormality identified. Electronically Signed   By: Odessa Fleming M.D.   On: 05/01/2020 10:58   CT Head Wo Contrast  Result Date: 05/01/2020 CLINICAL DATA:  Head injury and neck pain after moving furniture 2 days ago. EXAM: CT HEAD WITHOUT CONTRAST CT CERVICAL SPINE WITHOUT CONTRAST TECHNIQUE: Multidetector CT imaging of the head and cervical spine was performed following the standard protocol without intravenous contrast. Multiplanar CT image reconstructions  of the cervical spine were also generated. COMPARISON:  August 03, 2017.  June 24, 2004. FINDINGS: CT HEAD FINDINGS Brain: No evidence of acute infarction, hemorrhage, hydrocephalus, extra-axial collection or mass lesion/mass effect. Vascular: No hyperdense vessel or unexpected calcification. Skull: Normal. Negative for fracture or focal lesion. Sinuses/Orbits: No acute finding. Other: None. CT CERVICAL SPINE FINDINGS Alignment: Normal. Skull base and vertebrae: No acute fracture. No primary bone lesion or focal pathologic process. Soft tissues and spinal canal: No prevertebral fluid or swelling. No visible canal hematoma. Disc levels:  Normal. Upper chest: Negative. Other: None. IMPRESSION: 1. Normal head CT. 2. Normal cervical spine. Electronically Signed   By: Lupita Raider M.D.   On: 05/01/2020 13:53   CT Cervical Spine Wo Contrast  Result Date: 05/01/2020 CLINICAL DATA:  Head injury and neck pain after moving furniture 2 days ago. EXAM: CT HEAD WITHOUT CONTRAST CT CERVICAL SPINE  WITHOUT CONTRAST TECHNIQUE: Multidetector CT imaging of the head and cervical spine was performed following the standard protocol without intravenous contrast. Multiplanar CT image reconstructions of the cervical spine were also generated. COMPARISON:  August 03, 2017.  June 24, 2004. FINDINGS: CT HEAD FINDINGS Brain: No evidence of acute infarction, hemorrhage, hydrocephalus, extra-axial collection or mass lesion/mass effect. Vascular: No hyperdense vessel or unexpected calcification. Skull: Normal. Negative for fracture or focal lesion. Sinuses/Orbits: No acute finding. Other: None. CT CERVICAL SPINE FINDINGS Alignment: Normal. Skull base and vertebrae: No acute fracture. No primary bone lesion or focal pathologic process. Soft tissues and spinal canal: No prevertebral fluid or swelling. No visible canal hematoma. Disc levels:  Normal. Upper chest: Negative. Other: None. IMPRESSION: 1. Normal head CT. 2. Normal cervical spine.  Electronically Signed   By: Lupita Raider M.D.   On: 05/01/2020 13:53   DG Chest Portable 1 View  Result Date: 05/01/2020 CLINICAL DATA:  Chest pain following fall EXAM: PORTABLE CHEST 1 VIEW COMPARISON:  April 10, 2020 FINDINGS: Lungs are clear. Heart size and pulmonary vascularity are normal. No adenopathy. No pneumothorax. There is midthoracic dextroscoliosis. IMPRESSION: Lungs clear.  Heart size normal. Electronically Signed   By: Bretta Bang III M.D.   On: 05/01/2020 10:56   DG Knee Right Port  Result Date: 05/01/2020 CLINICAL DATA:  Pain following fall EXAM: PORTABLE RIGHT KNEE - 1-2 VIEW COMPARISON:  None. FINDINGS: Frontal and lateral views were obtained. No fracture, dislocation, or joint effusion. Joint spaces appear normal. No erosive change. IMPRESSION: No evident fracture, dislocation, or joint effusion. No evident arthropathy. Electronically Signed   By: Bretta Bang III M.D.   On: 05/01/2020 10:56    Procedures Procedures (including critical care time)  Medications Ordered in ED Medications  ibuprofen (ADVIL) tablet 800 mg (800 mg Oral Given 05/01/20 1102)    ED Course  I have reviewed the triage vital signs and the nursing notes.  Pertinent labs & imaging results that were available during my care of the patient were reviewed by me and considered in my medical decision making (see chart for details).    MDM Rules/Calculators/A&P                         40 year old male presents to ER with concern for head pain, neck pain and back pain after bookcase hit him.  On exam well-appearing.  Given ongoing headache and lightheadedness, will check CT head.  CT C-spine for neck pain.  Plain films were negative, CTs negative.  Suspect MSK strain.  A secondary concern, patient's sister has COVID symptoms.  He was unaware of any fever or chills prior to arrival but had a temp of 100.3 on arrival.  Will send for PCR testing.  No other infectious symptoms, discussed return  precautions and isolation precautions.  After the discussed management above, the patient was determined to be safe for discharge.  The patient was in agreement with this plan and all questions regarding their care were answered.  ED return precautions were discussed and the patient will return to the ED with any significant worsening of condition.  Ian Munoz was evaluated in Emergency Department on 05/02/2020 for the symptoms described in the history of present illness. He was evaluated in the context of the global COVID-19 pandemic, which necessitated consideration that the patient might be at risk for infection with the SARS-CoV-2 virus that causes COVID-19. Institutional protocols and algorithms that pertain to  the evaluation of patients at risk for COVID-19 are in a state of rapid change based on information released by regulatory bodies including the CDC and federal and state organizations. These policies and algorithms were followed during the patient's care in the ED.    Final Clinical Impression(s) / ED Diagnoses Final diagnoses:  Suspected COVID-19 virus infection  Fall, initial encounter  Muscle strain    Rx / DC Orders ED Discharge Orders    None       Milagros Lollykstra, Sharde Gover S, MD 05/02/20 1427

## 2020-05-01 NOTE — ED Triage Notes (Signed)
Pt arrived via walk in, c/o upper back, head and shoulder pain after moving bookcase with friend x2 days ago, states it tipped and started to fall on him , hitting head and shoulders. No LOC, no blurred vision.

## 2020-07-10 ENCOUNTER — Telehealth: Payer: Commercial Managed Care - HMO | Admitting: Physician Assistant

## 2020-07-10 DIAGNOSIS — N390 Urinary tract infection, site not specified: Secondary | ICD-10-CM

## 2020-07-10 DIAGNOSIS — N4889 Other specified disorders of penis: Secondary | ICD-10-CM

## 2020-07-10 NOTE — Progress Notes (Signed)
Based on what you shared with me, I feel your condition warrants further evaluation and I recommend that you be seen for a face to face office visit.  Male bladder infections are not very common.  We worry about prostate or kidney conditions.  The standard of care is to examine the abdomen and kidneys, and to do a urine and blood test to make sure that something more serious is not going on.  We recommend that you see a provider today.  If your doctor's office is closed [facility] has the following Urgent Cares: Please do not delay care.    NOTE: If you entered your credit card information for this eVisit, you will not be charged. You may see a "hold" on your card for the $35 but that hold will drop off and you will not have a charge processed.   If you are having a true medical emergency please call 911.     For an urgent face to face visit,  has four urgent care centers for your convenience:    NEW:  [facility] [place] [phone] [address] [place], [place] [address] .  Monday - Friday 10 am - 6 pm    . [facility]    [phone]                  Get Driving Directions  [address] [place], [place] [address] . 10 am to 8 pm Monday-Friday . 12 pm to 8 pm Saturday-Sunday   . [facility] at [facility]  [phone]                  Get Driving Directions  [address] [place], [place] [address] . 8 am to 8 pm Monday-Friday . 9 am to 6 pm Saturday . 11 am to 6 pm Sunday   . [facility] at [facility]  [phone]                  Get Driving Directions   [address] [place], [place] [address] . 8 am to 8 pm Monday-Friday . 8 am to 4 pm Saturday-Sunday    . [facility] at [facility] Directions  [phone]  [address], [address] [place], [place] [address]  . Monday-Friday, 12 PM to 6  PM    Your e-visit answers were reviewed by a board certified advanced clinical practitioner to complete your personal care plan.  Thank you for using e-Visits.

## 2020-07-11 ENCOUNTER — Other Ambulatory Visit: Payer: Self-pay

## 2020-07-11 ENCOUNTER — Ambulatory Visit
Admission: RE | Admit: 2020-07-11 | Discharge: 2020-07-11 | Disposition: A | Discharge status: Home / Self Care | Payer: 59 | Source: Ambulatory Visit | Attending: Emergency Medicine | Admitting: Emergency Medicine

## 2020-07-11 VITALS — BP 116/81 | HR 103 | Temp 97.9°F | Resp 20

## 2020-07-11 DIAGNOSIS — N410 Acute prostatitis: Secondary | ICD-10-CM | POA: Insufficient documentation

## 2020-07-11 LAB — POCT URINALYSIS DIP (MANUAL ENTRY)
Bilirubin, UA: NEGATIVE
Glucose, UA: NEGATIVE mg/dL
Ketones, POC UA: NEGATIVE mg/dL
Leukocytes, UA: NEGATIVE
Nitrite, UA: NEGATIVE
Protein Ur, POC: NEGATIVE mg/dL
Spec Grav, UA: 1.025 (ref 1.010–1.025)
Urobilinogen, UA: 0.2 E.U./dL
pH, UA: 6 (ref 5.0–8.0)

## 2020-07-11 MED ORDER — SULFAMETHOXAZOLE-TRIMETHOPRIM 800-160 MG PO TABS: 1.0000 | ORAL_TABLET | Freq: Two times a day (BID) | ORAL | 0 refills | Status: AC

## 2020-07-11 NOTE — ED Triage Notes (Signed)
Pt presents with c/o difficult urination for that past month with dark urine, states he has had this problem on and off for years

## 2020-07-11 NOTE — Discharge Instructions (Signed)
Begin Bactrim twice daily for the next 2 weeks Follow-up with urology Drink plenty of fluids Follow-up if any symptoms not improving or worsening

## 2020-07-11 NOTE — ED Provider Notes (Signed)
[facility]    CSN: [id number] Arrival date & time: 07/11/20  8676      History   Chief Complaint Chief Complaint  Patient presents with  . Dysuria    HPI Ian Munoz is a 40 y.o. male presenting today for evaluation possible UTI.  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  Reports of recently has had increased difficulty with urination, darker urine and some occasional semen-like discharge with urination.  Occasionally with a tightening sensation in testicles.  Reports he has a stable varicocele.  Denies new partners or concerns for STDs.  Has not seen urology in over 4 years.  HPI  Past Medical History:  Diagnosis Date  . Depression   . Prostate enlargement     Patient Active Problem List   Diagnosis Date Noted  . Severe recurrent major depression without psychotic features (HCC) 08/06/2019    Past Surgical History:  Procedure Laterality Date  . EYE SURGERY    . WRIST SURGERY Right        Home Medications    Prior to Admission medications   Medication Sig Start Date End Date Taking? Authorizing Provider  sulfamethoxazole-trimethoprim (BACTRIM DS) 800-160 MG tablet Take 1 tablet by mouth 2 (two) times daily for 14 days. 07/11/20 07/25/20 Yes Sulo Janczak C, PA-C  ARIPiprazole (ABILIFY) 5 MG tablet Take 5 mg by mouth every morning.  07/08/19   [provider]  escitalopram (LEXAPRO) 20 MG tablet Take 20 mg by mouth daily. Name brand only 07/20/19 07/14/20  [provider]  ibuprofen (ADVIL) 200 MG tablet Take 600 mg by mouth every 6 (six) hours as needed for fever or moderate pain.    [provider]  levothyroxine (SYNTHROID) 25 MCG tablet Take 25 mcg by mouth daily. 07/20/19   [provider]  traZODone (DESYREL) 50 MG tablet Take 50 mg by mouth at bedtime as needed for sleep. 06/06/19   [provider]    Family History Family History  Family history  unknown: Yes    Social History Social History   Tobacco Use  . Smoking status: Never Smoker  . Smokeless tobacco: Current User    Types: Chew  Substance Use Topics  . Alcohol use: No  . Drug use: No     Allergies   Patient has no known allergies.   Review of Systems Review of Systems  Constitutional: Negative for fever.  HENT: Negative for sore throat.   Respiratory: Negative for shortness of breath.   Cardiovascular: Negative for chest pain.  Gastrointestinal: Negative for abdominal pain, nausea and vomiting.  Genitourinary: Positive for dysuria and frequency. Negative for difficulty urinating, penile discharge, penile pain, penile swelling, scrotal swelling and testicular pain.  Skin: Negative for rash.  Neurological: Negative for dizziness, light-headedness and headaches.     Physical Exam Triage Vital Signs ED Triage Vitals  Enc Vitals Group     BP      Pulse      Resp      Temp      Temp src      SpO2      Weight      Height      Head Circumference      Peak Flow      Pain Score      Pain Loc      Pain Edu?      Excl. in GC?    No data  found.  Updated Vital Signs BP 116/81   Pulse (!) 103   Temp 97.9 F (36.6 C)   Resp 20   SpO2 97%   Visual Acuity Right Eye Distance:   Left Eye Distance:   Bilateral Distance:    Right Eye Near:   Left Eye Near:    Bilateral Near:     Physical Exam Vitals and nursing note reviewed.  Constitutional:      Appearance: He is well-developed.     Comments: No acute distress  HENT:     Head: Normocephalic and atraumatic.     Nose: Nose normal.  Eyes:     Conjunctiva/sclera: Conjunctivae normal.  Cardiovascular:     Rate and Rhythm: Normal rate.  Pulmonary:     Effort: Pulmonary effort is normal. No respiratory distress.  Abdominal:     General: There is no distension.  Musculoskeletal:        General: Normal range of motion.     Cervical back: Neck supple.  Skin:    General: Skin is warm and  dry.  Neurological:     Mental Status: He is alert and oriented to person, place, and time.      UC Treatments / Results  Labs (all labs ordered are listed, but only abnormal results are displayed) Labs Reviewed  POCT URINALYSIS DIP (MANUAL ENTRY) - Abnormal; Notable for the following components:      Result Value   Blood, UA trace-intact (*)    All other components within normal limits  URINE CULTURE    EKG   Radiology No results found.  Procedures Procedures (including critical care time)  Medications Ordered in UC Medications - No data to display  Initial Impression / Assessment and Plan / UC Course  I have reviewed the triage vital signs and the nursing notes.  Pertinent labs & imaging results that were available during my care of the patient were reviewed by me and considered in my medical decision making (see chart for details).     Treating for prostatitis-Bactrim x2 weeks, push fluids, encouraged follow-up with primary care as well as refollowing up with urology given this episode worse than prior episodes.  Deferring STD screening.  Discussed strict return precautions. Patient verbalized understanding and is agreeable with plan.  Final Clinical Impressions(s) / UC Diagnoses   Final diagnoses:  Acute prostatitis     Discharge Instructions     Begin Bactrim twice daily for the next 2 weeks Follow-up with urology Drink plenty of fluids Follow-up if any symptoms not improving or worsening    ED Prescriptions    Medication Sig Dispense Auth. Provider   sulfamethoxazole-trimethoprim (BACTRIM DS) 800-160 MG tablet Take 1 tablet by mouth 2 (two) times daily for 14 days. 28 tablet Desmin Daleo, [place] C, PA-C     PDMP not reviewed this encounter.   Lew Dawes, PA-C 07/11/20 1045

## 2020-07-13 LAB — URINE CULTURE: Culture: <10000 — AB

## 2020-08-06 ENCOUNTER — Emergency Department (HOSPITAL_COMMUNITY): Payer: 59

## 2020-08-06 ENCOUNTER — Emergency Department (HOSPITAL_COMMUNITY)
Admission: EM | Admit: 2020-08-06 | Discharge: 2020-08-07 | Disposition: A | Discharge status: Other Acute Inpatient Hospital | Payer: 59 | Attending: Emergency Medicine | Admitting: Emergency Medicine

## 2020-08-06 ENCOUNTER — Encounter (HOSPITAL_COMMUNITY): Payer: Self-pay | Admitting: Emergency Medicine

## 2020-08-06 ENCOUNTER — Other Ambulatory Visit: Payer: Self-pay

## 2020-08-06 DIAGNOSIS — Y906 Blood alcohol level of 120-199 mg/100 ml: Secondary | ICD-10-CM | POA: Insufficient documentation

## 2020-08-06 DIAGNOSIS — R7401 Elevation of levels of liver transaminase levels: Secondary | ICD-10-CM | POA: Diagnosis not present

## 2020-08-06 DIAGNOSIS — N50811 Right testicular pain: Secondary | ICD-10-CM

## 2020-08-06 DIAGNOSIS — R000 Tachycardia, unspecified: Secondary | ICD-10-CM | POA: Insufficient documentation

## 2020-08-06 DIAGNOSIS — N50819 Testicular pain, unspecified: Secondary | ICD-10-CM

## 2020-08-06 DIAGNOSIS — R300 Dysuria: Secondary | ICD-10-CM | POA: Diagnosis not present

## 2020-08-06 DIAGNOSIS — N50812 Left testicular pain: Secondary | ICD-10-CM | POA: Diagnosis not present

## 2020-08-06 DIAGNOSIS — R45851 Suicidal ideations: Secondary | ICD-10-CM | POA: Insufficient documentation

## 2020-08-06 DIAGNOSIS — F1020 Alcohol dependence, uncomplicated: Secondary | ICD-10-CM | POA: Diagnosis present

## 2020-08-06 DIAGNOSIS — Z20822 Contact with and (suspected) exposure to covid-19: Secondary | ICD-10-CM | POA: Insufficient documentation

## 2020-08-06 DIAGNOSIS — F10220 Alcohol dependence with intoxication, uncomplicated: Secondary | ICD-10-CM

## 2020-08-06 LAB — COMPREHENSIVE METABOLIC PANEL
ALT: 383 U/L — ABNORMAL HIGH (ref 0–44)
AST: 138 U/L — ABNORMAL HIGH (ref 15–41)
Albumin: 3.8 g/dL (ref 3.5–5.0)
Alkaline Phosphatase: 108 U/L (ref 38–126)
Anion gap: 8 (ref 5–15)
BUN: 16 mg/dL (ref 6–20)
CO2: 25 mmol/L (ref 22–32)
Calcium: 8.7 mg/dL — ABNORMAL LOW (ref 8.9–10.3)
Chloride: 103 mmol/L (ref 98–111)
Creatinine, Ser: 0.87 mg/dL (ref 0.61–1.24)
GFR, Estimated: >60 mL/min (ref >60)
Glucose, Bld: 89 mg/dL (ref 70–99)
Potassium: 4 mmol/L (ref 3.5–5.1)
Sodium: 136 mmol/L (ref 135–145)
Total Bilirubin: 0.5 mg/dL (ref 0.3–1.2)
Total Protein: 6.8 g/dL (ref 6.5–8.1)

## 2020-08-06 LAB — CBC WITH DIFFERENTIAL/PLATELET
Abs Immature Granulocytes: 0.02 10*3/uL (ref 0.00–0.07)
Basophils Absolute: 0 10*3/uL (ref 0.0–0.1)
Basophils Relative: 1 %
Eosinophils Absolute: 0.3 10*3/uL (ref 0.0–0.5)
Eosinophils Relative: 6 %
HCT: 44.4 % (ref 39.0–52.0)
Hemoglobin: 14.8 g/dL (ref 13.0–17.0)
Immature Granulocytes: 0 %
Lymphocytes Relative: 35 %
Lymphs Abs: 1.8 10*3/uL (ref 0.7–4.0)
MCH: 34.8 pg — ABNORMAL HIGH (ref 26.0–34.0)
MCHC: 33.3 g/dL (ref 30.0–36.0)
MCV: 104.5 fL — ABNORMAL HIGH (ref 80.0–100.0)
Monocytes Absolute: 0.5 10*3/uL (ref 0.1–1.0)
Monocytes Relative: 10 %
Neutro Abs: 2.4 10*3/uL (ref 1.7–7.7)
Neutrophils Relative %: 48 %
Platelets: 222 10*3/uL (ref 150–400)
RBC: 4.25 MIL/uL (ref 4.22–5.81)
RDW: 12.7 % (ref 11.5–15.5)
WBC: 5 10*3/uL (ref 4.0–10.5)
nRBC: 0 % (ref 0.0–0.2)

## 2020-08-06 LAB — HEPATITIS PANEL, ACUTE
HCV Ab: NONREACTIVE
Hep A IgM: NONREACTIVE
Hep B C IgM: NONREACTIVE
Hepatitis B Surface Ag: NONREACTIVE

## 2020-08-06 LAB — URINALYSIS, ROUTINE W REFLEX MICROSCOPIC
Bilirubin Urine: NEGATIVE
Glucose, UA: NEGATIVE mg/dL
Hgb urine dipstick: NEGATIVE
Ketones, ur: NEGATIVE mg/dL
Leukocytes,Ua: NEGATIVE
Nitrite: NEGATIVE
Protein, ur: NEGATIVE mg/dL
Specific Gravity, Urine: 1.009 (ref 1.005–1.030)
pH: 5 (ref 5.0–8.0)

## 2020-08-06 LAB — RAPID URINE DRUG SCREEN, HOSP PERFORMED
Amphetamines: POSITIVE — AB
Barbiturates: NOT DETECTED
Benzodiazepines: NOT DETECTED
Cocaine: NOT DETECTED
Opiates: NOT DETECTED
Tetrahydrocannabinol: NOT DETECTED

## 2020-08-06 LAB — ACETAMINOPHEN LEVEL: Acetaminophen (Tylenol), Serum: <10 ug/mL — ABNORMAL LOW (ref 10–30)

## 2020-08-06 LAB — SALICYLATE LEVEL: Salicylate Lvl: <7 mg/dL — ABNORMAL LOW (ref 7.0–30.0)

## 2020-08-06 LAB — ETHANOL: Alcohol, Ethyl (B): 142 mg/dL — ABNORMAL HIGH (ref <10)

## 2020-08-06 LAB — RESP PANEL BY RT-PCR (FLU A&B, COVID) ARPGX2
Influenza A by PCR: NEGATIVE
Influenza B by PCR: NEGATIVE
SARS Coronavirus 2 by RT PCR: NEGATIVE

## 2020-08-06 MED ORDER — THIAMINE HCL 100 MG PO TABS
100.0000 mg | ORAL_TABLET | Freq: Every day | ORAL | Status: DC
  Administered 2020-08-06 – 2020-08-07 (×2): 100 mg via ORAL
  Filled 2020-08-06 (×2): qty 1

## 2020-08-06 MED ORDER — LORAZEPAM 2 MG/ML IJ SOLN
0.0000 mg | Freq: Two times a day (BID) | INTRAMUSCULAR | Status: DC
Start: 2020-08-08 — End: 2020-08-07

## 2020-08-06 MED ORDER — LORAZEPAM 1 MG PO TABS: 0.0000 mg | ORAL_TABLET | Freq: Two times a day (BID) | ORAL | Status: DC

## 2020-08-06 MED ORDER — LORAZEPAM 2 MG/ML IJ SOLN: 0.0000 mg | Freq: Four times a day (QID) | INTRAMUSCULAR | Status: DC

## 2020-08-06 MED ORDER — THIAMINE HCL 100 MG/ML IJ SOLN: 100.0000 mg | Freq: Every day | INTRAMUSCULAR | Status: DC

## 2020-08-06 MED ORDER — LORAZEPAM 1 MG PO TABS
0.0000 mg | ORAL_TABLET | Freq: Four times a day (QID) | ORAL | Status: DC
  Administered 2020-08-06: 1 mg via ORAL
  Administered 2020-08-06: 2 mg via ORAL
  Filled 2020-08-06: qty 2
  Filled 2020-08-06: qty 1

## 2020-08-06 NOTE — ED Provider Notes (Signed)
[facility] EMERGENCY DEPARTMENT Provider Note   CSN: [id number] Arrival date & time: 08/06/20  3335     History No chief complaint on file.   Ian Munoz is a 40 y.o. male.  Presents with SI, HI, Alcohol Dependence, Meth Use, and dysuria with testicular pain.  Alcohol Dependence, SI, HI Drinking 4-5 40oz Natural Lights a day throughout the day Had to stop throughout the day at work yesterday to drink multiple times  He would feel himself getting shaky and "out of it" yesterday when he was without alcohol Stopped between his work and the gas station to drink with a trashbag over his head in the rain to avoid withdrawing No prior hospitalizations for alcohol withdrawal, however has been to alcohol rehab He reports that he uses meth daily, last use was immediately prior to arrival Last drink of alcohol was immediately prior to arrival in the ED Also uses smokeless tobacco Denies any other drug use States that he has had thoughts of hurting himself over the last 2 weeks States that initially he had thought that he would slit his wrist, but yesterday had the thought that it would be faster to cut his neck Also having thoughts of harming others He says that there is nothing specific and no specific people He is endorsing a headache He states that he is not currently shaking because he just had a drink  Dysuria with testicular pain Patient reports a chronic history of off and on dysuria, penile discharge, pain in his testicles that has occurred over the last few years He reports that he has had having worsening testicular pain over the last month States that he had testicular trauma about 3 months ago and this pain has worsened with his episodes since then He was seen by urgent care in March, UA showed trace blood, urine culture ultimately had no growth He was treated with Bactrim and states that he never had improvement He has previously been seen by urology,  but has not seen them in over 4 years He denies any fevers Denies any flank pain No nausea or vomiting Denies any new sexual partners 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 Depression   . Prostate enlargement     Patient Active Problem List   Diagnosis Date Noted  . Severe recurrent major depression without psychotic features (HCC) 08/06/2019    Past Surgical History:  Procedure Laterality Date  . EYE SURGERY    . WRIST SURGERY Right        Family History  Family history unknown: Yes    Social History   Tobacco Use  . Smoking status: Never Smoker  . Smokeless tobacco: Current User    Types: Chew  Substance Use Topics  . Alcohol use: No  . Drug use: No    Home Medications Prior to Admission medications   Not on File    Allergies    Patient has no known allergies.  Review of Systems  Review of Systems  Constitutional: Negative for chills and fever.  HENT: Negative for congestion, rhinorrhea and sore throat.   Eyes: Negative for visual disturbance.  Respiratory: Positive for shortness of breath (DOE).   Cardiovascular: Negative for chest pain and leg swelling.  Gastrointestinal: Positive for diarrhea. Negative for abdominal pain, blood in stool, nausea, rectal pain and vomiting.  Genitourinary: Positive for dysuria, penile discharge and testicular pain. Negative for decreased urine volume, difficulty urinating, flank pain, genital sores and penile pain.  Musculoskeletal: Negative for arthralgias and back pain.   Neurological: Positive for tremors and headaches. Negative for seizures and syncope.  Psychiatric/Behavioral: Positive for suicidal ideas.    Physical Exam Updated Vital Signs BP 95/67   Pulse 89   Temp 98.6 F (37 C) (Oral)   Resp 18   SpO2 98%   Physical Exam Vitals reviewed. Exam conducted with a chaperone present.  Constitutional:      General: He is not in acute distress.    Appearance: He is not ill-appearing, toxic-appearing or diaphoretic.  HENT:     Head: Normocephalic and atraumatic.  Eyes:     General: No scleral icterus.    Pupils: Pupils are equal, round, and reactive to light.  Cardiovascular:     Rate and Rhythm: Regular rhythm. Tachycardia present.     Heart sounds: No murmur heard. No friction rub. No gallop.   Pulmonary:     Effort: Pulmonary effort is normal.     Breath sounds: Normal breath sounds. No wheezing, rhonchi or rales.  Abdominal:     General: Abdomen is flat.     Palpations: Abdomen is soft.     Tenderness: There is no abdominal tenderness. There is no right CVA tenderness, left CVA tenderness, guarding or rebound.  Genitourinary:    Penis: Normal.      Comments: Pain with palpation of left testicle, specifically in the most superior region, no inguinal hernia palpated  Prostate exam without obvious enlargement of prostate noted, reports TTP diffusely in rectal vault, not specifically over the prostate Musculoskeletal:     Right lower leg: No edema.     Left lower leg: No edema.  Skin:    General: Skin is warm and dry.     Comments: Diffuse small excoriations on lower abdomen and bilateral legs  Neurological:     Mental Status: He is alert and oriented to person, place, and time.     Sensory: No sensory deficit.     Motor: No weakness.     Comments: Mild tremor, no asterixis  Psychiatric:     Comments: Tangential thought process, endorses SI with plan, passive HI     ED Results / Procedures / Treatments   Labs (all labs ordered  are listed, but only abnormal results are displayed) Labs Reviewed  COMPREHENSIVE METABOLIC PANEL - Abnormal; Notable for the following components:      Result Value   Calcium 8.7 (*)    AST 138 (*)    ALT 383 (*)    All other components within normal limits  CBC WITH DIFFERENTIAL/PLATELET - Abnormal; Notable for the following components:   MCV 104.5 (*)    MCH 34.8 (*)    All other components within normal limits  ETHANOL - Abnormal; Notable for the following components:   Alcohol, Ethyl (B) 142 (*)    All other components within normal limits  SALICYLATE LEVEL - Abnormal; Notable for the following components:   Salicylate Lvl <7.0 (*)  All other components within normal limits  ACETAMINOPHEN LEVEL - Abnormal; Notable for the following components:   Acetaminophen (Tylenol), Serum <10 (*)    All other components within normal limits  URINALYSIS, ROUTINE W REFLEX MICROSCOPIC - Abnormal; Notable for the following components:   Color, Urine STRAW (*)    All other components within normal limits  RAPID URINE DRUG SCREEN, HOSP PERFORMED - Abnormal; Notable for the following components:   Amphetamines POSITIVE (*)    All other components within normal limits  URINE CULTURE  RESP PANEL BY RT-PCR (FLU A&B, COVID) ARPGX2  HEPATITIS PANEL, ACUTE    EKG None  Radiology DG Chest 1 View  Result Date: 08/06/2020 CLINICAL DATA:  Chest pain.  Shortness of breath. EXAM: CHEST  1 VIEW COMPARISON:  05/01/2020. FINDINGS: Mediastinum hilar structures normal. Lungs are clear. No pleural effusion or pneumothorax. Heart size normal. Thoracic spine scoliosis. No acute bony abnormality. IMPRESSION: No acute cardiopulmonary disease. Electronically Signed   By: Maisie Fushomas  Register   On: 08/06/2020 09:07   US SCROTUM W/DOPPLER  Result Date: 08/06/2020 CLINICAL DATA:  Left testicular pain for 10 years. EXAM: SCROTAL ULTRASOUND DOPPLER ULTRASOUND OF THE TESTICLES TECHNIQUE: Complete ultrasound examination  of the testicles, epididymis, and other scrotal structures was performed. Color and spectral Doppler ultrasound were also utilized to evaluate blood flow to the testicles. COMPARISON:  03/13/2020. FINDINGS: Right testicle Measurements: 3.7 x 1.9 x 2.5 cm. No mass or microlithiasis visualized. Left testicle Measurements: 3.5 x 1.6 x 2.2 cm. No mass or microlithiasis visualized. Right epididymis:  Not visualized Left epididymis:  3 mm epididymal cyst. Hydrocele:  None visualized. Varicocele:  Left varicocele, stable appearance from prior exam. Pulsed Doppler interrogation of both testes demonstrates normal low resistance arterial and venous waveforms bilaterally. IMPRESSION: 1.  Left varicocele stable appearance from prior exam. 2. No acute abnormality. No evidence of testicular mass or torsion. Electronically Signed   By: Maisie Fushomas  Register   On: 08/06/2020 09:15    Procedures Procedures   Medications Ordered in ED Medications  LORazepam (ATIVAN) injection 0-4 mg ( Intravenous See Alternative 08/06/20 0919)    Or  LORazepam (ATIVAN) tablet 0-4 mg (1 mg Oral Given 08/06/20 0919)  LORazepam (ATIVAN) injection 0-4 mg (has no administration in time range)    Or  LORazepam (ATIVAN) tablet 0-4 mg (has no administration in time range)  thiamine tablet 100 mg (100 mg Oral Given 08/06/20 0919)    Or  thiamine (B-1) injection 100 mg ( Intravenous See Alternative 08/06/20 0919)    ED Course  I have reviewed the triage vital signs and the nursing notes.  Pertinent labs & imaging results that were available during my care of the patient were reviewed by me and considered in my medical decision making (see chart for details).    MDM Rules/Calculators/A&P                          Patient is a 40 yo male with PMH depression and prostate enlargement who presents with Alcohol dependence, desire for treatment, SI, report of new-onset DOE yesterday and testicular pain with dysuria. He will be placed on SI  precautions given SI with a plan.  Will place on CIWAs, last drink immediately PTA.  Mild tremor on exam.  Obtain CMP, CBC, EtOH, Salicylate, Acetaminophen level, UDS.  No obvious hernia on testeicular examination or erythema, given remote history of trauma and significant tenderness, will obtain scrotal US.  UA and culture for dysuria, suspect intersitital cystitis as he was seen in March for the same, culture was negative, and he did not have improvement with bactrim.  Patient denies concern for STD.  He is tachycardic on exam which could be related to withdrawal or dehydration given his significant alcohol use.  Could also consdier that patient used methamphetamine immediately PTA.  Well's score of 1.5, low risk for PE.  Will obtain EKG and CXR, to further assess DOE, but very low suspicion for ACS at this time.   Small excoriations c/w known flea infestation at his house, f/u outpatient.  Scrotal ultrasound without acute abnormality, stable left varicocele.  CXR negative. EKG without acute changes.  Mild Transaminitis with AST 138, ALT 383, otherwise CMP WNL, CBC with macrocytosis, WBC and Hgb WNL.  UDS positive for methamphetamine, c/w report.  EtOH elevated at 142.  Salicylate and tylenol levels negative.  UA without evidence of infection.  Given chroniticity and lack of improvement with antibiotics and negative culture, consider interstitial cystitis.  F/U outpatient with urology.  Will order acute hepatitis panel and have patient f/u with PCP as outpatient.  Consult placed to TTS to evaluate SI.  Patient is medically stable for discharge.  Pending TTS evaluation.   Final Clinical Impression(s) / ED Diagnoses Final diagnoses:  Suicidal ideation  Alcohol dependence with uncomplicated intoxication (HCC)  Transaminitis  Dysuria    Rx / DC Orders ED Discharge Orders    None       Unknown Jim, DO 08/06/20 1353    Milagros Loll, MD 08/07/20 1246

## 2020-08-06 NOTE — ED Notes (Signed)
Pt belongs placed in purple zone. Visitors' Locker #2

## 2020-08-06 NOTE — ED Notes (Signed)
Pt placed in purple scrubs, pt and belongings wanded by security.  Pt transported to ultrasound

## 2020-08-06 NOTE — ED Triage Notes (Signed)
Pt here from home with c/o burning upon urination and painful urination along with some discharge , pt states no new sex partners, pt also states that he has been depressed lately and thoughts of SI and HI

## 2020-08-06 NOTE — ED Notes (Signed)
Reached out to behavioral health concerning pending TTS. Informed by TTS pt is on the list to be evaluated.

## 2020-08-06 NOTE — Discharge Instructions (Addendum)
As we discussed, your urine did not show signs of infection.  Given that this is a problem that has been going on for a long time, you should see a urologist.  Please call them as soon as you are discharged.  Your testicle ultrasound was normal.  We also discussed that your liver enzymes are elevated.  You should follow up with your regular doctor for this.  Continue to work on stopping alcohol.  If you stop drinking and experience nausea, vomiting, a lot of shaking, confusion, or seizures, you should come to the emergency room right away.

## 2020-08-07 ENCOUNTER — Other Ambulatory Visit: Payer: Self-pay

## 2020-08-07 ENCOUNTER — Ambulatory Visit (HOSPITAL_COMMUNITY)
Admission: EM | Admit: 2020-08-07 | Discharge: 2020-08-08 | Disposition: A | Discharge status: Home / Self Care | Payer: 59 | Attending: Psychiatry | Admitting: Psychiatry

## 2020-08-07 ENCOUNTER — Encounter (HOSPITAL_COMMUNITY): Payer: Self-pay | Admitting: Registered Nurse

## 2020-08-07 DIAGNOSIS — F1020 Alcohol dependence, uncomplicated: Secondary | ICD-10-CM | POA: Diagnosis not present

## 2020-08-07 DIAGNOSIS — F1994 Other psychoactive substance use, unspecified with psychoactive substance-induced mood disorder: Secondary | ICD-10-CM | POA: Diagnosis not present

## 2020-08-07 DIAGNOSIS — F332 Major depressive disorder, recurrent severe without psychotic features: Secondary | ICD-10-CM | POA: Diagnosis not present

## 2020-08-07 DIAGNOSIS — F1510 Other stimulant abuse, uncomplicated: Secondary | ICD-10-CM | POA: Diagnosis not present

## 2020-08-07 LAB — URINE CULTURE: Culture: NO GROWTH

## 2020-08-07 LAB — TSH: TSH: 2.254 u[IU]/mL (ref 0.350–4.500)

## 2020-08-07 MED ORDER — HYDROXYZINE HCL 25 MG PO TABS: 25.0000 mg | ORAL_TABLET | Freq: Three times a day (TID) | ORAL | Status: DC | PRN

## 2020-08-07 MED ORDER — ARIPIPRAZOLE 5 MG PO TABS
5.0000 mg | ORAL_TABLET | Freq: Once | ORAL | Status: AC
  Administered 2020-08-07: 5 mg via ORAL
  Filled 2020-08-07: qty 1

## 2020-08-07 MED ORDER — LORAZEPAM 1 MG PO TABS: 1.0000 mg | ORAL_TABLET | Freq: Four times a day (QID) | ORAL | Status: DC | PRN

## 2020-08-07 MED ORDER — TRAZODONE HCL 50 MG PO TABS: 50.0000 mg | ORAL_TABLET | Freq: Every evening | ORAL | Status: DC | PRN

## 2020-08-07 MED ORDER — HYDROXYZINE HCL 25 MG PO TABS: 25.0000 mg | ORAL_TABLET | Freq: Four times a day (QID) | ORAL | Status: DC | PRN

## 2020-08-07 MED ORDER — ESCITALOPRAM OXALATE 10 MG PO TABS
20.0000 mg | ORAL_TABLET | Freq: Every day | ORAL | Status: DC
  Administered 2020-08-07: 20 mg via ORAL
  Filled 2020-08-07: qty 2

## 2020-08-07 MED ORDER — ACETAMINOPHEN 325 MG PO TABS
650.0000 mg | ORAL_TABLET | Freq: Four times a day (QID) | ORAL | Status: DC | PRN
  Administered 2020-08-08: 650 mg via ORAL
  Filled 2020-08-07: qty 2

## 2020-08-07 MED ORDER — ESCITALOPRAM OXALATE 10 MG PO TABS
20.0000 mg | ORAL_TABLET | Freq: Once | ORAL | Status: AC
  Administered 2020-08-07: 20 mg via ORAL
  Filled 2020-08-07: qty 2

## 2020-08-07 MED ORDER — ALUM & MAG HYDROXIDE-SIMETH 200-200-20 MG/5ML PO SUSP: 30.0000 mL | ORAL | Status: DC | PRN

## 2020-08-07 MED ORDER — MAGNESIUM HYDROXIDE 400 MG/5ML PO SUSP: 30.0000 mL | Freq: Every day | ORAL | Status: DC | PRN

## 2020-08-07 MED ORDER — LOPERAMIDE HCL 2 MG PO CAPS: 2.0000 mg | ORAL_CAPSULE | ORAL | Status: DC | PRN

## 2020-08-07 MED ORDER — ADULT MULTIVITAMIN W/MINERALS CH
1.0000 | ORAL_TABLET | Freq: Every day | ORAL | Status: DC
  Administered 2020-08-07 – 2020-08-08 (×2): 1 via ORAL
  Filled 2020-08-07 (×2): qty 1

## 2020-08-07 MED ORDER — ARIPIPRAZOLE 5 MG PO TABS
5.0000 mg | ORAL_TABLET | Freq: Every day | ORAL | Status: DC
  Administered 2020-08-07: 5 mg via ORAL
  Filled 2020-08-07 (×2): qty 1

## 2020-08-07 MED ORDER — ONDANSETRON 4 MG PO TBDP: 4.0000 mg | ORAL_TABLET | Freq: Four times a day (QID) | ORAL | Status: DC | PRN

## 2020-08-07 MED ORDER — THIAMINE HCL 100 MG PO TABS
100.0000 mg | ORAL_TABLET | Freq: Every day | ORAL | Status: DC
  Administered 2020-08-08: 100 mg via ORAL
  Filled 2020-08-07: qty 1

## 2020-08-07 NOTE — ED Notes (Signed)
Patient arrived on unit.

## 2020-08-07 NOTE — ED Notes (Signed)
Pt sitting up on stretcher eating breakfast. Denies any c/o at this time.

## 2020-08-07 NOTE — Care Management (Signed)
Writer faxed referral to [facility] [phone]) intake to review for a possible placement tomorrow.

## 2020-08-07 NOTE — BH Assessment (Signed)
Comprehensive Clinical Assessment (CCA) Note  08/07/2020 Ian Munoz [id number] Disposition:  Gave clinical report to S. Rankin, NP, who determined that Pt is suitable for admission to [facility] for stabilization and medication evaluation.  The patient demonstrates the following risk factors for suicide: Chronic risk factors for suicide include: substance use disorder. Acute risk factors for suicide include: N/A. Protective factors for this patient include: responsibility to others (children, family). Considering these factors, the overall suicide risk at this point appears to be high. However, please note that this risk level may change as Pt stabilizes and sobers.  At this time, Patient is not appropriate for outpatient follow up.  Flowsheet Row ED from 08/06/2020 in [facility] EMERGENCY DEPARTMENT ED from 07/11/2020 in [facility] at [facility]  Admission (Discharged) from 08/06/2019 in BEHAVIORAL HEALTH OBSERVATION UNIT  C-SSRS RISK CATEGORY High Risk No Risk No Risk      Chief Complaint:  Chief Complaint  Patient presents with  . Alcohol Problem    Also endorsed SI and HI   Visit Diagnosis: Substance Induced Mood Disorder  NARRATIVE:  Pt is a 40 year old male who presented to [facility] on a voluntary basis with complaint of suicidal ideation, homicidal ideation, and substance use.  Pt lives in [place] with a roommate, and he is employed (works two jobs).  Pt does not have a psychiatrist, but he was prescribed citalopram and Abilify when discharged from [facility].  Pt was last assessed by TTS in April 2021.  At that time, Pt presented to [facility] with complaint of alcohol use and a desire to assault a Production designer, theatre/television/film at work.  He was discharged after 24 hours of observation.  Pt reported that for about two months, he has experienced the following symptoms:  Suicidal ideation with plan to cut his own throat; despondency; worry; and continued alcohol use and meth use.   Pt has been treated for substance use at [facility], and he has a history of relapses.  Pt's BAC on admission was 142, and he was positive for use of amphetamines.  Pt told me that he is not sure why he is suicidal, but that he has a history of despondency, suicidal ideation (and at least one suicide attempt), anxiety, and insomnia.  Pt also endorsed homicidal ideation (a general desire to harm other people).  He does not have access to firearms.  Pt stated that he came into the hospital last night because he was scared that he would harm himself.    When asked what help he wanted, Pt said that he would like to speak with a psychiatrist because he does not believe his medication is helping.  Pt denied hallucination and self-injurious behavior.  Pt admitted to using alcohol daily and to episodic use of meth.  Pt was guarded when asked about specifics of use, and he asked what other drugs may have appeared in his UDS.  When asked about trauma, Pt stated that he experienced bullying growing up, and that he continues to feel bullied.  When asked if he feels safe at home, Pt replied, ''Sometimes I do, sometimes I don't.''    During assessment, Pt presented as alert and oriented.  He had good eye contact.  Demeanor was guarded.  Pt was appropriately groomed.  Pt's mood was depressed, and affect was blunted.  Pt's speech was normal in rate, rhythm, and volume.  Thought processes were within normal range, and thought content was logical and goal-oriented.  There was no evidence of delusion.  Memory and concentration were intact.  Insight and judgment were fair.  Impulse control was deemed poor as evidenced by ongoing substance use.  CCA Screening, Triage and Referral (STR)  Patient Reported Information How did you hear about us? Self  Referral name: [name]  Referral phone number: No data recorded  Whom do you see for routine medical problems? Primary Care  [facility]  Practice/Facility Phone Number: No data recorded Name of Contact: No data recorded Contact Number: No data recorded Contact Fax Number: No data recorded Prescriber Name: No data recorded Prescriber Address (if known): No data recorded  What Is the Reason for Your Visit/Call Today? No data recorded How Long Has This Been Causing You Problems? 1-6 months  What Do You Feel Would Help You the Most Today? Alcohol or Drug Use Treatment; Treatment for Depression or other mood problem   Have You Recently Been in Any Inpatient Treatment (Hospital/Detox/Crisis Center/28-Day Program)? No  Name/Location of Program/Hospital:No data recorded How Long Were You There? No data recorded When Were You Discharged? No data recorded  Have You Ever Received Services From [facility] Before? Yes  Who Do You [facility] at [facility]? ED, TTS   Have You Recently Had Any Thoughts About Hurting Yourself? Yes  Are You Planning to Commit Suicide/Harm Yourself At This time? No   Have you Recently Had Thoughts About Hurting Someone Karolee Ohslse? Yes  Explanation: No data recorded  Have You Used Any Alcohol or Drugs in the Past 24 Hours? Yes  How Long Ago Did You Use Drugs or Alcohol? No data recorded What Did You Use and How Much? Alcohol -- 2 40 oz beers; meth -- ''as much as I need''   Do You Currently Have a Therapist/Psychiatrist? No data recorded Name of Therapist/Psychiatrist: No data recorded  Have You Been Recently Discharged From Any Office Practice or Programs? No data recorded Explanation of Discharge From Practice/Program: No data recorded    CCA Screening Triage Referral Assessment Type of Contact: Tele-Assessment  Is this Initial or Reassessment? Initial Assessment  Date Telepsych consult ordered in [facility]:  08/06/2020  Time Telepsych consult ordered in [facility]:  No data recorded  Patient Reported Information Reviewed? Yes  Patient Left Without Being Seen? No data recorded Reason for Not  Completing Assessment: No data recorded  Collateral Involvement: NA   Does Patient Have a Court Appointed Legal Guardian? No data recorded Name and Contact of Legal Guardian: No data recorded If Minor and Not Living with Parent(s), Who has Custody? No data recorded Is CPS involved or ever been involved? Never  Is APS involved or ever been involved? Never   Patient Determined To Be At Risk for Harm To Self or Others Based on Review of Patient Reported Information or Presenting Complaint? No data recorded Method: No data recorded Availability of Means: No data recorded Intent: No data recorded Notification Required: No data recorded Additional Information for Danger to Others Potential: No data recorded Additional Comments for Danger to Others Potential: No data recorded Are There Guns or Other Weapons in Your Home? No data recorded Types of Guns/Weapons: No data recorded Are These Weapons Safely Secured?                            No data recorded Who Could Verify You Are Able To Have These Secured: No data recorded Do You Have any Outstanding Charges,  Pending Court Dates, Parole/Probation? No data recorded Contacted To Inform of Risk of Harm To Self or Others: No data recorded  Location of Assessment: [facility]   Does Patient Present under Involuntary Commitment? No  IVC Papers Initial File Date: No data recorded  [place] of Residence: [place]   Patient Currently Receiving the Following Services: Medication Management (Meds prescribedd through [facility])   Determination of Need: Urgent (48 hours)   Options For Referral: Medication Management; BH Urgent Care; Chemical Dependency Intensive Outpatient Therapy (CDIOP); Intensive Outpatient Therapy; Inpatient Hospitalization     CCA Biopsychosocial Intake/Chief Complaint:  Pt presented to ED under influence of alcohol and meth; endorsed SI and HI  Current Symptoms/Problems: Pt endorsed suicidal ideation and homicidal  ideation; also endorsed recent substance use   Patient Reported Schizophrenia/Schizoaffective Diagnosis in Past: No   Strengths: Some insight  Preferences: following recommendations  Abilities: No data recorded  Type of Services Patient Feels are Needed: Medication management --''I need to get in front of a psychiatrist''   Initial Clinical Notes/Concerns: Pt endorsed SI, generalized HI (''just people''), denied AVH.  Pt's UDS was positive for amphetamines, BAC +142   Mental Health Symptoms Depression:  Hopelessness; Change in energy/activity; Sleep (too much or little); Increase/decrease in appetite; Irritability   Duration of Depressive symptoms: Greater than two weeks   Mania:  None   Anxiety:   Worrying   Psychosis:  None   Duration of Psychotic symptoms: No data recorded  Trauma:  None   Obsessions:  None   Compulsions:  N/A   Inattention:  None   Hyperactivity/Impulsivity:  N/A   Oppositional/Defiant Behaviors:  None   Emotional Irregularity:  Mood lability; Recurrent suicidal behaviors/gestures/threats; Potentially harmful impulsivity   Other Mood/Personality Symptoms:  '''I came in because I was scared that I would hurt myself or someone else''    Mental Status Exam Appearance and self-care  Stature:  Average   Weight:  Average weight   Clothing:  Casual   Grooming:  Normal   Cosmetic use:  None   Posture/gait:  Normal   Motor activity:  Not Remarkable   Sensorium  Attention:  Normal   Concentration:  Normal   Orientation:  X5   Recall/memory:  Normal   Affect and Mood  Affect:  Blunted   Mood:  Depressed   Relating  Eye contact:  Normal   Facial expression:  Responsive   Attitude toward examiner:  Guarded   Thought and Language  Speech flow: Clear and Coherent   Thought content:  Appropriate to Mood and Circumstances   Preoccupation:  None   Hallucinations:  None   Organization:  No data recorded  Dynegy of Knowledge:  Average   Intelligence:  Average   Abstraction:  Normal   Judgement:  Fair   Reality Testing:  Adequate   Insight:  Fair   Decision Making:  Normal   Social Functioning  Social Maturity:  Isolates   Social Judgement:  Normal; "Street Smart"   Stress  Stressors:  Armed forces operational officer (Upcoming court date for injury to personal property 5/16)   Coping Ability:  Overwhelmed   Skill Deficits:  Responsibility; Self-control   Supports:  Scientist, forensic system Systems analyst alum)     Religion: Religion/Spirituality Are You A Religious Person?: No  Leisure/Recreation: Leisure / Recreation Do You Have Hobbies?: No  Exercise/Diet: Exercise/Diet Do You Exercise?: No Have You Gained or Lost A Significant Amount of Weight in the Past Six Months?:  No Do You Follow a Special Diet?: No Do You Have Any Trouble Sleeping?: Yes Explanation of Sleeping Difficulties: Pt endorsed episodes of insomnia   CCA Employment/Education Employment/Work Situation: Employment / Work Situation Employment situation: Employed Where is patient currently employed?: [employer] long has patient been employed?: 20 years Patient's job has been impacted by current illness: Yes Describe how patient's job has been impacted: drinking at work What is the longest time patient has a held a job?: 20 years Where was the patient employed at that time?: [employer] Has patient ever been in the [employer]?: Yes (Describe in comment)  Education: Education Is Patient Currently Attending School?: No Last Grade Completed: 12 Did Garment/textile technologist From [employer]?: Yes Did Theme park manager?: Yes What Type of College Degree Do you Have?: HVAC program incomplete Did You Attend Graduate School?: No What Was Your Major?: HVAC program Did You Have An Individualized Education Program (IIEP): No Did You Have Any Difficulty At School?: No Patient's Education Has Been Impacted by Current Illness:  No   CCA Family/Childhood History Family and Relationship History: Family history Marital status: Divorced Divorced, when?: 2019 finalized What types of issues is patient dealing with in the relationship?: clt told wife was in recovery but didnt want to give up drinking 'gotta stop diong something to recover' major relapse while working in [facility] [employer] went to strip club then got DUI (3rd) Additional relationship information: clt reports wife did not allow contact with biological son for 3 years Are you sexually active?: No Has your sexual activity been affected by drugs, alcohol, medication, or emotional stress?: divorce related to inability to stop drinking Does patient have children?: Yes How many children?: 1 How is patient's relationship with their children?: Distant  Childhood History:  Childhood History By whom was/is the patient raised?: Mother,Mother/father and step-parent Additional childhood history information: bio father alcoholic never got to meet him Description of patient's relationship with caregiver when they were a child: good growing up; got along well Did patient suffer any verbal/emotional/physical/sexual abuse as a child?: No Did patient suffer from severe childhood neglect?: No Has patient ever been sexually abused/assaulted/raped as an adolescent or adult?: No Was the patient ever a victim of a crime or a disaster?: No Witnessed domestic violence?: No Has patient been affected by domestic violence as an adult?: No  Child/Adolescent Assessment:     CCA Substance Use Alcohol/Drug Use: Alcohol / Drug Use Pain Medications: Please see MAR Prescriptions: Please see MAR Over the Counter: Please see MAR History of alcohol / drug use?: Yes Negative Consequences of Use: Financial,Legal,Personal relationships,Work / School Withdrawal Symptoms: Tremors Substance #1 Name of Substance 1: Alcohol 1 - Amount (size/oz): Varied 1 - Frequency: Daily 1 - Duration: Ongoing 1 -  Last Use / Amount: 08/06/2020 -- 2 40 oz beers 1 - Method of Aquiring: Purchase 1- Route of Use: Ingestion Substance #2 Name of Substance 2: Meth 2 - Amount (size/oz): Varied 2 - Frequency: Episodic 2 - Duration: Not sure 2 - Last Use / Amount: 08/06/2020 -- ''as much as I needed''                     ASAM's:  Six Dimensions of Multidimensional Assessment  Dimension 1:  Acute Intoxication and/or Withdrawal Potential:   Dimension 1:  Description of individual's past and current experiences of substance use and withdrawal: Multiple relapses; has been treated at [facility]  Dimension 2:  Biomedical Conditions and Complications:   Dimension  2:  Description of patient's biomedical conditions and  complications: None indicated  Dimension 3:  Emotional, Behavioral, or Cognitive Conditions and Complications:  Dimension 3:  Description of emotional, behavioral, or cognitive conditions and complications: Depressed  Dimension 4:  Readiness to Change:  Dimension 4:  Description of Readiness to Change criteria: appears ready to change however hx of multiple relapses despite desire verbalized to change; listed multiple excuses when discussing possible start date  Dimension 5:  Relapse, Continued use, or Continued Problem Potential:  Dimension 5:  Relapse, continued use, or continued problem potential critiera description: frequent relapsing client reports most often around 30 days sober, recently hanging out with friends in a bar  Dimension 6:  Recovery/Living Environment:  Dimension 6:  Recovery/Iiving environment criteria description: Lives with roommate  ASAM Severity Score: ASAM's Severity Rating Score: 11  ASAM Recommended Level of Treatment: ASAM Recommended Level of Treatment: Level II Intensive Outpatient Treatment   Substance use Disorder (SUD) Substance Use Disorder (SUD)  Checklist Symptoms of Substance Use: Continued use despite having a persistent/recurrent physical/psychological problem  caused/exacerbated by use,Continued use despite persistent or recurrent social, interpersonal problems, caused or exacerbated by use,Evidence of tolerance,Large amounts of time spent to obtain, use or recover from the substance(s),Persistent desire or unsuccessful efforts to cut down or control use,Presence of craving or strong urge to use,Recurrent use that results in a failure to fulfill major role obligations (work, school, home),Social, occupational, recreational activities given up or reduced due to use  Recommendations for Services/Supports/Treatments: Recommendations for Services/Supports/Treatments Recommendations For Services/Supports/Treatments: CD-IOP Intensive Chemical Dependency Program  DSM5 Diagnoses: Patient Active Problem List   Diagnosis Date Noted  . Substance induced mood disorder (HCC)   . Severe recurrent major depression without psychotic features (HCC) 08/06/2019    Patient Centered Plan: Patient is on the following Treatment Plan(s):     Referrals to Alternative Service(s): Referred to Alternative Service(s):   Place:   Date:   Time:    Referred to Alternative Service(s):   Place:   Date:   Time:    Referred to Alternative Service(s):   Place:   Date:   Time:    Referred to Alternative Service(s):   Place:   Date:   Time:     Earline Mayotte, [facility]

## 2020-08-07 NOTE — ED Notes (Signed)
Pt sleeping at present, no distress noted, SI, HI noted, calm & cooperative.  Monitoring for for safety.

## 2020-08-07 NOTE — ED Provider Notes (Signed)
40 year old male history of alcohol abuse and suicidal ideation presented yesterday for evaluation in the morning.   Physical Exam  BP 102/69   Pulse 78   Temp 98.6 F (37 C) (Oral)   Resp 17   SpO2 97%   Physical Exam Well-developed well-nourished male sitting in the bed he does not appear to be in acute distress Blood pressure 120/74 heart rate 84 respiratory rate 17 sats 96%  ED Course/Procedures     Procedures  MDM     He was not seen by TTS until early morning hours today (approximately 12:30 AM).  At that time they stated that they could not evaluate because he was sleeping. 12:47 PM Since  prior entry, patient has been evaluated by TTS and advised to transfer to [facility]. Discussed with patient and he is in agreement with plan.  EMTALA form filled out with Dr. Bronwen Betters as excepted   Margarita Grizzle, MD 08/07/20 1248

## 2020-08-07 NOTE — ED Provider Notes (Signed)
Behavioral Health Admission H&P [facility] & OBS)  Date: 08/07/20 Patient Name: Ian Munoz MRN: [id number] Chief Complaint:  Chief Complaint  Patient presents with  . Urgent Emergent Eval      Diagnoses:  Final diagnoses:  Substance induced mood disorder (HCC)  Severe recurrent major depression without psychotic features (HCC)  Alcohol use disorder, severe, dependence (HCC)  Methamphetamine abuse (HCC)    HPI: Ian Munoz, 40 y.o., male patient presents to [facility] for direct admit to continuous assessment from [facility] ED with complaints of suicidal/homicidal ideation, alcohol and Meth abuse requesting detox and rehab.  Patient seen face to face by this provider, consulted with Dr. Earlene Plater; and chart reviewed on 08/07/20.  On evaluation Ian Munoz reports he came to the hospital because "1. I was thinking about hurting myself and other people and 2. To get help for my drug problem."  Patient states he is employed and lives with his sister and 2 other male adults.  Patient states he needs detox from alcohol and wants to go somewhere for detox and then rehab.  When asked about suicidal ideation patient stated "Not today."  Homicidal ideation "Not today."  Patient also denied auditory and visual hallucinations.  When asked what his stressors were patient state "What ain't."  Patient informed in order to help he has to let this writer know what he needed "I just want to detox and go to rehab."  Patient states he is currently taking Abilify and Lexapro but unable to tell who is writing the prescription other than his PCP.  States he started while at [facility] [employer] in 2021 and has been taking every since.    During evaluation Ian Munoz is alert/oriented x 4; calm/cooperative; and mood is congruent with affect.  He does not appear to be responding to internal/external stimuli or delusional thoughts.  Patient denies suicidal/self-harm/homicidal ideation, psychosis, and paranoia.   Patient answered question appropriately.    PHQ 2-9:  Flowsheet Row ED from 08/06/2020 in [facility] EMERGENCY DEPARTMENT  Thoughts that you would be better off dead, or of hurting yourself in some way More than half the days  PHQ-9 Total Score 12      Flowsheet Row ED from 08/07/2020 in [facility] ED from 08/06/2020 in [facility] EMERGENCY DEPARTMENT ED from 07/11/2020 in [facility]   C-SSRS RISK CATEGORY Error: Q7 should not be populated when Q6 is No High Risk No Risk       Total Time spent with patient: 45 minutes  Musculoskeletal  Strength & Muscle Tone: within normal limits Gait & Station: normal Patient leans: N/A  Psychiatric Specialty Exam  Presentation General Appearance: Appropriate for Environment; Casual  Eye Contact:Good  Speech:Clear and Coherent; Normal Rate  Speech Volume:Normal  Handedness:Right   Mood and Affect  Mood:Depressed  Affect:Congruent   Thought Process  Thought Processes:Coherent; Goal Directed  Descriptions of Associations:Intact  Orientation:Full (Time, Place and Person)  Thought Content:WDL  Diagnosis of Schizophrenia or Schizoaffective disorder in past: No   Hallucinations:Hallucinations: None  Ideas of Reference:None  Suicidal Thoughts:Suicidal Thoughts: No (Denies at this time)  Homicidal Thoughts:Homicidal Thoughts: No (Denies at this time)   Sensorium  Memory:Remote Good  Judgment:Intact  Insight:Present   Executive Functions  Concentration:Good  Attention Span:Good  Recall:Good  Fund of Knowledge:Good  Language:Good   Psychomotor Activity  Psychomotor Activity:Psychomotor Activity: Normal   Assets  Assets:Communication Skills; Desire  for Improvement; Financial Resources/Insurance; Housing; Resilience; Social Support   Sleep  Sleep:Sleep: Good   Nutritional Assessment (For OBS and FBC admissions  only) Has the patient had a weight loss or gain of 10 pounds or more in the last 3 months?: No Has the patient had a decrease in food intake/or appetite?: No Does the patient have dental problems?: No Does the patient have eating habits or behaviors that may be indicators of an eating disorder including binging or inducing vomiting?: No Has the patient recently lost weight without trying?: No Has the patient been eating poorly because of a decreased appetite?: No Malnutrition Screening Tool Score: 0    Physical Exam Vitals and nursing note reviewed. Exam conducted with a chaperone present.  Constitutional:      General: He is not in acute distress.    Appearance: Normal appearance. He is not ill-appearing.  HENT:     Head: Normocephalic.  Eyes:     Pupils: Pupils are equal, round, and reactive to light.  Cardiovascular:     Rate and Rhythm: Normal rate.  Pulmonary:     Effort: Pulmonary effort is normal.  Musculoskeletal:        General: Normal range of motion.     Cervical back: Normal range of motion.  Skin:    General: Skin is warm and dry.  Neurological:     General: No focal deficit present.     Mental Status: He is alert and oriented to person, place, and time.  Psychiatric:        Attention and Perception: Attention and perception normal. He does not perceive auditory or visual hallucinations.        Mood and Affect: Affect normal. Mood is depressed.        Speech: Speech normal.        Behavior: Behavior normal. Behavior is cooperative.        Thought Content: Thought content normal. Thought content is not paranoid or delusional. Thought content does not include homicidal or suicidal ideation.        Cognition and Memory: Cognition and memory normal.        Judgment: Judgment is impulsive.    Review of Systems  Constitutional: Negative.   HENT: Negative.   Eyes: Negative.   Respiratory: Negative.   Cardiovascular: Negative.   Gastrointestinal: Negative.    Genitourinary: Negative.   Musculoskeletal: Negative.   Skin: Negative.   Neurological: Negative.  Negative for tremors and seizures.  Endo/Heme/Allergies: Negative.   Psychiatric/Behavioral: Positive for depression and substance abuse (Alcohol and Meth). Negative for hallucinations and memory loss. Suicidal ideas: Denies at this time. The patient does not have insomnia. Nervous/anxious: Stable.     Blood pressure 119/77, pulse 83, temperature 98.8 F (37.1 C), temperature source Oral, resp. rate 18, SpO2 98 %. There is no height or weight on file to calculate BMI.  Past Psychiatric History: Alcohol use disorder, substance induced mood disorder, meth use disorder, MDD   Is the patient at risk to self? No  Has the patient been a risk to self in the past 6 months? No .    Has the patient been a risk to self within the distant past? No   Is the patient a risk to others? No   Has the patient been a risk to others in the past 6 months? No   Has the patient been a risk to others within the distant past? No   Past Medical History:  Past Medical  History:  Diagnosis Date  . Depression   . Prostate enlargement     Past Surgical History:  Procedure Laterality Date  . EYE SURGERY    . WRIST SURGERY Right     Family History:  Family History  Family history unknown: Yes    Social History:  Social History   Socioeconomic History  . Marital status: Single    Spouse name: Not on file  . Number of children: Not on file  . Years of education: Not on file  . Highest education level: Not on file  Occupational History  . Not on file  Tobacco Use  . Smoking status: Never Smoker  . Smokeless tobacco: Current User    Types: Chew  Substance and Sexual Activity  . Alcohol use: No  . Drug use: No  . Sexual activity: Yes    Birth control/protection: None  Other Topics Concern  . Not on file  Social History Narrative  . Not on file   Social Determinants of Health   Financial  Resource Strain: Not on file  Food Insecurity: Not on file  Transportation Needs: Not on file  Physical Activity: Not on file  Stress: Not on file  Social Connections: Not on file  Intimate Partner Violence: Not on file    SDOH:  SDOH Screenings   Alcohol Screen: Not on file  Depression (PHQ2-9): Medium Risk  . PHQ-2 Score: 12  Financial Resource Strain: Not on file  Food Insecurity: Not on file  Housing: Not on file  Physical Activity: Not on file  Social Connections: Not on file  Stress: Not on file  Tobacco Use: High Risk  . Smoking Tobacco Use: Never Smoker  . Smokeless Tobacco Use: Current User  Transportation Needs: Not on file    Last Labs:  Admission on 08/06/2020, Discharged on 08/07/2020  Component Date Value Ref Range Status  . Sodium 08/06/2020 136  135 - 145 mmol/L Final  . Potassium 08/06/2020 4.0  3.5 - 5.1 mmol/L Final  . Chloride 08/06/2020 103  98 - 111 mmol/L Final  . CO2 08/06/2020 25  22 - 32 mmol/L Final  . Glucose, Bld 08/06/2020 89  70 - 99 mg/dL Final   Glucose reference range applies only to samples taken after fasting for at least 8 hours.  . BUN 08/06/2020 16  6 - 20 mg/dL Final  . Creatinine, Ser 08/06/2020 0.87  0.61 - 1.24 mg/dL Final  . Calcium 60/73/7106 8.7* 8.9 - 10.3 mg/dL Final  . Total Protein 08/06/2020 6.8  6.5 - 8.1 g/dL Final  . Albumin 26/94/8546 3.8  3.5 - 5.0 g/dL Final  . AST 27/06/5007 138* 15 - 41 U/L Final  . ALT 08/06/2020 383* 0 - 44 U/L Final  . Alkaline Phosphatase 08/06/2020 108  38 - 126 U/L Final  . Total Bilirubin 08/06/2020 0.5  0.3 - 1.2 mg/dL Final  . GFR, Estimated 08/06/2020 >60  >60 mL/min Final   Comment: (NOTE) Calculated using the CKD-EPI Creatinine Equation (2021)   . Anion gap 08/06/2020 8  5 - 15 Final   Performed at [facility], [address], [place], [place] [address]  . WBC 08/06/2020 5.0  4.0 - 10.5 K/uL Final  . RBC 08/06/2020 4.25  4.22 - 5.81 MIL/uL Final  . Hemoglobin 08/06/2020  14.8  13.0 - 17.0 g/dL Final  . HCT 99/37/1696 44.4  39.0 - 52.0 % Final  . MCV 08/06/2020 104.5* 80.0 - 100.0 fL Final  .  MCH 08/06/2020 34.8* 26.0 - 34.0 pg Final  . MCHC 08/06/2020 33.3  30.0 - 36.0 g/dL Final  . RDW 16/01/9603 12.7  11.5 - 15.5 % Final  . Platelets 08/06/2020 222  150 - 400 K/uL Final  . nRBC 08/06/2020 0.0  0.0 - 0.2 % Final  . Neutrophils Relative % 08/06/2020 48  % Final  . Neutro Abs 08/06/2020 2.4  1.7 - 7.7 K/uL Final  . Lymphocytes Relative 08/06/2020 35  % Final  . Lymphs Abs 08/06/2020 1.8  0.7 - 4.0 K/uL Final  . Monocytes Relative 08/06/2020 10  % Final  . Monocytes Absolute 08/06/2020 0.5  0.1 - 1.0 K/uL Final  . Eosinophils Relative 08/06/2020 6  % Final  . Eosinophils Absolute 08/06/2020 0.3  0.0 - 0.5 K/uL Final  . Basophils Relative 08/06/2020 1  % Final  . Basophils Absolute 08/06/2020 0.0  0.0 - 0.1 K/uL Final  . Immature Granulocytes 08/06/2020 0  % Final  . Abs Immature Granulocytes 08/06/2020 0.02  0.00 - 0.07 K/uL Final   Performed at [facility], [address] [place], [place] [address]  . Alcohol, Ethyl (B) 08/06/2020 142* <10 mg/dL Final   Comment: (NOTE) Lowest detectable limit for serum alcohol is 10 mg/dL.  For medical purposes only. Performed at [facility], [address], [place], [place] [address]   . Salicylate Lvl 08/06/2020 <7.0* 7.0 - 30.0 mg/dL Final   Performed at [facility], [address] [place], [place] [address]  . Acetaminophen (Tylenol), Serum 08/06/2020 <10* 10 - 30 ug/mL Final   Comment: (NOTE) Therapeutic concentrations vary significantly. A range of 10-30 ug/mL  may be an effective concentration for many patients. However, some  are best treated at concentrations outside of this range. Acetaminophen concentrations >150 ug/mL at 4 hours after ingestion  and >50 ug/mL at 12 hours after ingestion are often associated with  toxic reactions.  Performed at [facility], [address] [place], [place] [address]   . Color, Urine 08/06/2020 STRAW* YELLOW Final  . APPearance 08/06/2020 CLEAR  CLEAR Final  . Specific Gravity, Urine 08/06/2020 1.009  1.005 - 1.030 Final  . pH 08/06/2020 5.0  5.0 - 8.0 Final  . Glucose, UA 08/06/2020 NEGATIVE  NEGATIVE mg/dL Final  . Hgb urine dipstick 08/06/2020 NEGATIVE  NEGATIVE Final  . Bilirubin Urine 08/06/2020 NEGATIVE  NEGATIVE Final  . Ketones, ur 08/06/2020 NEGATIVE  NEGATIVE mg/dL Final  . Protein, ur 86/57/8469 NEGATIVE  NEGATIVE mg/dL Final  . Nitrite 62/95/2841 NEGATIVE  NEGATIVE Final  . Glori Luis 08/06/2020 NEGATIVE  NEGATIVE Final   Performed at [facility], [address], [place], [place] [address]  . Opiates 08/06/2020 NONE DETECTED  NONE DETECTED Final  . Cocaine 08/06/2020 NONE DETECTED  NONE DETECTED Final  . Benzodiazepines 08/06/2020 NONE DETECTED  NONE DETECTED Final  . Amphetamines 08/06/2020 POSITIVE* NONE DETECTED Final  . Tetrahydrocannabinol 08/06/2020 NONE DETECTED  NONE DETECTED Final  . Barbiturates 08/06/2020 NONE DETECTED  NONE DETECTED Final   Comment: (NOTE) DRUG SCREEN FOR MEDICAL PURPOSES ONLY.  IF CONFIRMATION IS NEEDED FOR ANY PURPOSE, NOTIFY LAB WITHIN 5 DAYS.  LOWEST DETECTABLE LIMITS FOR URINE DRUG SCREEN Drug Class                     Cutoff (ng/mL) Amphetamine and metabolites    1000 Barbiturate and metabolites    200 Benzodiazepine  200 Tricyclics and metabolites     300 Opiates and metabolites        300 Cocaine and metabolites        300 THC                            50 Performed at [facility], [address], [place], [place] [address]   . Specimen Description 08/06/2020 URINE, RANDOM   Final  . Special Requests 08/06/2020 NONE   Final  . Culture 08/06/2020    Final                   Value:NO GROWTH Performed at [facility], [address], [place], [place] [address]   . Report Status 08/06/2020 08/07/2020 FINAL   Final  .  Hepatitis B Surface Ag 08/06/2020 NON REACTIVE  NON REACTIVE Final  . HCV Ab 08/06/2020 NON REACTIVE  NON REACTIVE Final   Comment: (NOTE) Nonreactive HCV antibody screen is consistent with no HCV infections,  unless recent infection is suspected or other evidence exists to indicate HCV infection.    . Hep A IgM 08/06/2020 NON REACTIVE  NON REACTIVE Final  . Hep B C IgM 08/06/2020 NON REACTIVE  NON REACTIVE Final   Performed at [facility], [address] [place], [place] [address]  . SARS Coronavirus 2 by RT PCR 08/06/2020 NEGATIVE  NEGATIVE Final   Comment: (NOTE) SARS-CoV-2 target nucleic acids are NOT DETECTED.  The SARS-CoV-2 RNA is generally detectable in upper respiratory specimens during the acute phase of infection. The lowest concentration of SARS-CoV-2 viral copies this assay can detect is 138 copies/mL. A negative result does not preclude SARS-Cov-2 infection and should not be used as the sole basis for treatment or other patient management decisions. A negative result may occur with  improper specimen collection/handling, submission of specimen other than nasopharyngeal swab, presence of viral mutation(s) within the areas targeted by this assay, and inadequate number of viral copies(<138 copies/mL). A negative result must be combined with clinical observations, patient history, and epidemiological information. The expected result is Negative.  Fact Sheet for Patients:  BloggerCourse.com  Fact Sheet for Healthcare Providers:  SeriousBroker.it  This test is no                          t yet approved or cleared by the [place] FDA and  has been authorized for detection and/or diagnosis of SARS-CoV-2 by FDA under an Emergency Use Authorization (EUA). This EUA will remain  in effect (meaning this test can be used) for the duration of the COVID-19 declaration under Section 564(b)(1) of the Act,  21 U.S.C.section 360bbb-3(b)(1), unless the authorization is terminated  or revoked sooner.      . Influenza A by PCR 08/06/2020 NEGATIVE  NEGATIVE Final  . Influenza B by PCR 08/06/2020 NEGATIVE  NEGATIVE Final   Comment: (NOTE) The Xpert Xpress SARS-CoV-2/FLU/RSV plus assay is intended as an aid in the diagnosis of influenza from Nasopharyngeal swab specimens and should not be used as a sole basis for treatment. Nasal washings and aspirates are unacceptable for Xpert Xpress SARS-CoV-2/FLU/RSV testing.  Fact Sheet for Patients: BloggerCourse.com  Fact Sheet for Healthcare Providers: SeriousBroker.it  This test is not yet approved or cleared by the [place] FDA and has been authorized for detection and/or diagnosis of SARS-CoV-2 by FDA under an Emergency  Use Authorization (EUA). This EUA will remain in effect (meaning this test can be used) for the duration of the COVID-19 declaration under Section 564(b)(1) of the Act, 21 U.S.C. section 360bbb-3(b)(1), unless the authorization is terminated or revoked.  Performed at [facility], [address] [place], [place] [address]   . TSH 08/07/2020 2.254  0.350 - 4.500 uIU/mL Final   Comment: Performed by a 3rd Generation assay with a functional sensitivity of <=0.01 uIU/mL. Performed at [facility], [address] [place], [place] [address]   Admission on 07/11/2020, Discharged on 07/11/2020  Component Date Value Ref Range Status  . Color, UA 07/11/2020 yellow  yellow Final  . Clarity, UA 07/11/2020 clear  clear Final  . Glucose, UA 07/11/2020 negative  negative mg/dL Final  . Bilirubin, UA 07/11/2020 negative  negative Final  . Ketones, POC UA 07/11/2020 negative  negative mg/dL Final  . Spec Grav, UA 07/11/2020 1.025  1.010 - 1.025 Final  . Blood, UA 07/11/2020 trace-intact* negative Final  . pH, UA 07/11/2020 6.0  5.0 - 8.0 Final  . Protein Ur, POC  07/11/2020 negative  negative mg/dL Final  . Urobilinogen, UA 07/11/2020 0.2  0.2 or 1.0 E.U./dL Final  . Nitrite, UA 09/81/1914 Negative  Negative Final  . Leukocytes, UA 07/11/2020 Negative  Negative Final  . Specimen Description 07/11/2020 URINE, CLEAN CATCH   Final  . Special Requests 07/11/2020 NONE   Final  . Culture 07/11/2020 *  Final                   Value:<10,000 COLONIES/mL INSIGNIFICANT GROWTH Performed at [facility], [address] [place], [place] [address]   . Report Status 07/11/2020 07/13/2020 FINAL   Final  Admission on 05/01/2020, Discharged on 05/01/2020  Component Date Value Ref Range Status  . SARS Coronavirus 2 05/01/2020 POSITIVE* NEGATIVE Final   Comment: (NOTE) SARS-CoV-2 target nucleic acids are DETECTED.  The SARS-CoV-2 RNA is generally detectable in upper and lower respiratory specimens during the acute phase of infection. Positive results are indicative of the presence of SARS-CoV-2 RNA. Clinical correlation with patient history and other diagnostic information is  necessary to determine patient infection status. Positive results do not rule out bacterial infection or co-infection with other viruses.  The expected result is Negative.  Fact Sheet for Patients: HairSlick.no  Fact Sheet for Healthcare Providers: quierodirigir.com  This test is not yet approved or cleared by the [place] FDA and  has been authorized for detection and/or diagnosis of SARS-CoV-2 by FDA under an Emergency Use Authorization (EUA). This EUA will remain  in effect (meaning this test can be used) for the duration of the COVID-19 declaration under Section 564(b)(1) of the Act, 21 U.                          S.C. section 360bbb-3(b)(1), unless the authorization is terminated or revoked sooner.   Performed at [facility], [address] [place], [place] [address]   Admission on 04/10/2020,  Discharged on 04/11/2020  Component Date Value Ref Range Status  . Sodium 04/10/2020 142  135 - 145 mmol/L Final  . Potassium 04/10/2020 3.9  3.5 - 5.1 mmol/L Final  . Chloride 04/10/2020 106  98 - 111 mmol/L Final  . CO2 04/10/2020 25  22 - 32 mmol/L Final  . Glucose, Bld 04/10/2020 111* 70 - 99 mg/dL Final   Glucose  reference range applies only to samples taken after fasting for at least 8 hours.  . BUN 04/10/2020 5* 6 - 20 mg/dL Final  . Creatinine, Ser 04/10/2020 0.79  0.61 - 1.24 mg/dL Final  . Calcium 16/10/960412/22/2021 9.1  8.9 - 10.3 mg/dL Final  . GFR, Estimated 04/10/2020 >60  >60 mL/min Final   Comment: (NOTE) Calculated using the CKD-EPI Creatinine Equation (2021)   . Anion gap 04/10/2020 11  5 - 15 Final   Performed at [facility], [address] [place], [place] [address]  . WBC 04/10/2020 7.0  4.0 - 10.5 K/uL Final  . RBC 04/10/2020 4.51  4.22 - 5.81 MIL/uL Final  . Hemoglobin 04/10/2020 15.9  13.0 - 17.0 g/dL Final  . HCT 11/91/478212/22/2021 45.2  39.0 - 52.0 % Final  . MCV 04/10/2020 100.2* 80.0 - 100.0 fL Final  . MCH 04/10/2020 35.3* 26.0 - 34.0 pg Final  . MCHC 04/10/2020 35.2  30.0 - 36.0 g/dL Final  . RDW 95/62/130812/22/2021 12.9  11.5 - 15.5 % Final  . Platelets 04/10/2020 315  150 - 400 K/uL Final  . nRBC 04/10/2020 0.0  0.0 - 0.2 % Final   Performed at [facility], [address] [place], [place] [address]  . Troponin I (High Sensitivity) 04/10/2020 2  <18 ng/L Final   Comment: (NOTE) Elevated high sensitivity troponin I (hsTnI) values and significant  changes across serial measurements may suggest ACS but many other  chronic and acute conditions are known to elevate hsTnI results.  Refer to the "Links" section for chest pain algorithms and additional  guidance. Performed at [facility], [address] [place], [place] [address]   . Troponin I (High Sensitivity) 04/11/2020 3  <18 ng/L Final   Comment: (NOTE) Elevated high sensitivity troponin I (hsTnI)  values and significant  changes across serial measurements may suggest ACS but many other  chronic and acute conditions are known to elevate hsTnI results.  Refer to the "Links" section for chest pain algorithms and additional  guidance. Performed at [facility], [address] [place], [place] [address]   Admission on 03/13/2020, Discharged on 03/13/2020  Component Date Value Ref Range Status  . Color, Urine 03/13/2020 YELLOW  YELLOW Final  . APPearance 03/13/2020 CLEAR  CLEAR Final  . Specific Gravity, Urine 03/13/2020 1.017  1.005 - 1.030 Final  . pH 03/13/2020 5.0  5.0 - 8.0 Final  . Glucose, UA 03/13/2020 NEGATIVE  NEGATIVE mg/dL Final  . Hgb urine dipstick 03/13/2020 NEGATIVE  NEGATIVE Final  . Bilirubin Urine 03/13/2020 NEGATIVE  NEGATIVE Final  . Ketones, ur 03/13/2020 NEGATIVE  NEGATIVE mg/dL Final  . Protein, ur 32/44/010211/24/2021 NEGATIVE  NEGATIVE mg/dL Final  . Nitrite 72/53/664411/24/2021 NEGATIVE  NEGATIVE Final  . Glori LuisLeukocytes,Ua 03/13/2020 NEGATIVE  NEGATIVE Final   Performed at [facility], [address] [place], [place] [address]  . Sodium 03/13/2020 137  135 - 145 mmol/L Final  . Potassium 03/13/2020 3.6  3.5 - 5.1 mmol/L Final  . Chloride 03/13/2020 100  98 - 111 mmol/L Final  . CO2 03/13/2020 28  22 - 32 mmol/L Final  . Glucose, Bld 03/13/2020 95  70 - 99 mg/dL Final   Glucose reference range applies only to samples taken after fasting for at least 8 hours.  . BUN 03/13/2020 9  6 - 20 mg/dL Final  . Creatinine, Ser 03/13/2020 0.90  0.61 - 1.24 mg/dL Final  .  Calcium 03/13/2020 9.6  8.9 - 10.3 mg/dL Final  . GFR, Estimated 03/13/2020 >60  >60 mL/min Final   Comment: (NOTE) Calculated using the CKD-EPI Creatinine Equation (2021)   . Anion gap 03/13/2020 9  5 - 15 Final   Performed at [facility], [address], [place], [place] [address]  . WBC 03/13/2020 9.1  4.0 - 10.5 K/uL Final  . RBC 03/13/2020 4.55  4.22 - 5.81 MIL/uL Final  . Hemoglobin  03/13/2020 15.2  13.0 - 17.0 g/dL Final  . HCT 25/36/6440 47.2  39.0 - 52.0 % Final  . MCV 03/13/2020 103.7* 80.0 - 100.0 fL Final  . MCH 03/13/2020 33.4  26.0 - 34.0 pg Final  . MCHC 03/13/2020 32.2  30.0 - 36.0 g/dL Final  . RDW 34/74/2595 12.4  11.5 - 15.5 % Final  . Platelets 03/13/2020 299  150 - 400 K/uL Final  . nRBC 03/13/2020 0.0  0.0 - 0.2 % Final   Performed at [facility], [address], [place], [place] [id number]    Allergies: Patient has no known allergies.  PTA Medications: (Not in a hospital admission)   Medical Decision Making  Patient admitted to Continuous Assessment Unit Labs Reviewed:  See above Medication management: Meds ordered this encounter  Medications  . acetaminophen (TYLENOL) tablet 650 mg  . alum & mag hydroxide-simeth (MAALOX/MYLANTA) 200-200-20 MG/5ML suspension 30 mL  . magnesium hydroxide (MILK OF MAGNESIA) suspension 30 mL  . hydrOXYzine (ATARAX/VISTARIL) tablet 25 mg  . traZODone (DESYREL) tablet 50 mg  . ARIPiprazole (ABILIFY) tablet 5 mg  . escitalopram (LEXAPRO) tablet 20 mg  . thiamine tablet 100 mg  . multivitamin with minerals tablet 1 tablet  . LORazepam (ATIVAN) tablet 1 mg  . hydrOXYzine (ATARAX/VISTARIL) tablet 25 mg  . loperamide (IMODIUM) capsule 2-4 mg  . ondansetron (ZOFRAN-ODT) disintegrating tablet 4 mg  acetaminophen, alum & mag hydroxide-simeth, hydrOXYzine, hydrOXYzine, loperamide, LORazepam, magnesium hydroxide, ondansetron, traZODone    Recommendations  Based on my evaluation the patient does not appear to have an emergency medical condition.  Koben Daman, NP 08/07/20  4:20 PM

## 2020-08-07 NOTE — ED Notes (Signed)
Pt appears to be sleeping. V/S WNL. Sitter present.

## 2020-08-07 NOTE — BH Assessment (Signed)
Per [place], RN pt is sleeping. RN to call once pt is alert and is able to engage.    Redmond Pulling, MS, [facility], [facility] Triage Specialist [phone]

## 2020-08-07 NOTE — Discharge Instructions (Addendum)

## 2020-08-07 NOTE — ED Notes (Signed)
Pt sleeping@thsi time. Breathing even and unlabored. Will continue to monitor for safety 

## 2020-08-07 NOTE — ED Notes (Signed)
Pt transfer from [facility] as direct admit to continuous assessment due to endorsing SI and ETOH abuse. On admission, pt continue to endorse passive SI and reports drinking heavy for past few weeks. Pt request rehab for ETOH abuse. Cooperative with staff. Denies HI/AVH. Will monitor for safety.

## 2020-08-08 DIAGNOSIS — F1510 Other stimulant abuse, uncomplicated: Secondary | ICD-10-CM | POA: Diagnosis not present

## 2020-08-08 DIAGNOSIS — F1994 Other psychoactive substance use, unspecified with psychoactive substance-induced mood disorder: Secondary | ICD-10-CM | POA: Diagnosis not present

## 2020-08-08 DIAGNOSIS — F332 Major depressive disorder, recurrent severe without psychotic features: Secondary | ICD-10-CM | POA: Diagnosis not present

## 2020-08-08 DIAGNOSIS — F1020 Alcohol dependence, uncomplicated: Secondary | ICD-10-CM | POA: Diagnosis not present

## 2020-08-08 MED ORDER — ESCITALOPRAM OXALATE 10 MG PO TABS: 20.0000 mg | ORAL_TABLET | Freq: Every day | ORAL | 0 refills | Status: DC

## 2020-08-08 MED ORDER — ARIPIPRAZOLE 5 MG PO TABS
5.0000 mg | ORAL_TABLET | Freq: Every day | ORAL | Status: DC
  Administered 2020-08-08: 5 mg via ORAL
  Filled 2020-08-08: qty 1

## 2020-08-08 MED ORDER — ARIPIPRAZOLE 5 MG PO TABS: 5.0000 mg | ORAL_TABLET | Freq: Every day | ORAL | 0 refills | Status: DC

## 2020-08-08 MED ORDER — LEVOTHYROXINE SODIUM 25 MCG PO TABS: 25.0000 ug | ORAL_TABLET | Freq: Every day | ORAL | 0 refills | Status: DC

## 2020-08-08 MED ORDER — TRAZODONE HCL 50 MG PO TABS: 50.0000 mg | ORAL_TABLET | Freq: Every evening | ORAL | 0 refills | Status: DC | PRN

## 2020-08-08 MED ORDER — LEVOTHYROXINE SODIUM 25 MCG PO TABS
25.0000 ug | ORAL_TABLET | Freq: Every day | ORAL | Status: DC
  Administered 2020-08-08: 25 ug via ORAL
  Filled 2020-08-08: qty 1

## 2020-08-08 MED ORDER — TRAZODONE HCL 100 MG PO TABS: 100.0000 mg | ORAL_TABLET | Freq: Every evening | ORAL | 0 refills | Status: DC | PRN

## 2020-08-08 MED ORDER — ESCITALOPRAM OXALATE 10 MG PO TABS
20.0000 mg | ORAL_TABLET | Freq: Every day | ORAL | Status: DC
  Administered 2020-08-08: 20 mg via ORAL
  Filled 2020-08-08: qty 2

## 2020-08-08 NOTE — Progress Notes (Signed)
Patient is alert and oriented X 4, denies SI, HI and AVH. CIWA of 0 this morning. Patient looking for detox programs. Appropriate to staff and peers on the unit. Nursing staff will continue to  Monitor.

## 2020-08-08 NOTE — Progress Notes (Signed)
CSW checked disposition phone and a voicemail had been left at [facility] in [place].  Eber Jones, reported that they do not accept [employer] and that "meth would have to be out of his system" before he could be admitted.  She later reiterated that [facility] could not accept pt due to his insurance.   Penni Homans, MSW, LCSW 08/08/2020 8:23 AM

## 2020-08-08 NOTE — Progress Notes (Signed)
CSW spoke with patient about potential substance use treatment options. Patient reports that he is feeling better than when he first came in. When asked to clarify, patient reported feeling clearer and just needing time to detox.  Patient reported not being interested in residential services for detox.   SW informed patient that they are unable to keep patient for ongoing detox and he would be more than likely would be discharged today. Patient reported interest in some detox faciliities including [facility], [address] and [facility] in [place]. Patient signed consents to send patient information to these facilities.  Patient reported that he would need to call his roommate to get him to detox facilities and also said that he would need to call his employer, [employer] on [address] to notify them why he may not be at work if he went to detox.  Patient agreed to call both employer and roommate about impending discharge and agreed for social worker to fax information to detox facilities.     Brodric Schauer, LCSW, LCAS Clincal Social Worker  [facility]

## 2020-08-08 NOTE — ED Provider Notes (Signed)
FBC/OBS ASAP Discharge Summary  Date and Time: 08/08/2020 11:17 AM  Name: Ian Munoz  MRN:  [id number]   Discharge Diagnoses:  Final diagnoses:  Substance induced mood disorder (HCC)  Severe recurrent major depression without psychotic features (HCC)  Alcohol use disorder, severe, dependence (HCC)  Methamphetamine abuse (HCC)    Subjective: Patient reports today that he is doing okay and still feels like he is having some withdrawal symptoms.  Patient denies any suicidal or homicidal ideations and denies any hallucinations.  Patient reports he is willing to go to detox or possibly substance abuse treatment.  Patient is informed of some other options however patient is reporting that he wants to wait until after the weekend because he has to work this weekend and does not want to interfere with his job.  Patient is informed that the detox facilities are normally 5 to 7 days in a residential treatment is usually 14 to 28 days.  Patient then reports that he would possibly prefer to follow-up with his company because they offer a substance abuse treatment program through work which will help him with maintaining his employment.  Patient states that he has a roommate and that he plans to return home with him.  Patient does request to have his medications prescribed that he can continue them at home.  Stay Summary: Patient is a 40 year old male admitted to the [facility] as a [facility] with complaints of suicidal and homicidal ideations, alcohol and meth abuse and requesting detox and rehab.  Once patient arrived he reported that he was thinking about harming himself however he does not have those thoughts now.  He states that he is willing to get into substance abuse treatment to get the help that he needs.  Patient was admitted to the continuous observation unit for overnight assessment and was encouraged to start contacting detox and rehabilitation facilities for substance abuse.   Today the patient was provided with multiple options for substance abuse treatment however patient is now stating that he has to work this weekend and does not want it to interfere with his job.  Patient has denied any suicidal or homicidal ideations and denied any hallucinations.  Patient states that he is now going to contact his employer to see if they can assist him with a substance abuse program which would assist him with keeping employed.  Patient is provided with prescriptions for his home medications of Abilify 5 mg p.o. daily, Lexapro 20 mg p.o. daily, and trazodone 100 mg p.o. nightly.  Patient at first had reported that his roommate would pick him up however he then stated that his roommate was at work and he needed transportation home.  Patient was provided with safe transport home.  Patient was also provided with open access hours of operation at [facility] for outpatient follow-up.  Total Time spent with patient: 30 minutes  Past Psychiatric History: MDD, polysubstance abuse Past Medical History:  Past Medical History:  Diagnosis Date  . Depression   . Prostate enlargement     Past Surgical History:  Procedure Laterality Date  . EYE SURGERY    . WRIST SURGERY Right    Family History:  Family History  Family history unknown: Yes   Family Psychiatric History: None reported Social History:  Social History   Substance and Sexual Activity  Alcohol Use No     Social History   Substance and Sexual Activity  Drug Use No  Social History   Socioeconomic History  . Marital status: Single    Spouse name: Not on file  . Number of children: Not on file  . Years of education: Not on file  . Highest education level: Not on file  Occupational History  . Not on file  Tobacco Use  . Smoking status: Never Smoker  . Smokeless tobacco: Current User    Types: Chew  Substance and Sexual Activity  . Alcohol use: No  . Drug use: No  . Sexual activity: Yes    Birth  control/protection: None  Other Topics Concern  . Not on file  Social History Narrative  . Not on file   Social Determinants of Health   Financial Resource Strain: Not on file  Food Insecurity: Not on file  Transportation Needs: Not on file  Physical Activity: Not on file  Stress: Not on file  Social Connections: Not on file   SDOH:  SDOH Screenings   Alcohol Screen: Not on file  Depression (PHQ2-9): Medium Risk  . PHQ-2 Score: 12  Financial Resource Strain: Not on file  Food Insecurity: Not on file  Housing: Not on file  Physical Activity: Not on file  Social Connections: Not on file  Stress: Not on file  Tobacco Use: High Risk  . Smoking Tobacco Use: Never Smoker  . Smokeless Tobacco Use: Current User  Transportation Needs: Not on file    Has this patient used any form of tobacco in the last 30 days? (Cigarettes, Smokeless Tobacco, Cigars, and/or Pipes) A prescription for an FDA-approved tobacco cessation medication was offered at discharge and the patient refused  Current Medications:  Current Facility-Administered Medications  Medication Dose Route Frequency Provider Last Rate Last Admin  . acetaminophen (TYLENOL) tablet 650 mg  650 mg Oral Q6H PRN Jaela Yepez, Gerlene Burdock, FNP   650 mg at 08/08/20 0145  . alum & mag hydroxide-simeth (MAALOX/MYLANTA) 200-200-20 MG/5ML suspension 30 mL  30 mL Oral Q4H PRN Shakerria Parran, Gerlene Burdock, FNP      . ARIPiprazole (ABILIFY) tablet 5 mg  5 mg Oral Daily Chistina Roston, Gerlene Burdock, FNP   5 mg at 08/08/20 1012  . escitalopram (LEXAPRO) tablet 20 mg  20 mg Oral Daily Ayiana Winslett, Feliz Beam B, FNP   20 mg at 08/08/20 1006  . hydrOXYzine (ATARAX/VISTARIL) tablet 25 mg  25 mg Oral Q6H PRN Rankin, Shuvon B, NP      . levothyroxine (SYNTHROID) tablet 25 mcg  25 mcg Oral Q0600 Estella Husk, MD   25 mcg at 08/08/20 1007  . loperamide (IMODIUM) capsule 2-4 mg  2-4 mg Oral PRN Rankin, Shuvon B, NP      . LORazepam (ATIVAN) tablet 1 mg  1 mg Oral Q6H PRN Rankin, Shuvon B,  NP      . magnesium hydroxide (MILK OF MAGNESIA) suspension 30 mL  30 mL Oral Daily PRN Nguyet Mercer, Feliz Beam B, FNP      . multivitamin with minerals tablet 1 tablet  1 tablet Oral Daily Rankin, Shuvon B, NP   1 tablet at 08/08/20 1007  . ondansetron (ZOFRAN-ODT) disintegrating tablet 4 mg  4 mg Oral Q6H PRN Rankin, Shuvon B, NP      . thiamine tablet 100 mg  100 mg Oral Daily Rankin, Shuvon B, NP   100 mg at 08/08/20 1007  . traZODone (DESYREL) tablet 50 mg  50 mg Oral QHS PRN Stasha Naraine, Gerlene Burdock, FNP       Current Outpatient Medications  Medication Sig  Dispense Refill  . [START ON 08/09/2020] ARIPiprazole (ABILIFY) 5 MG tablet Take 1 tablet (5 mg total) by mouth daily. 30 tablet 0  . [START ON 08/09/2020] escitalopram (LEXAPRO) 10 MG tablet Take 2 tablets (20 mg total) by mouth daily. 30 tablet 0  . levothyroxine (SYNTHROID) 25 MCG tablet Take 1 tablet (25 mcg total) by mouth daily before breakfast. 30 tablet 0  . traZODone (DESYREL) 50 MG tablet Take 1 tablet (50 mg total) by mouth at bedtime as needed for sleep. 30 tablet 0    PTA Medications: (Not in a hospital admission)   Musculoskeletal  Strength & Muscle Tone: within normal limits Gait & Station: normal Patient leans: N/A  Psychiatric Specialty Exam  Presentation  General Appearance: Appropriate for Environment; Casual  Eye Contact:Good  Speech:Clear and Coherent; Normal Rate  Speech Volume:Normal  Handedness:Right   Mood and Affect  Mood:Anxious  Affect:Appropriate; Congruent   Thought Process  Thought Processes:Coherent  Descriptions of Associations:Intact  Orientation:Full (Time, Place and Person)  Thought Content:WDL  Diagnosis of Schizophrenia or Schizoaffective disorder in past: No    Hallucinations:Hallucinations: None  Ideas of Reference:None  Suicidal Thoughts:Suicidal Thoughts: No  Homicidal Thoughts:Homicidal Thoughts: No   Sensorium  Memory:Immediate Good; Recent Good; Remote  Good  Judgment:Intact  Insight:Good   Executive Functions  Concentration:Good  Attention Span:Good  Recall:Good  Fund of Knowledge:Good  Language:Good   Psychomotor Activity  Psychomotor Activity:Psychomotor Activity: Normal   Assets  Assets:Communication Skills; Desire for Improvement; Financial Resources/Insurance; Housing; Physical Health; Resilience; Social Support; Transportation   Sleep  Sleep:Sleep: Good   Nutritional Assessment (For OBS and FBC admissions only) Has the patient had a weight loss or gain of 10 pounds or more in the last 3 months?: No Has the patient had a decrease in food intake/or appetite?: No Does the patient have dental problems?: No Does the patient have eating habits or behaviors that may be indicators of an eating disorder including binging or inducing vomiting?: No Has the patient recently lost weight without trying?: No Has the patient been eating poorly because of a decreased appetite?: No Malnutrition Screening Tool Score: 0    Physical Exam  Physical Exam Vitals and nursing note reviewed.  Constitutional:      Appearance: He is well-developed.  HENT:     Head: Normocephalic.  Eyes:     Pupils: Pupils are equal, round, and reactive to light.  Cardiovascular:     Rate and Rhythm: Normal rate.  Pulmonary:     Effort: Pulmonary effort is normal.  Musculoskeletal:        General: Normal range of motion.  Neurological:     Mental Status: He is alert and oriented to person, place, and time.    Review of Systems  Constitutional: Negative.   HENT: Negative.   Eyes: Negative.   Respiratory: Negative.   Cardiovascular: Negative.   Gastrointestinal: Negative.   Genitourinary: Negative.   Musculoskeletal: Negative.   Skin: Negative.   Neurological: Negative.   Endo/Heme/Allergies: Negative.   Psychiatric/Behavioral: Positive for substance abuse.   Blood pressure 109/80, pulse 97, temperature 98.1 F (36.7 C),  temperature source Oral, resp. rate 18, SpO2 100 %. There is no height or weight on file to calculate BMI.  Demographic Factors:  Male and Caucasian  Loss Factors: NA  Historical Factors: NA  Risk Reduction Factors:   Employed, Living with another person, especially a relative, Positive social support, Positive therapeutic relationship and Positive coping skills or problem solving skills  Continued Clinical Symptoms:  Alcohol/Substance Abuse/Dependencies Previous Psychiatric Diagnoses and Treatments  Cognitive Features That Contribute To Risk:  None    Suicide Risk:  Mild:  Suicidal ideation of limited frequency, intensity, duration, and specificity.  There are no identifiable plans, no associated intent, mild dysphoria and related symptoms, good self-control (both objective and subjective assessment), few other risk factors, and identifiable protective factors, including available and accessible social support.  Plan Of Care/Follow-up recommendations:  Continue activity as tolerated. Continue diet as recommended by your PCP. Ensure to keep all appointments with outpatient providers.  Disposition: Discharge home  Maryfrances Bunnell, FNP 08/08/2020, 11:17 AM

## 2020-08-08 NOTE — Progress Notes (Signed)
Ian Munoz to be D/C'd Home per MD order.  Discussed with the patient and all questions fully answered.  VSS, Skin clean, dry and intact without evidence of skin break down, no evidence of skin tears noted.   An After Visit Summary was printed and given to the patient. Patient received paper prescriptions.  D/c education completed with patient including follow up instructions, medication list, d/c activities limitations if indicated, with other d/c instructions as indicated by MD - patient able to verbalize understanding, all questions fully answered.    D/C home via Safety Transport  Ian Munoz 08/08/2020 11:37 AM

## 2020-08-08 NOTE — ED Notes (Signed)
Pt amb to bathroom. A& O x 4, no distress noted.  Monitoring for safety.

## 2020-08-13 ENCOUNTER — Ambulatory Visit (HOSPITAL_COMMUNITY)
Admission: EM | Admit: 2020-08-13 | Discharge: 2020-08-13 | Disposition: A | Discharge status: Home / Self Care | Payer: 59 | Attending: Psychiatry | Admitting: Psychiatry

## 2020-08-13 DIAGNOSIS — F1010 Alcohol abuse, uncomplicated: Secondary | ICD-10-CM

## 2020-08-13 DIAGNOSIS — F1994 Other psychoactive substance use, unspecified with psychoactive substance-induced mood disorder: Secondary | ICD-10-CM | POA: Diagnosis not present

## 2020-08-13 MED ORDER — ESCITALOPRAM OXALATE 10 MG PO TABS
20.0000 mg | ORAL_TABLET | Freq: Every day | ORAL | Status: DC
  Filled 2020-08-13 (×2): qty 2

## 2020-08-13 MED ORDER — ESCITALOPRAM OXALATE 10 MG PO TABS: 20.0000 mg | ORAL_TABLET | Freq: Every day | ORAL | 0 refills | Status: DC

## 2020-08-13 MED ORDER — TRAZODONE HCL 100 MG PO TABS: 100.0000 mg | ORAL_TABLET | Freq: Every evening | ORAL | 0 refills | Status: DC | PRN

## 2020-08-13 MED ORDER — LEVOTHYROXINE SODIUM 25 MCG PO TABS
25.0000 ug | ORAL_TABLET | Freq: Every day | ORAL | Status: DC
  Filled 2020-08-13: qty 7

## 2020-08-13 MED ORDER — ARIPIPRAZOLE 5 MG PO TABS: 5.0000 mg | ORAL_TABLET | Freq: Every day | ORAL | 0 refills | Status: DC

## 2020-08-13 MED ORDER — ARIPIPRAZOLE 5 MG PO TABS
5.0000 mg | ORAL_TABLET | Freq: Every day | ORAL | Status: DC
  Filled 2020-08-13: qty 7

## 2020-08-13 MED ORDER — TRAZODONE HCL 100 MG PO TABS
100.0000 mg | ORAL_TABLET | Freq: Every evening | ORAL | Status: DC | PRN
  Filled 2020-08-13 (×2): qty 7

## 2020-08-13 MED ORDER — LEVOTHYROXINE SODIUM 25 MCG PO TABS: 25.0000 ug | ORAL_TABLET | Freq: Every day | ORAL | 0 refills | Status: DC
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
## 2020-08-13 NOTE — ED Notes (Signed)
Safe transport called to transport pt to [facility]
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
## 2020-08-13 NOTE — ED Notes (Signed)
Patient discharged with After summary visit  form, and sample medications. Patient transported via safe transport. Patient to follow up with [facility] in [place].

## 2020-08-13 NOTE — Discharge Instructions (Signed)

## 2020-08-21 ENCOUNTER — Encounter (HOSPITAL_COMMUNITY): Payer: Self-pay

## 2020-08-21 ENCOUNTER — Emergency Department (HOSPITAL_COMMUNITY)
Admission: EM | Admit: 2020-08-21 | Discharge: 2020-08-22 | Disposition: A | Discharge status: Home / Self Care | Payer: 59 | Attending: Emergency Medicine | Admitting: Emergency Medicine

## 2020-08-21 ENCOUNTER — Other Ambulatory Visit: Payer: Self-pay

## 2020-08-21 DIAGNOSIS — Z20822 Contact with and (suspected) exposure to covid-19: Secondary | ICD-10-CM | POA: Insufficient documentation

## 2020-08-21 DIAGNOSIS — Z23 Encounter for immunization: Secondary | ICD-10-CM | POA: Insufficient documentation

## 2020-08-21 DIAGNOSIS — R45851 Suicidal ideations: Secondary | ICD-10-CM | POA: Diagnosis not present

## 2020-08-21 DIAGNOSIS — X781XXA Intentional self-harm by knife, initial encounter: Secondary | ICD-10-CM | POA: Diagnosis not present

## 2020-08-21 DIAGNOSIS — F332 Major depressive disorder, recurrent severe without psychotic features: Secondary | ICD-10-CM | POA: Insufficient documentation

## 2020-08-21 DIAGNOSIS — S59911A Unspecified injury of right forearm, initial encounter: Secondary | ICD-10-CM | POA: Diagnosis present

## 2020-08-21 DIAGNOSIS — F419 Anxiety disorder, unspecified: Secondary | ICD-10-CM | POA: Insufficient documentation

## 2020-08-21 DIAGNOSIS — S50811A Abrasion of right forearm, initial encounter: Secondary | ICD-10-CM | POA: Insufficient documentation

## 2020-08-21 DIAGNOSIS — F1924 Other psychoactive substance dependence with psychoactive substance-induced mood disorder: Secondary | ICD-10-CM | POA: Diagnosis not present

## 2020-08-21 LAB — RAPID URINE DRUG SCREEN, HOSP PERFORMED
Amphetamines: NOT DETECTED
Barbiturates: NOT DETECTED
Benzodiazepines: POSITIVE — AB
Cocaine: NOT DETECTED
Opiates: NOT DETECTED
Tetrahydrocannabinol: NOT DETECTED

## 2020-08-21 LAB — CBC WITH DIFFERENTIAL/PLATELET
Abs Immature Granulocytes: 0.02 10*3/uL (ref 0.00–0.07)
Basophils Absolute: 0.1 10*3/uL (ref 0.0–0.1)
Basophils Relative: 1 %
Eosinophils Absolute: 0.2 10*3/uL (ref 0.0–0.5)
Eosinophils Relative: 2 %
HCT: 42.6 % (ref 39.0–52.0)
Hemoglobin: 14.3 g/dL (ref 13.0–17.0)
Immature Granulocytes: 0 %
Lymphocytes Relative: 34 %
Lymphs Abs: 2.9 10*3/uL (ref 0.7–4.0)
MCH: 34.5 pg — ABNORMAL HIGH (ref 26.0–34.0)
MCHC: 33.6 g/dL (ref 30.0–36.0)
MCV: 102.9 fL — ABNORMAL HIGH (ref 80.0–100.0)
Monocytes Absolute: 0.6 10*3/uL (ref 0.1–1.0)
Monocytes Relative: 7 %
Neutro Abs: 4.8 10*3/uL (ref 1.7–7.7)
Neutrophils Relative %: 56 %
Platelets: 281 10*3/uL (ref 150–400)
RBC: 4.14 MIL/uL — ABNORMAL LOW (ref 4.22–5.81)
RDW: 13.2 % (ref 11.5–15.5)
WBC: 8.6 10*3/uL (ref 4.0–10.5)
nRBC: 0 % (ref 0.0–0.2)

## 2020-08-21 LAB — COMPREHENSIVE METABOLIC PANEL
ALT: 257 U/L — ABNORMAL HIGH (ref 0–44)
AST: 71 U/L — ABNORMAL HIGH (ref 15–41)
Albumin: 4.4 g/dL (ref 3.5–5.0)
Alkaline Phosphatase: 97 U/L (ref 38–126)
Anion gap: 12 (ref 5–15)
BUN: 11 mg/dL (ref 6–20)
CO2: 21 mmol/L — ABNORMAL LOW (ref 22–32)
Calcium: 8.8 mg/dL — ABNORMAL LOW (ref 8.9–10.3)
Chloride: 103 mmol/L (ref 98–111)
Creatinine, Ser: 0.82 mg/dL (ref 0.61–1.24)
GFR, Estimated: >60 mL/min (ref >60)
Glucose, Bld: 94 mg/dL (ref 70–99)
Potassium: 3.6 mmol/L (ref 3.5–5.1)
Sodium: 136 mmol/L (ref 135–145)
Total Bilirubin: 0.5 mg/dL (ref 0.3–1.2)
Total Protein: 7.8 g/dL (ref 6.5–8.1)

## 2020-08-21 LAB — ETHANOL: Alcohol, Ethyl (B): 263 mg/dL — ABNORMAL HIGH (ref <10)

## 2020-08-21 LAB — ACETAMINOPHEN LEVEL: Acetaminophen (Tylenol), Serum: <10 ug/mL — ABNORMAL LOW (ref 10–30)

## 2020-08-21 LAB — SALICYLATE LEVEL: Salicylate Lvl: <7 mg/dL — ABNORMAL LOW (ref 7.0–30.0)

## 2020-08-21 MED ORDER — TETANUS-DIPHTH-ACELL PERTUSSIS 5-2.5-18.5 LF-MCG/0.5 IM SUSY
0.5000 mL | PREFILLED_SYRINGE | Freq: Once | INTRAMUSCULAR | Status: AC
  Administered 2020-08-21: 0.5 mL via INTRAMUSCULAR
  Filled 2020-08-21: qty 0.5

## 2020-08-21 NOTE — BH Assessment (Signed)
Clinician sent pt's nurses a message via internal secure chat at [phone] requesting they inform clinician of when pt is moved to a room so his MH Assessment can be completed.

## 2020-08-21 NOTE — ED Notes (Signed)
Sitter at bedside obtaining vitals now.
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
## 2020-08-21 NOTE — ED Provider Notes (Signed)
[facility]-EMERGENCY DEPT Provider Note   CSN: [id number] Arrival date & time: 08/21/20  1934     History Chief Complaint  Patient presents with  . Psychiatric Evaluation    Ian Munoz is a 40 y.o. male.  Patient here with CC of suicidal thoughts.  States that he wants to slit his throat.  States he was just at [facility] for similar and wants to go back.  Denies any recent illness.  Has been drinking ETOH.  Denies drug use.  States that he cut his right wrist earlier.  The history is provided by the patient. No language interpreter was used.       Past Medical History:  Diagnosis Date  . Depression   . Prostate enlargement     Patient Active Problem List   Diagnosis Date Noted  . Alcohol use disorder, severe, dependence (HCC) 08/07/2020  . 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 Substance induced mood disorder (HCC)   . Severe recurrent major depression without psychotic features (HCC) 08/06/2019    Past Surgical History:  Procedure Laterality Date  . EYE SURGERY    . WRIST SURGERY Right        Family History  Family history unknown: Yes    Social History   Tobacco Use  . Smoking status: Never Smoker  . Smokeless tobacco: Current User    Types: Chew  Substance Use Topics  . Alcohol use: No  . Drug use: No    Home Medications Prior to Admission medications   Medication Sig Start Date End Date Taking? Authorizing Provider  ARIPiprazole (ABILIFY) 5 MG tablet Take 1 tablet (5 mg total) by mouth daily. 08/13/20   Money, Gerlene Burdock, FNP  escitalopram (LEXAPRO) 10 MG tablet Take 2 tablets (20 mg total) by mouth daily. 08/13/20   Money, Gerlene Burdock, FNP  levothyroxine (SYNTHROID) 25 MCG tablet Take 1 tablet (25 mcg total) by mouth daily before breakfast. 08/13/20   Money, Gerlene Burdock, FNP  traZODone (DESYREL) 100 MG tablet Take 1 tablet (100 mg total) by mouth at bedtime as needed for sleep. 08/13/20   Money, Gerlene Burdock, FNP    Allergies     Patient has no known allergies.  Review of Systems   Review of Systems  All other systems reviewed and are negative.   Physical Exam Updated Vital Signs BP 105/63   Pulse (!) 102   Temp 99.5 F (37.5 C) (Oral)   Resp 18   Ht 5\' 2"  (1.575 m)   Wt 68 kg   SpO2 98%   BMI 27.42 kg/m   Physical Exam Vitals and nursing note reviewed.  Constitutional:      General: He is not in acute distress.    Appearance: He is well-developed. He is not ill-appearing.  HENT:     Head: Normocephalic and atraumatic.  Eyes:     Conjunctiva/sclera: Conjunctivae normal.  Cardiovascular:     Rate and Rhythm: Normal rate.  Pulmonary:     Effort: Pulmonary effort is normal. No respiratory distress.  Abdominal:     General: There is no distension.  Musculoskeletal:     Cervical back: Neck supple.     Comments: Moves all extremities  Skin:    General: Skin is warm and dry.     Comments: Very mild scrape on right anterior forearm, nothing requiring repair  Neurological:     Mental Status: He is alert and oriented to person, place, and time.  Psychiatric:  Mood and Affect: Mood normal.        Behavior: Behavior normal.     ED Results / Procedures / Treatments   Labs (all labs ordered are listed, but only abnormal results are displayed) Labs Reviewed  RAPID URINE DRUG SCREEN, HOSP PERFORMED - Abnormal; Notable for the following components:      Result Value   Benzodiazepines POSITIVE (*)    All other components within normal limits  CBC WITH DIFFERENTIAL/PLATELET - Abnormal; Notable for the following components:   RBC 4.14 (*)    MCV 102.9 (*)    MCH 34.5 (*)    All other components within normal limits  RESP PANEL BY RT-PCR (FLU A&B, COVID) ARPGX2  COMPREHENSIVE METABOLIC PANEL  ETHANOL  SALICYLATE LEVEL  ACETAMINOPHEN LEVEL    EKG None  Radiology No results found.  Procedures Procedures   Medications Ordered in ED Medications  Tdap (BOOSTRIX) injection 0.5 mL  (0.5 mLs Intramuscular Given 08/21/20 2227)    ED Course  I have reviewed the triage vital signs and the nursing notes.  Pertinent labs & imaging results that were available during my care of the patient were reviewed by me and considered in my medical decision making (see chart for details).    MDM Rules/Calculators/A&P                          Patient here for SI.  States that he wants to slit his neck.  Appears medically stable for TTS evaluation.  Final Clinical Impression(s) / ED Diagnoses Final diagnoses:  Suicidal ideation    Rx / DC Orders ED Discharge Orders    None       Roxy Horseman, PA-C 08/21/20 2241    Rolan Bucco, MD 08/21/20 2245

## 2020-08-22 LAB — RESP PANEL BY RT-PCR (FLU A&B, COVID) ARPGX2
Influenza A by PCR: NEGATIVE
Influenza B by PCR: NEGATIVE
SARS Coronavirus 2 by RT PCR: NEGATIVE

## 2020-08-22 NOTE — ED Provider Notes (Signed)
He has been seen and cleared by TTS.  He is going back to his current living setting, with instructions for follow-up provided by TTS.   Mancel Bale, MD 08/22/20 1316

## 2020-08-22 NOTE — ED Notes (Signed)
Security arrived to wand pt.  

## 2020-08-22 NOTE — ED Notes (Signed)
TTS on with patient at this time 

## 2020-08-22 NOTE — BH Assessment (Addendum)
Clinician at [phone] via secure chat requested nursing [facility], RN) to set up TTS machine for patient to complete his initial TTS assessment.

## 2020-08-22 NOTE — ED Notes (Signed)
Pt voided 3 times, ambulatory to restroom with steady gait.

## 2020-08-22 NOTE — ED Notes (Signed)
Patient set up in conference room to do TTS assessment at this time

## 2020-08-22 NOTE — BH Assessment (Addendum)
Disposition: TTS assessment completed. Discussed clinical information with (provider) Vernard Gambles, NP, who determined that patient does not meet criteria for INPT treatment. Patient is Psych Cleared to discharge back to his home ([facility]). Patient expressed interest in followinig up with outpatient substance use programs. Also, requested a referral to a local therapist and/or psychiatrist.   Clinician noted in patient's AVS disposition follow up referrals for substance use programs: [facility] in [place], [facility] in [place], [facility] in [place], and [facility] in [place] for residential treatment. Also, [facility] for CDIOP.   Additionally, patient has follow up referrals noted in his AVS to schedule an appointment with a therapist and psychiatrist at the [facility]. Patient advised on his AVS that can also walk in during oupatient clinic hours to see a therapist and/or psychiatrist.   Disposition updates as noted above were  provided to EDP (Dr. Harlow Ohms), Nursing Administration (Joelyn Oms, RN), Disposition Social Worker (Michaela, LCSW), and patient's nurse [facility], RN) via secure chat. Also, Press photographer Susy Frizzle, RN) given verbal updates.

## 2020-08-22 NOTE — BH Assessment (Addendum)
Clinician received an internal secure chat from one of pt's nurses at [phone] stating that they could, at that time, move pt to the conference room to complete pt's MH Assessment. Clinician responded that all 3 TTS staff had either just finished or were in the process of seeing pts at that time but that we would attempt to see pt later if time alotted.   As of 0700 pt is in a hallway bed and is unable to be seen at this time.

## 2020-08-22 NOTE — ED Notes (Signed)
Patient continues to rest in the chair with eyes closed

## 2020-08-22 NOTE — ED Notes (Signed)
Patient provided with breakfast tray at this time.

## 2020-08-22 NOTE — BH Assessment (Addendum)
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    Referred to Alternative Service(s):   Place:   Date:   Time:     Melynda Rippleoyka Stavroula Rohde, Counselor

## 2020-08-22 NOTE — ED Notes (Signed)
Pt dressed in purple scrubs, security called to come wand.  Belongings placed into appropriate secured locked cabinet rooms 1-4. He has two bags.

## 2020-08-25 ENCOUNTER — Encounter (HOSPITAL_COMMUNITY): Payer: Self-pay | Admitting: Emergency Medicine

## 2020-08-25 ENCOUNTER — Emergency Department (HOSPITAL_COMMUNITY)
Admission: EM | Admit: 2020-08-25 | Discharge: 2020-08-25 | Disposition: A | Discharge status: Home / Self Care | Payer: 59 | Attending: Emergency Medicine | Admitting: Emergency Medicine

## 2020-08-25 ENCOUNTER — Other Ambulatory Visit: Payer: Self-pay

## 2020-08-25 DIAGNOSIS — R000 Tachycardia, unspecified: Secondary | ICD-10-CM | POA: Insufficient documentation

## 2020-08-25 DIAGNOSIS — X102XXA Contact with fats and cooking oils, initial encounter: Secondary | ICD-10-CM | POA: Insufficient documentation

## 2020-08-25 DIAGNOSIS — T23222A Burn of second degree of single left finger (nail) except thumb, initial encounter: Secondary | ICD-10-CM | POA: Insufficient documentation

## 2020-08-25 DIAGNOSIS — F17220 Nicotine dependence, chewing tobacco, uncomplicated: Secondary | ICD-10-CM | POA: Insufficient documentation

## 2020-08-25 DIAGNOSIS — R21 Rash and other nonspecific skin eruption: Secondary | ICD-10-CM | POA: Insufficient documentation

## 2020-08-25 MED ORDER — FAMOTIDINE 20 MG PO TABS
20.0000 mg | ORAL_TABLET | Freq: Once | ORAL | Status: AC
  Administered 2020-08-25: 20 mg via ORAL
  Filled 2020-08-25: qty 1

## 2020-08-25 MED ORDER — FAMOTIDINE 20 MG PO TABS: 20.0000 mg | ORAL_TABLET | Freq: Two times a day (BID) | ORAL | 0 refills | Status: DC

## 2020-08-25 MED ORDER — PREDNISONE 20 MG PO TABS
60.0000 mg | ORAL_TABLET | Freq: Once | ORAL | Status: AC
  Administered 2020-08-25: 60 mg via ORAL
  Filled 2020-08-25: qty 3

## 2020-08-25 MED ORDER — DIPHENHYDRAMINE HCL 25 MG PO CAPS
50.0000 mg | ORAL_CAPSULE | Freq: Once | ORAL | Status: AC
  Administered 2020-08-25: 50 mg via ORAL
  Filled 2020-08-25: qty 2

## 2020-08-25 MED ORDER — DIPHENHYDRAMINE HCL 25 MG PO TABS: 25.0000 mg | ORAL_TABLET | Freq: Four times a day (QID) | ORAL | 0 refills | Status: DC | PRN

## 2020-08-25 MED ORDER — SILVER SULFADIAZINE 1 % EX CREA
TOPICAL_CREAM | Freq: Once | CUTANEOUS | Status: AC
  Administered 2020-08-25: 1 via TOPICAL
  Filled 2020-08-25: qty 50

## 2020-08-25 MED ORDER — PREDNISONE 20 MG PO TABS: ORAL_TABLET | ORAL | 0 refills | Status: DC

## 2020-08-25 NOTE — Discharge Instructions (Signed)
Your rash is likely due to an allergic reaction.  Please take steroid, Benadryl, and Pepcid as prescribed for symptom control.  You have a burn wound to her left index finger.  Apply Silvadene 2-3 times daily for the next week to decrease risk of infection.  Return to the ER if you have any concern.

## 2020-08-25 NOTE — ED Provider Notes (Signed)
[facility]-EMERGENCY DEPT Provider Note   CSN: [id number] Arrival date & time: 08/25/20  1645     History Chief Complaint  Patient presents with  . Rash    Ian Munoz is a 40 y.o. male.  The history is provided by the patient and medical records. No language interpreter was used.  Rash    40 year old male significant history of depression, polysubstance abuse including alcohol and meth use who presents for a rash.  Patient report and today he developed itchy rash to his arms, around his belt waist, on his legs.  Rash is very itchy, with some pustule that appears and have erupted oozing out clear fluid.  He is unsure what may have triggered this rash.  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  He denies any recent exposure to poison oak, poison ivy, poison sumac.  Denies any recent change in any medication, soap, detergent, or other environmental changes.  Does not complain of any significant throat swelling, tongue swelling, trouble breathing, shortness of breath, wheezing, or abdominal cramping.  He denies any specific treatment tried.  No cold symptoms  Furthermore, patient also request to be evaluated for a burn wound to his left index finger from cooking hamburger last night.  States the wound is tender to the touch, and have been oozing out fluid requiring him to change his dressing multiple times throughout the day today.  Pain is burning and sharp mild to moderate in severity.    Past Medical History:  Diagnosis Date  . Depression   . Prostate enlargement     Patient Active Problem List   Diagnosis Date Noted  . Alcohol use disorder, severe, dependence (HCC) 08/07/2020  . 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 Substance induced mood disorder (HCC)   . Severe recurrent major depression without psychotic features (HCC) 08/06/2019    Past Surgical History:   Procedure Laterality Date  . EYE SURGERY    . WRIST SURGERY Right        Family History  Family history unknown: Yes    Social History   Tobacco Use  . Smoking status: Never Smoker  . Smokeless tobacco: Current User    Types: Chew  Substance Use Topics  . Alcohol use: No  . Drug use: No    Home Medications Prior to Admission medications   Medication Sig Start Date End Date Taking? Authorizing Provider  ARIPiprazole (ABILIFY) 5 MG tablet Take 1 tablet (5 mg total) by mouth daily. 08/13/20   Money, Gerlene Burdock, FNP  escitalopram (LEXAPRO) 10 MG tablet Take 2 tablets (20 mg total) by mouth daily. 08/13/20   Money, Gerlene Burdock, FNP  levothyroxine (SYNTHROID) 25 MCG tablet Take 1 tablet (25 mcg total) by mouth daily before breakfast. 08/13/20   Money, Gerlene Burdock, FNP  traZODone (DESYREL) 100 MG tablet Take 1 tablet (100 mg total) by mouth at bedtime as needed for sleep. 08/13/20   Money, Gerlene Burdock, FNP    Allergies    Patient has no known allergies.  Review of Systems   Review of Systems  Skin: Positive for rash.  All other systems reviewed and are negative.   Physical Exam Updated Vital Signs BP 133/82   Pulse (!) 110   Temp 97.8 F (36.6 C)   Resp 18   SpO2 97%   Physical Exam Vitals and nursing note reviewed.  Constitutional:  General: He is not in acute distress.    Appearance: He is well-developed.  HENT:     Head: Atraumatic.     Mouth/Throat:     Comments: Normal oral mucosa, no tongue edema, no change in phonation Eyes:     Conjunctiva/sclera: Conjunctivae normal.  Cardiovascular:     Rate and Rhythm: Tachycardia present.     Pulses: Normal pulses.     Heart sounds: Normal heart sounds.  Pulmonary:     Effort: Pulmonary effort is normal.     Breath sounds: Normal breath sounds. No wheezing, rhonchi or rales.  Abdominal:     Palpations: Abdomen is soft.     Tenderness: There is no abdominal tenderness.  Musculoskeletal:        General: Signs of injury  (Left hand: There is a partial-thickness second-degree burn approximately 3 x 1 cm noted to the medial proximal index finger oozing out serous fluid without erythema or pus) present.     Cervical back: Neck supple.  Skin:    Findings: Rash (Diffuse maculopapular rash noted to bilateral arms, around the waist, bilateral knees.  No rash on palms of hands or soles of feet) present.  Neurological:     Mental Status: He is alert. Mental status is at baseline.     Comments: Right hand: Decreased sensation to middle finger with normal range of motion throughout all fingers with brisk cap refill.  No deformity noted.  Normal grip strength  Psychiatric:        Mood and Affect: Mood normal.     ED Results / Procedures / Treatments   Labs (all labs ordered are listed, but only abnormal results are displayed) Labs Reviewed - No data to display  EKG None  Radiology No results found.  Procedures Procedures   Medications Ordered in ED Medications  predniSONE (DELTASONE) tablet 60 mg (60 mg Oral Given 08/25/20 1724)  diphenhydrAMINE (BENADRYL) capsule 50 mg (50 mg Oral Given 08/25/20 1724)  famotidine (PEPCID) tablet 20 mg (20 mg Oral Given 08/25/20 1724)  silver sulfADIAZINE (SILVADENE) 1 % cream (1 application Topical Given 08/25/20 1725)    ED Course  I have reviewed the triage vital signs and the nursing notes.  Pertinent labs & imaging results that were available during my care of the patient were reviewed by me and considered in my medical decision making (see chart for details).    MDM Rules/Calculators/A&P                          BP 133/82   Pulse (!) 110   Temp 97.8 F (36.6 C)   Resp 18   SpO2 97%   Final Clinical Impression(s) / ED Diagnoses Final diagnoses:  None    Rx / DC Orders ED Discharge Orders    None     5:17 PM Patient developed itchy rash to his extremities and around his belt line that started today.  It is likely an allergic rash possible contact  dermatitis.  No evidence of anaphylaxis.  Unknown provocation.  Will treat with prednisone, Benadryl, and Pepcid  Patient also suffered a second-degree partial-thickness burn to his left hand adjacent to the proximal phalanx of the index finger.  It does not appear infected.  We will apply Silvadene and provide appropriate wound care.  6:29 PM Patient has been monitoring without worsening symptoms.  He is stable for discharge.   Fayrene Helper, PA-C 08/25/20 1830  Terald Sleeper, MD 08/25/20 [phone]

## 2020-08-25 NOTE — ED Triage Notes (Signed)
Patient reports itching rash to bilateral arms since this morning. 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 Also c/o burn to left index finger since last night. Denies throat swelling, difficulty breathing.

## 2020-08-29 ENCOUNTER — Emergency Department (HOSPITAL_COMMUNITY)
Admission: EM | Admit: 2020-08-29 | Discharge: 2020-08-30 | Disposition: A | Discharge status: Home / Self Care | Payer: 59 | Attending: Emergency Medicine | Admitting: Emergency Medicine

## 2020-08-29 ENCOUNTER — Encounter (HOSPITAL_COMMUNITY): Payer: Self-pay

## 2020-08-29 ENCOUNTER — Other Ambulatory Visit: Payer: Self-pay

## 2020-08-29 DIAGNOSIS — F1924 Other psychoactive substance dependence with psychoactive substance-induced mood disorder: Secondary | ICD-10-CM | POA: Diagnosis not present

## 2020-08-29 DIAGNOSIS — F1020 Alcohol dependence, uncomplicated: Secondary | ICD-10-CM | POA: Diagnosis present

## 2020-08-29 DIAGNOSIS — R45851 Suicidal ideations: Secondary | ICD-10-CM | POA: Insufficient documentation

## 2020-08-29 DIAGNOSIS — Y908 Blood alcohol level of 240 mg/100 ml or more: Secondary | ICD-10-CM | POA: Insufficient documentation

## 2020-08-29 DIAGNOSIS — F1994 Other psychoactive substance use, unspecified with psychoactive substance-induced mood disorder: Secondary | ICD-10-CM | POA: Diagnosis present

## 2020-08-29 DIAGNOSIS — Z20822 Contact with and (suspected) exposure to covid-19: Secondary | ICD-10-CM | POA: Insufficient documentation

## 2020-08-29 DIAGNOSIS — F17220 Nicotine dependence, chewing tobacco, uncomplicated: Secondary | ICD-10-CM | POA: Diagnosis not present

## 2020-08-29 DIAGNOSIS — F1010 Alcohol abuse, uncomplicated: Secondary | ICD-10-CM | POA: Diagnosis not present

## 2020-08-29 DIAGNOSIS — F10929 Alcohol use, unspecified with intoxication, unspecified: Secondary | ICD-10-CM | POA: Diagnosis present

## 2020-08-29 LAB — CBC WITH DIFFERENTIAL/PLATELET
Abs Immature Granulocytes: 0.03 10*3/uL (ref 0.00–0.07)
Basophils Absolute: 0 10*3/uL (ref 0.0–0.1)
Basophils Relative: 0 %
Eosinophils Absolute: 0 10*3/uL (ref 0.0–0.5)
Eosinophils Relative: 0 %
HCT: 45 % (ref 39.0–52.0)
Hemoglobin: 14.9 g/dL (ref 13.0–17.0)
Immature Granulocytes: 0 %
Lymphocytes Relative: 37 %
Lymphs Abs: 3.8 10*3/uL (ref 0.7–4.0)
MCH: 34.7 pg — ABNORMAL HIGH (ref 26.0–34.0)
MCHC: 33.1 g/dL (ref 30.0–36.0)
MCV: 104.9 fL — ABNORMAL HIGH (ref 80.0–100.0)
Monocytes Absolute: 0.5 10*3/uL (ref 0.1–1.0)
Monocytes Relative: 5 %
Neutro Abs: 5.7 10*3/uL (ref 1.7–7.7)
Neutrophils Relative %: 58 %
Platelets: 324 10*3/uL (ref 150–400)
RBC: 4.29 MIL/uL (ref 4.22–5.81)
RDW: 13.5 % (ref 11.5–15.5)
WBC: 10.1 10*3/uL (ref 4.0–10.5)
nRBC: 0 % (ref 0.0–0.2)

## 2020-08-29 LAB — RESP PANEL BY RT-PCR (FLU A&B, COVID) ARPGX2
Influenza A by PCR: NEGATIVE
Influenza B by PCR: NEGATIVE
SARS Coronavirus 2 by RT PCR: NEGATIVE

## 2020-08-29 LAB — COMPREHENSIVE METABOLIC PANEL
ALT: 60 U/L — ABNORMAL HIGH (ref 0–44)
AST: 21 U/L (ref 15–41)
Albumin: 4.5 g/dL (ref 3.5–5.0)
Alkaline Phosphatase: 91 U/L (ref 38–126)
Anion gap: 8 (ref 5–15)
BUN: 10 mg/dL (ref 6–20)
CO2: 30 mmol/L (ref 22–32)
Calcium: 8.9 mg/dL (ref 8.9–10.3)
Chloride: 101 mmol/L (ref 98–111)
Creatinine, Ser: 0.86 mg/dL (ref 0.61–1.24)
GFR, Estimated: >60 mL/min (ref >60)
Glucose, Bld: 100 mg/dL — ABNORMAL HIGH (ref 70–99)
Potassium: 3.7 mmol/L (ref 3.5–5.1)
Sodium: 139 mmol/L (ref 135–145)
Total Bilirubin: 0.3 mg/dL (ref 0.3–1.2)
Total Protein: 8.4 g/dL — ABNORMAL HIGH (ref 6.5–8.1)

## 2020-08-29 LAB — ETHANOL: Alcohol, Ethyl (B): 313 mg/dL (ref <10)

## 2020-08-29 LAB — RAPID URINE DRUG SCREEN, HOSP PERFORMED
Amphetamines: NOT DETECTED
Barbiturates: NOT DETECTED
Benzodiazepines: NOT DETECTED
Cocaine: NOT DETECTED
Opiates: NOT DETECTED
Tetrahydrocannabinol: NOT DETECTED

## 2020-08-29 LAB — ACETAMINOPHEN LEVEL: Acetaminophen (Tylenol), Serum: <10 ug/mL — ABNORMAL LOW (ref 10–30)

## 2020-08-29 LAB — CBG MONITORING, ED: Glucose-Capillary: 119 mg/dL — ABNORMAL HIGH (ref 70–99)

## 2020-08-29 LAB — SALICYLATE LEVEL: Salicylate Lvl: <7 mg/dL — ABNORMAL LOW (ref 7.0–30.0)

## 2020-08-29 MED ORDER — HALOPERIDOL LACTATE 5 MG/ML IJ SOLN
5.0000 mg | Freq: Once | INTRAMUSCULAR | Status: AC
  Administered 2020-08-29: 5 mg via INTRAMUSCULAR
  Filled 2020-08-29: qty 1

## 2020-08-29 MED ORDER — LORAZEPAM 2 MG/ML IJ SOLN: 2.0000 mg | Freq: Once | INTRAMUSCULAR | Status: DC

## 2020-08-29 MED ORDER — ZIPRASIDONE MESYLATE 20 MG IM SOLR: 20.0000 mg | Freq: Once | INTRAMUSCULAR | Status: DC

## 2020-08-29 MED ORDER — LORAZEPAM 2 MG/ML IJ SOLN
2.0000 mg | Freq: Once | INTRAMUSCULAR | Status: AC
  Administered 2020-08-29: 2 mg via INTRAMUSCULAR
  Filled 2020-08-29: qty 1

## 2020-08-29 NOTE — ED Provider Notes (Signed)
[facility]-EMERGENCY DEPT Provider Note   CSN: [id number] Arrival date & time: 08/29/20  2100     History Chief Complaint  Patient presents with  . Suicidal  Level 5 caveat due to psychiatric condition  Ian Munoz is a 40 y.o. male.  HPI    Patient with history of depression presents voluntarily with alcohol intoxication and suicidal ideation.  Patient apparently called 911 and told him he was in a harm himself.  He reports drinking large quantities of beer.  Patient also had access to weapons. Patient is not cooperative with history Past Medical History:  Diagnosis Date  . Depression   . Prostate enlargement     Patient Active Problem List   Diagnosis Date Noted  . Alcohol use disorder, severe, dependence (HCC) 08/07/2020  . 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 Substance induced mood disorder (HCC)   . Severe recurrent major depression without psychotic features (HCC) 08/06/2019    Past Surgical History:  Procedure Laterality Date  . EYE SURGERY    . WRIST SURGERY Right        Family History  Family history unknown: Yes    Social History   Tobacco Use  . Smoking status: Never Smoker  . Smokeless tobacco: Current User    Types: Chew  Substance Use Topics  . Alcohol use: No  . Drug use: No    Home Medications Prior to Admission medications   Medication Sig Start Date End Date Taking? Authorizing Provider  ARIPiprazole (ABILIFY) 5 MG tablet Take 1 tablet (5 mg total) by mouth daily. 08/13/20   Money, Gerlene Burdock, FNP  diphenhydrAMINE (BENADRYL) 25 MG tablet Take 1 tablet (25 mg total) by mouth every 6 (six) hours as needed for itching. 08/25/20   Fayrene Helper, PA-C  escitalopram (LEXAPRO) 10 MG tablet Take 2 tablets (20 mg total) by mouth daily. 08/13/20   Money, Gerlene Burdock, FNP  famotidine (PEPCID) 20 MG tablet Take 1 tablet (20 mg total) by mouth 2 (two) times daily. 08/25/20   Fayrene Helper, PA-C  levothyroxine (SYNTHROID) 25 MCG  tablet Take 1 tablet (25 mcg total) by mouth daily before breakfast. 08/13/20   Money, Gerlene Burdock, FNP  predniSONE (DELTASONE) 20 MG tablet 3 tabs po day one, then 2 po daily x 4 days 08/25/20   Fayrene Helper, PA-C  traZODone (DESYREL) 100 MG tablet Take 1 tablet (100 mg total) by mouth at bedtime as needed for sleep. 08/13/20   Money, Gerlene Burdock, FNP    Allergies    Wellbutrin [bupropion]  Review of Systems   Review of Systems  Unable to perform ROS: Psychiatric disorder    Physical Exam Updated Vital Signs BP (!) 113/98   Pulse 99   Temp 98 F (36.7 C) (Oral)   Resp 18   Ht 1.575 m (5\' 2" )   Wt 68 kg   SpO2 97%   BMI 27.42 kg/m   Physical Exam CONSTITUTIONAL: Disheveled, agitated HEAD: Normocephalic/atraumatic EYES: EOMI/PERRL ENMT: Mucous membranes moist NECK: supple no meningeal signs CV: S1/S2 noted, no murmurs/rubs/gallops noted LUNGS: Lungs are clear to auscultation bilaterally, no apparent distress ABDOMEN: soft, nontender, no bruising NEURO: Pt is awake/alert, moves all extremitiesx4.  No facial droop.  Patient walking around the room yelling and agitated EXTREMITIES: pulses normal/equal, full ROM, no deformities SKIN: warm, color normal PSYCH: Patient is agitated and cursing ED Results / Procedures / Treatments   Labs (all labs ordered are listed, but only abnormal results are  displayed) Labs Reviewed  COMPREHENSIVE METABOLIC PANEL - Abnormal; Notable for the following components:      Result Value   Glucose, Bld 100 (*)    Total Protein 8.4 (*)    ALT 60 (*)    All other components within normal limits  ETHANOL - Abnormal; Notable for the following components:   Alcohol, Ethyl (B) 313 (*)    All other components within normal limits  CBC WITH DIFFERENTIAL/PLATELET - Abnormal; Notable for the following components:   MCV 104.9 (*)    MCH 34.7 (*)    All other components within normal limits  SALICYLATE LEVEL - Abnormal; Notable for the following components:    Salicylate Lvl <7.0 (*)    All other components within normal limits  ACETAMINOPHEN LEVEL - Abnormal; Notable for the following components:   Acetaminophen (Tylenol), Serum <10 (*)    All other components within normal limits  CBG MONITORING, ED - Abnormal; Notable for the following components:   Glucose-Capillary 119 (*)    All other components within normal limits  RESP PANEL BY RT-PCR (FLU A&B, COVID) ARPGX2  RAPID URINE DRUG SCREEN, HOSP PERFORMED    EKG EKG Interpretation  Date/Time:  Thursday Aug 29 2020 22:14:16 EDT Ventricular Rate:  88 PR Interval:  138 QRS Duration: 85 QT Interval:  361 QTC Calculation: 437 R Axis:   71 Text Interpretation: Sinus rhythm ST elev, probable normal early repol pattern Confirmed by Zadie Rhine ([id number]) on 08/29/2020 11:31:46 PM   Radiology No results found.  Procedures .Critical Care Performed by: Zadie Rhine, MD Authorized by: Zadie Rhine, MD   Critical care provider statement:    Critical care time (minutes):  31   Critical care start time:  08/29/2020 11:30 PM   Critical care end time:  08/30/2020 12:01 AM   Critical care time was exclusive of:  Separately billable procedures and treating other patients   Critical care was necessary to treat or prevent imminent or life-threatening deterioration of the following conditions:  Toxidrome   Critical care was time spent personally by me on the following activities:  Examination of patient, evaluation of patient's response to treatment, ordering and review of laboratory studies, pulse oximetry, re-evaluation of patient's condition, ordering and performing treatments and interventions and development of treatment plan with patient or surrogate   I assumed direction of critical care for this patient from another provider in my specialty: no     Care discussed with: admitting provider       Medications Ordered in ED Medications  haloperidol lactate (HALDOL) injection 5 mg (5 mg  Intramuscular Given 08/29/20 2343)  LORazepam (ATIVAN) injection 2 mg (2 mg Intramuscular Given 08/29/20 2343)    ED Course  I have reviewed the triage vital signs and the nursing notes.  Pertinent labs results that were available during my care of the patient were reviewed by me and considered in my medical decision making (see chart for details).    MDM Rules/Calculators/A&P                          11:59 PM Patient presents with alcohol abuse and suicidal ideation.  When I came to the room, patient was ripping off his monitor leads and was reporting he was leaving.  Patient reports he is still suicidal and wants to harm himself.  I then informed patient that since he is a threat to himself I will have to place him under involuntary  commitment.  Patient then attempted to leave the emergency department and was guided back to the room with security.  Patient became extremely violent and threatening, and required four-point restraints and medications. Patient is now resting comfortably and vital signs are appropriate on the monitor.  Labs have been reviewed  The patient has been placed in psychiatric observation due to the need to provide a safe environment for the patient while obtaining psychiatric consultation and evaluation, as well as ongoing medical and medication management to treat the patient's condition.  The patient has been placed under full IVC at this time.  Final Clinical Impression(s) / ED Diagnoses Final diagnoses:  Suicidal ideation  Alcohol abuse    Rx / DC Orders ED Discharge Orders    None       Zadie Rhine, MD 08/30/20 0002

## 2020-08-29 NOTE — ED Notes (Signed)
Patient states he is here because he has no function in his brain or arm. Patient also state she has SI thoughts, but no HI thought. States "I thought about strangling myself, squeezing my arm or neck." Patient states he has not acted on this plan. Patient states he is drunk.

## 2020-08-29 NOTE — ED Triage Notes (Signed)
Pt to ED in custody of PD from home voluntarily with acute ETOH intoxication and SI. Pt called 911 with intent to har himself. Endorses drinking 8x40oz beers. Pt had a rope and knife on his person, this has been secured by security. Arrives A+O, VSS, NADN.

## 2020-08-29 NOTE — ED Provider Notes (Signed)
Emergency Medicine Provider Triage Evaluation Note  Ian Munoz , a 41 y.o. male  was evaluated in triage.  Pt complains of suicidal ideations, homicidal ideations, visual hallucinations, alcohol intoxication.  Patient was brought to emergency department in custody of police department due to EtOH intoxication and suicidal ideations.  Patient reports he has had suicidal thoughts over the last 3 to 4 weeks.  Patient reports that he has a plan to slit his throat with a knife.  Patient was found with a knife on his person when this department arrived.  Patient endorses previous suicide attempts in the past.  Patient endorses homicidal ideations.  Patient endorses visual hallucinations.  Stating that he sees shadows moving where they should not be.  Patient denies any auditory hallucinations.  Patient reports that he drank 812 ounce beers today.  Patient endorses methamphetamine and cocaine use however states he has not used in the month.  Review of Systems  Positive: Suicidal ideations, homicidal ideations, visual hallucinations Negative: Auditory hallucinations  Physical Exam  BP 104/74 (BP Location: Left Arm)   Pulse 72   Temp 98 F (36.7 C) (Oral)   Resp 16   Ht 5\' 2"  (1.575 m)   Wt 68 kg   SpO2 100%   BMI 27.42 kg/m  Gen:   Awake, no distress   Resp:  Normal effort  MSK:   Moves extremities without difficulty  Other:    Medical Decision Making  Medically screening exam initiated at 9:39 PM.  Appropriate orders placed.  OUSMAN DISE was informed that the remainder of the evaluation will be completed by another provider, this initial triage assessment does not replace that evaluation, and the importance of remaining in the ED until their evaluation is complete.  The patient appears stable so that the remainder of the work up may be completed by another provider.      Mellody Life, PA-C 08/30/20 0126    09/01/20, MD 09/07/20 [phone]

## 2020-08-29 NOTE — ED Notes (Signed)
Patient ambulated to the bathroom.

## 2020-08-30 MED ORDER — LORAZEPAM 1 MG PO TABS: 0.0000 mg | ORAL_TABLET | Freq: Two times a day (BID) | ORAL | Status: DC

## 2020-08-30 MED ORDER — ESCITALOPRAM OXALATE 10 MG PO TABS
20.0000 mg | ORAL_TABLET | Freq: Every day | ORAL | Status: DC
  Administered 2020-08-30: 20 mg via ORAL
  Filled 2020-08-30: qty 2

## 2020-08-30 MED ORDER — LORAZEPAM 1 MG PO TABS
0.0000 mg | ORAL_TABLET | Freq: Four times a day (QID) | ORAL | Status: DC
Start: 2020-08-30 — End: 2020-08-30

## 2020-08-30 MED ORDER — ARIPIPRAZOLE 5 MG PO TABS
5.0000 mg | ORAL_TABLET | Freq: Every day | ORAL | Status: DC
  Administered 2020-08-30: 5 mg via ORAL
  Filled 2020-08-30: qty 1

## 2020-08-30 MED ORDER — THIAMINE HCL 100 MG PO TABS
100.0000 mg | ORAL_TABLET | Freq: Every day | ORAL | Status: DC
  Administered 2020-08-30: 100 mg via ORAL
  Filled 2020-08-30: qty 1

## 2020-08-30 MED ORDER — DIPHENHYDRAMINE HCL 25 MG PO CAPS: 25.0000 mg | ORAL_CAPSULE | Freq: Four times a day (QID) | ORAL | Status: DC | PRN

## 2020-08-30 MED ORDER — TRAZODONE HCL 100 MG PO TABS: 100.0000 mg | ORAL_TABLET | Freq: Every evening | ORAL | Status: DC | PRN

## 2020-08-30 MED ORDER — THIAMINE HCL 100 MG/ML IJ SOLN: 100.0000 mg | Freq: Every day | INTRAMUSCULAR | Status: DC

## 2020-08-30 MED ORDER — LORAZEPAM 2 MG/ML IJ SOLN: 0.0000 mg | Freq: Two times a day (BID) | INTRAMUSCULAR | Status: DC

## 2020-08-30 MED ORDER — LORAZEPAM 2 MG/ML IJ SOLN: 0.0000 mg | Freq: Four times a day (QID) | INTRAMUSCULAR | Status: DC

## 2020-08-30 NOTE — ED Provider Notes (Signed)
Patient is now resting comfortably.  Restraints have been removed.  He is awaiting psychiatric evaluation   Zadie Rhine, MD 08/30/20 [phone]

## 2020-08-30 NOTE — ED Notes (Signed)
Patient is asleep.  

## 2020-08-30 NOTE — ED Notes (Signed)
Patient tolerated breakfast- patient is alert and cooperative at this time

## 2020-08-30 NOTE — ED Notes (Signed)
Patient is asleep. He has mild tremors, but does not show any signs of agitation when he woke up the CIWA assessment. Medication was not given.

## 2020-08-30 NOTE — [facility] Assessment (Signed)
[facility] Assessment Progress Note  Per Maxie Barb, NP, this pt does not require psychiatric hospitalization at this time.  Pt presents under IVC initiated by EDP Zadie Rhine, MD which has been rescinded by EDP Kristine Royal, MD.  Pt is psychiatrically cleared.  Discharge instructions include referral information for area substance abuse treatment providers, as well as area supportive services for the homeless.  Dr Rodena Medin and pt's nurse, Aram Beecham, have been notified.  Doylene Canning, MA Triage Specialist [phone]

## 2020-08-30 NOTE — ED Notes (Signed)
Patient was in hallway bed and discharge e sign was unavailable

## 2020-08-30 NOTE — ED Notes (Signed)
Written discharge instructions provided to the patient and reviewed.

## 2020-08-30 NOTE — ED Notes (Signed)
Patient appears to be sleeping.  Sitter is at the bedside.

## 2020-08-30 NOTE — ED Notes (Signed)
Pt given meal tray.

## 2020-08-30 NOTE — ED Notes (Signed)
Patient is released from restraints. He is asleep.

## 2020-08-30 NOTE — Consult Note (Signed)
[facility]   Reason for Consult:  pysch consult Referring Physician:  Zadie Rhineonald Wickline, MD Patient Identification: Ian Munoz MRN:  [id number] Principal Diagnosis: Substance induced mood disorder (HCC) Diagnosis:  Principal Problem:   Substance induced mood disorder (HCC) Active Problems:   Alcohol use disorder, severe, dependence (HCC)  Total Time spent with patient: 30 minutes  Subjective:   Ian Munoz is a 40 y.o. male patient admitted with alcohol intoxication and suicidal ideation after calling 911 and threatening to harm himself following self report of drinking large quantities of beer.  On assessment patient presents lying in bed states he's "feeling better" says he was living in "sober house" [name] and can't return for 14 days due to recent intoxication. Patient denies any active suicidal or homicidal ideations, auditory or visual hallucinations, and does not appear to be responding to any external/internal stimuli at this time. Patient is requesting assistance with housing resources and alcohol treatment.   HPI:   Ian Munoz is a 40 year old [facility] intoxication and suicidal ideation. Patient has past psychiatric history of substance induced mood disorder, alcohol use disorder, severe, dependence, and depression. He is currently working at [employer] and was recently living in an [facility].    Past Psychiatric History:   -substance induced mood disorder  -alcohol use disorder, severe, dependence  Risk to Self:  pt denies Risk to Others:  pt denies Prior Inpatient Therapy:  yes Prior Outpatient Therapy:  yes  Past Medical History:  Past Medical History:  Diagnosis Date  . Depression   . Prostate enlargement     Past Surgical History:  Procedure Laterality Date  . EYE SURGERY    . WRIST SURGERY Right    Family History:  Family History  Family history unknown: Yes   Family Psychiatric   History:   -not noted Social History:  Social History   Substance and Sexual Activity  Alcohol Use No     Social History   Substance and Sexual Activity  Drug Use No    Social History   Socioeconomic History  . Marital status: Single    Spouse name: Not on file  . Number of children: Not on file  . Years of education: Not on file  . Highest education level: Not on file  Occupational History  . Not on file  Tobacco Use  . Smoking status: Never Smoker  . Smokeless tobacco: Current User    Types: Chew  Substance and Sexual Activity  . Alcohol use: No  . Drug use: No  . Sexual activity: Yes    Birth control/protection: None  Other Topics Concern  . Not on file  Social History Narrative  . Not on file   Social Determinants of Health   Financial Resource Strain: Not on file  Food Insecurity: Not on file  Transportation Needs: Not on file  Physical Activity: Not on file  Stress: Not on file  Social Connections: Not on file   Additional Social History:   Allergies:   Allergies  Allergen Reactions  . Wellbutrin [Bupropion]     Constipation    Labs:  Results for orders placed or performed during the hospital encounter of 08/29/20 (from the past 48 hour(s))  Resp Panel by RT-PCR (Flu A&B, Covid) Nasopharyngeal Swab     Status: None   Collection Time: 08/29/20  9:42 PM   Specimen: Nasopharyngeal Swab; Nasopharyngeal(NP) swabs in vial transport medium  Result  Value Ref Range   SARS Coronavirus 2 by RT PCR NEGATIVE NEGATIVE    Comment: (NOTE) SARS-CoV-2 target nucleic acids are NOT DETECTED.  The SARS-CoV-2 RNA is generally detectable in upper respiratory specimens during the acute phase of infection. The lowest concentration of SARS-CoV-2 viral copies this assay can detect is 138 copies/mL. A negative result does not preclude SARS-Cov-2 infection and should not be used as the sole basis for treatment or other patient management decisions. A negative result may  occur with  improper specimen collection/handling, submission of specimen other than nasopharyngeal swab, presence of viral mutation(s) within the areas targeted by this assay, and inadequate number of viral copies(<138 copies/mL). A negative result must be combined with clinical observations, patient history, and epidemiological information. The expected result is Negative.  Fact Sheet for Patients:  BloggerCourse.com  Fact Sheet for Healthcare Providers:  SeriousBroker.it  This test is no t yet approved or cleared by the [place] FDA and  has been authorized for detection and/or diagnosis of SARS-CoV-2 by FDA under an Emergency Use Authorization (EUA). This EUA will remain  in effect (meaning this test can be used) for the duration of the COVID-19 declaration under Section 564(b)(1) of the Act, 21 U.S.C.section 360bbb-3(b)(1), unless the authorization is terminated  or revoked sooner.       Influenza A by PCR NEGATIVE NEGATIVE   Influenza B by PCR NEGATIVE NEGATIVE    Comment: (NOTE) The Xpert Xpress SARS-CoV-2/FLU/RSV plus assay is intended as an aid in the diagnosis of influenza from Nasopharyngeal swab specimens and should not be used as a sole basis for treatment. Nasal washings and aspirates are unacceptable for Xpert Xpress SARS-CoV-2/FLU/RSV testing.  Fact Sheet for Patients: BloggerCourse.com  Fact Sheet for Healthcare Providers: SeriousBroker.it  This test is not yet approved or cleared by the [place] FDA and has been authorized for detection and/or diagnosis of SARS-CoV-2 by FDA under an Emergency Use Authorization (EUA). This EUA will remain in effect (meaning this test can be used) for the duration of the COVID-19 declaration under Section 564(b)(1) of the Act, 21 U.S.C. section 360bbb-3(b)(1), unless the authorization is terminated  or revoked.  Performed at [facility], [address] [place], [place] [address]   Comprehensive metabolic panel     Status: Abnormal   Collection Time: 08/29/20  9:42 PM  Result Value Ref Range   Sodium 139 135 - 145 mmol/L   Potassium 3.7 3.5 - 5.1 mmol/L   Chloride 101 98 - 111 mmol/L   CO2 30 22 - 32 mmol/L   Glucose, Bld 100 (H) 70 - 99 mg/dL    Comment: Glucose reference range applies only to samples taken after fasting for at least 8 hours.   BUN 10 6 - 20 mg/dL   Creatinine, Ser 7.78 0.61 - 1.24 mg/dL   Calcium 8.9 8.9 - 24.2 mg/dL   Total Protein 8.4 (H) 6.5 - 8.1 g/dL   Albumin 4.5 3.5 - 5.0 g/dL   AST 21 15 - 41 U/L   ALT 60 (H) 0 - 44 U/L   Alkaline Phosphatase 91 38 - 126 U/L   Total Bilirubin 0.3 0.3 - 1.2 mg/dL   GFR, Estimated >35 >36 mL/min    Comment: (NOTE) Calculated using the CKD-EPI Creatinine Equation (2021)    Anion gap 8 5 - 15    Comment: Performed at [facility], [address] [place], [place] [address]  Ethanol     Status:  Abnormal   Collection Time: 08/29/20  9:42 PM  Result Value Ref Range   Alcohol, Ethyl (B) 313 (HH) <10 mg/dL    Comment: CRITICAL RESULT CALLED TO, READ BACK BY AND VERIFIED WITH: BANNO, RN @ [phone] ON 08/29/20 C VARNER (NOTE) Lowest detectable limit for serum alcohol is 10 mg/dL.  For medical purposes only. Performed at [facility], [address] [place], [place] [address]   Urine rapid drug screen (hosp performed)     Status: None   Collection Time: 08/29/20  9:42 PM  Result Value Ref Range   Opiates NONE DETECTED NONE DETECTED   Cocaine NONE DETECTED NONE DETECTED   Benzodiazepines NONE DETECTED NONE DETECTED   Amphetamines NONE DETECTED NONE DETECTED   Tetrahydrocannabinol NONE DETECTED NONE DETECTED   Barbiturates NONE DETECTED NONE DETECTED    Comment: (NOTE) DRUG SCREEN FOR MEDICAL PURPOSES ONLY.  IF CONFIRMATION IS NEEDED FOR ANY PURPOSE, NOTIFY  LAB WITHIN 5 DAYS.  LOWEST DETECTABLE LIMITS FOR URINE DRUG SCREEN Drug Class                     Cutoff (ng/mL) Amphetamine and metabolites    1000 Barbiturate and metabolites    200 Benzodiazepine                 200 Tricyclics and metabolites     300 Opiates and metabolites        300 Cocaine and metabolites        300 THC                            50 Performed at [facility], [address] [place], [place] [address]   CBC with Diff     Status: Abnormal   Collection Time: 08/29/20  9:42 PM  Result Value Ref Range   WBC 10.1 4.0 - 10.5 K/uL   RBC 4.29 4.22 - 5.81 MIL/uL   Hemoglobin 14.9 13.0 - 17.0 g/dL   HCT 17.4 94.4 - 96.7 %   MCV 104.9 (H) 80.0 - 100.0 fL   MCH 34.7 (H) 26.0 - 34.0 pg   MCHC 33.1 30.0 - 36.0 g/dL   RDW 59.1 63.8 - 46.6 %   Platelets 324 150 - 400 K/uL   nRBC 0.0 0.0 - 0.2 %   Neutrophils Relative % 58 %   Neutro Abs 5.7 1.7 - 7.7 K/uL   Lymphocytes Relative 37 %   Lymphs Abs 3.8 0.7 - 4.0 K/uL   Monocytes Relative 5 %   Monocytes Absolute 0.5 0.1 - 1.0 K/uL   Eosinophils Relative 0 %   Eosinophils Absolute 0.0 0.0 - 0.5 K/uL   Basophils Relative 0 %   Basophils Absolute 0.0 0.0 - 0.1 K/uL   Immature Granulocytes 0 %   Abs Immature Granulocytes 0.03 0.00 - 0.07 K/uL    Comment: Performed at [facility], [address] [place], [place] [address]  Salicylate level     Status: Abnormal   Collection Time: 08/29/20  9:42 PM  Result Value Ref Range   Salicylate Lvl <7.0 (L) 7.0 - 30.0 mg/dL    Comment: Performed at [facility], [address], [place], [place] [address]  Acetaminophen level     Status: Abnormal   Collection Time: 08/29/20  9:42 PM  Result Value Ref Range   Acetaminophen (Tylenol), Serum <10 (L) 10 - 30 ug/mL  Comment: (NOTE) Therapeutic concentrations vary significantly. A range of 10-30 ug/mL  may be an effective concentration for many patients. However, some  are  best treated at concentrations outside of this range. Acetaminophen concentrations >150 ug/mL at 4 hours after ingestion  and >50 ug/mL at 12 hours after ingestion are often associated with  toxic reactions.  Performed at [facility], [address] [place], [place] [address]   POC CBG, ED     Status: Abnormal   Collection Time: 08/29/20 11:51 PM  Result Value Ref Range   Glucose-Capillary 119 (H) 70 - 99 mg/dL    Comment: Glucose reference range applies only to samples taken after fasting for at least 8 hours.    Current Facility-Administered Medications  Medication Dose Route Frequency Provider Last Rate Last Admin  . ARIPiprazole (ABILIFY) tablet 5 mg  5 mg Oral Daily Zadie Rhine, MD   5 mg at 08/30/20 0917  . diphenhydrAMINE (BENADRYL) capsule 25 mg  25 mg Oral Q6H PRN Zadie Rhine, MD      . escitalopram (LEXAPRO) tablet 20 mg  20 mg Oral Daily Zadie Rhine, MD   20 mg at 08/30/20 0917  . LORazepam (ATIVAN) injection 0-4 mg  0-4 mg Intravenous Q6H Zadie Rhine, MD       Or  . LORazepam (ATIVAN) tablet 0-4 mg  0-4 mg Oral Q6H Zadie Rhine, MD      . Melene Muller ON 09/01/2020] LORazepam (ATIVAN) injection 0-4 mg  0-4 mg Intravenous Q12H Zadie Rhine, MD       Or  . Melene Muller ON 09/01/2020] LORazepam (ATIVAN) tablet 0-4 mg  0-4 mg Oral Q12H Zadie Rhine, MD      . thiamine tablet 100 mg  100 mg Oral Daily Zadie Rhine, MD   100 mg at 08/30/20 6045   Or  . thiamine (B-1) injection 100 mg  100 mg Intravenous Daily Zadie Rhine, MD      . traZODone (DESYREL) tablet 100 mg  100 mg Oral QHS PRN Zadie Rhine, MD       Current Outpatient Medications  Medication Sig Dispense Refill  . ARIPiprazole (ABILIFY) 5 MG tablet Take 1 tablet (5 mg total) by mouth daily. 30 tablet 0  . diphenhydrAMINE (BENADRYL) 25 MG tablet Take 1 tablet (25 mg total) by mouth every 6 (six) hours as needed for itching. 20 tablet 0  . escitalopram (LEXAPRO) 10 MG  tablet Take 2 tablets (20 mg total) by mouth daily. 30 tablet 0  . famotidine (PEPCID) 20 MG tablet Take 1 tablet (20 mg total) by mouth 2 (two) times daily. 10 tablet 0  . levothyroxine (SYNTHROID) 25 MCG tablet Take 1 tablet (25 mcg total) by mouth daily before breakfast. 30 tablet 0  . predniSONE (DELTASONE) 20 MG tablet 3 tabs po day one, then 2 po daily x 4 days 11 tablet 0  . traZODone (DESYREL) 100 MG tablet Take 1 tablet (100 mg total) by mouth at bedtime as needed for sleep. 30 tablet 0    Musculoskeletal: Strength & Muscle Tone: within normal limits Gait & Station: normal Patient leans: N/A   Psychiatric Specialty Exam:  Presentation  General Appearance: Appropriate for Environment; Casual  Eye Contact:Good  Speech:Clear and Coherent; Normal Rate  Speech Volume:Normal  Handedness:Right   Mood and Affect  Mood:Euthymic  Affect:Appropriate; Congruent   Thought Process  Thought Processes:Coherent  Descriptions of Associations:Intact  Orientation:Full (Time, Place and Person)  Thought Content:WDL  History of Schizophrenia/Schizoaffective disorder:No  Duration of Psychotic Symptoms:No data recorded Hallucinations:No data recorded Ideas of Reference:None  Suicidal Thoughts:No data recorded Homicidal Thoughts:No data recorded  Sensorium  Memory:Immediate Good; Recent Good; Remote Good  Judgment:Intact  Insight:Good   Executive Functions  Concentration:Good  Attention Span:Good  Recall:Good  Fund of Knowledge:Good  Language:Good   Psychomotor Activity  Psychomotor Activity:No data recorded  Assets  Assets:Communication Skills; Desire for Improvement; Financial Resources/Insurance; Housing; Physical Health   Sleep  Sleep:No data recorded  Physical Exam: Physical Exam Psychiatric:        Attention and Perception: Attention and perception normal.        Mood and Affect: Affect is flat.        Speech: Speech normal.        Behavior:  Behavior normal. Behavior is cooperative.        Thought Content: Thought content normal.        Cognition and Memory: Cognition and memory normal.        Judgment: Judgment normal.    Review of Systems  Psychiatric/Behavioral: Positive for substance abuse.  All other systems reviewed and are negative.  Blood pressure 93/63, pulse 85, temperature 98 F (36.7 C), temperature source Oral, resp. rate 16, height 5\' 2"  (1.575 m), weight 68 kg, SpO2 95 %. Body mass index is 27.42 kg/m.  Treatment Plan Summary: Plan discharge patient with shelter and substance abuse resources for individual follow-up.   Disposition: No evidence of imminent risk to self or others at present.   Patient does not meet criteria for psychiatric inpatient admission. Supportive therapy provided about ongoing stressors. Discussed crisis plan, support from social network, calling 911, coming to the Emergency Department, and calling Suicide Hotline. Patient provided shelter and substance abuse resources. Patient is denying any active suicidal or homicidal ideations and is not actively psychotic or intoxicated.   , NP 08/30/2020 12:44 PM

## 2020-08-30 NOTE — Discharge Instructions (Signed)
To help you maintain a sober lifestyle, a substance abuse treatment program may be beneficial to you.  Contact one of the following providers at your earliest opportunity to ask about enrolling:  RESIDENTIAL PROGRAMS:       [name]      [address]      [place], [place] [address]      [phone]       [facility]      [address] [place], [place] [address]      [phone]       [facility]      [address]      [place], [place] [address]      [phone]  CHEMICAL DEPENDENCY INTENSIVE OUTPATIENT PROGRAMS:       [facility]      [address] [employer]      [place], [place] [address]      [phone]       [facility]      [address] [place], [place] [address]      [phone]  For your shelter needs, contact the following service providers:       [facility] [employer]      [address] [place], [place] [address]      [phone]       [facility]      [address]      [place], [place] [address]      [phone]  For day shelter and other supportive services for the homeless, contact the [facility]):       [facility]      [address]      [place], [place] [address]      [phone]  For transitional housing, Pension scheme manager.  They provide longer term housing than a shelter, but there is an application process:       [facility] of [employer] [facility] [place]      [address], [place] [address]      [phone]

## 2020-08-30 NOTE — ED Notes (Signed)
Pt has sitter at bedside 

## 2020-09-17 ENCOUNTER — Ambulatory Visit: Payer: 59 | Admitting: Physician Assistant

## 2020-09-17 ENCOUNTER — Other Ambulatory Visit: Payer: Self-pay

## 2020-09-17 VITALS — BP 110/70 | HR 100 | Temp 98.2°F | Resp 18 | Ht 62.0 in | Wt 147.0 lb

## 2020-09-17 DIAGNOSIS — E039 Hypothyroidism, unspecified: Secondary | ICD-10-CM | POA: Insufficient documentation

## 2020-09-17 DIAGNOSIS — F332 Major depressive disorder, recurrent severe without psychotic features: Secondary | ICD-10-CM

## 2020-09-17 DIAGNOSIS — K219 Gastro-esophageal reflux disease without esophagitis: Secondary | ICD-10-CM | POA: Insufficient documentation

## 2020-09-17 DIAGNOSIS — F1994 Other psychoactive substance use, unspecified with psychoactive substance-induced mood disorder: Secondary | ICD-10-CM | POA: Diagnosis not present

## 2020-09-17 DIAGNOSIS — F1020 Alcohol dependence, uncomplicated: Secondary | ICD-10-CM

## 2020-09-17 DIAGNOSIS — Z13220 Encounter for screening for lipoid disorders: Secondary | ICD-10-CM

## 2020-09-17 MED ORDER — ARIPIPRAZOLE 10 MG PO TABS
5.0000 mg | ORAL_TABLET | Freq: Every day | ORAL | 1 refills | Status: DC
Start: 2020-09-17 — End: 2020-09-29
  Filled 2020-09-17: qty 30, 60d supply, fill #0

## 2020-09-17 MED ORDER — ESCITALOPRAM OXALATE 10 MG PO TABS
20.0000 mg | ORAL_TABLET | Freq: Every day | ORAL | 1 refills | Status: DC
  Filled 2020-09-17: qty 30, 15d supply, fill #0

## 2020-09-17 MED ORDER — TRAZODONE HCL 100 MG PO TABS
100.0000 mg | ORAL_TABLET | Freq: Every evening | ORAL | 1 refills | Status: DC | PRN
  Filled 2020-09-17: qty 30, 30d supply, fill #0

## 2020-09-17 MED ORDER — LEVOTHYROXINE SODIUM 25 MCG PO TABS
25.0000 ug | ORAL_TABLET | Freq: Every day | ORAL | 1 refills | Status: DC
  Filled 2020-09-17: qty 30, 30d supply, fill #0

## 2020-09-17 NOTE — Patient Instructions (Signed)
You are going to take an increased dose of Abilify 10 mg to help you with your intrusive thoughts.  Continue taking the Lexapro and the trazodone.  Please make sure to have your fasting labs completed on Monday morning, we will call you with those lab results.  I started a referral for you to be seen by our counselor to help you with obtaining therapy services.  They will contact you to set up an appointment.  Please return to the mobile unit in 2 weeks for follow-up of increased dose of Abilify.  Roney Jaffe, PA-C Physician Assistant [facility] [email or web address]    Suicidal Feelings: How to Help Yourself Suicide is when you end your own life. There are many things you can do to help yourself feel better when struggling with these feelings. Many services and people are available to support you and others who struggle with similar feelings.  If you ever feel like you may hurt yourself or others, or have thoughts about taking your own life, get help right away. To get help:  Call your local emergency services (911 in the [place]  The [place] [address]'s health and human services helpline (211 in the [place]  Go to your nearest emergency department.  Call a suicide hotline to speak with a trained counselor. The following suicide hotlines are available in the [place]: ? 1-800-273-TALK 807 873 0965). ? 1-800-SUICIDE 475-064-7303). ? 8043710258. This is a hotline for Spanish speakers. ? 551 686 1069. This is a hotline for TTY users. ? 1-866-4-U-TREVOR 386 594 0626). This is a hotline for lesbian, gay, bisexual, transgender, or questioning youth. ? For a list of hotlines in [place], visit ItCheaper.dk.html  Contact a crisis center or a local suicide prevention center. To find a crisis center or suicide prevention center: ? Call your local hospital, clinic, community  service organization, mental health center, social service provider, or health department. Ask for help with connecting to a crisis center. ? For a list of crisis centers in the [place], visit: suicidepreventionlifeline.org ? For a list of crisis centers in [place], visit: suicideprevention.ca How to help yourself feel better  Promise yourself that you will not do anything extreme when you have suicidal feelings. Remember the times you have felt hopeful. Many people have gotten through suicidal thoughts and feelings, and you can too. If you have had these feelings before, remind yourself that you can get through them again.  Let family, friends, teachers, or counselors know how you are feeling. Try not to separate yourself from those who care about you and want to help you. Talk with someone every day, even if you do not feel sociable. Face-to-face conversation is best to help them understand your feelings.  Contact a mental health care provider and work with this person regularly.  Make a safety plan that you can follow during a crisis. Include phone numbers of suicide prevention hotlines, mental health professionals, and trusted friends and family members you can call during an emergency. Save these numbers on your phone.  If you are thinking of taking a lot of medicine, give your medicine to someone who can give it to you as prescribed. If you are on antidepressants and are concerned you will overdose, tell your health care provider so that he or she can give you safer medicines.  Try to stick to your routines and follow a schedule every day. Make self-care a priority.  Make a list of realistic goals, and cross them off when you achieve them. Accomplishments can  give you a sense of worth.  Wait until you are feeling better before doing things that you find difficult or unpleasant.  Do things that you have always enjoyed to take your mind off your feelings. Try reading a book, or  listening to or playing music. Spending time outside, in nature, may help you feel better.   Follow these instructions at home:  Visit your primary health care provider every year for a checkup.  Work with a mental health care provider as needed.  Eat a well-balanced diet, and eat regular meals.  Get plenty of rest.  Exercise if you are able. Just 30 minutes of exercise each day can help you feel better.  Take over-the-counter and prescription medicines only as told by your health care provider. Ask your mental health care provider about the possible side effects of any medicines you are taking.  Do not use alcohol or drugs, and remove these substances from your home.  Remove weapons, poisons, knives, and other deadly items from your home.   General recommendations  Keep your living space well lit.  When you are feeling well, write yourself a letter with tips and support that you can read when you are not feeling well.  Remember that life's difficulties can be sorted out with help. Conditions can be treated, and you can learn behaviors and ways of thinking that will help you. Where to find more information  National Suicide Prevention Lifeline: www.suicidepreventionlifeline.org  Hopeline: www.hopeline.com  [employer] for Suicide Prevention: https://www.ayers.com/  [employer] (for lesbian, gay, bisexual, transgender, or questioning youth): www.thetrevorproject.[employer] of Mental Health: http://www.wall.info/ Contact a health care provider if:  You feel as though you are a burden to others.  You feel agitated, angry, vengeful, or have extreme mood swings.  You have withdrawn from family and friends. Get help right away if:  You are talking about suicide or wishing to die.  You start making plans for how to commit suicide.  You feel that you have no reason to live.  You start making plans for putting your  affairs in order, saying goodbye, or giving your possessions away.  You feel guilt, shame, or unbearable pain, and it seems like there is no way out.  You are frequently using drugs or alcohol.  You are engaging in risky behaviors that could lead to death. If you have any of these symptoms, get help right away. Call emergency services, go to your nearest emergency department or crisis center, or call a suicide crisis helpline. Summary  Suicide is when you take your own life.  Promise yourself that you will not do anything extreme when you have suicidal feelings.  Let family, friends, teachers, or counselors know how you are feeling.  Get help right away if you start making plans for how to commit suicide. This information is not intended to replace advice given to you by your health care provider. Make sure you discuss any questions you have with your health care provider. Document Revised: 12/22/2019 Document Reviewed: 12/22/2019 Elsevier Patient Education  2021 [employer].

## 2020-09-17 NOTE — Progress Notes (Signed)
New Patient Office Visit  Subjective:  Patient ID: Ian Munoz, male    DOB: May 08, 1980  Age: 40 y.o. MRN: [id number]  CC:  Chief Complaint  Patient presents with  . Hospitalization Follow-up    HPI Ian Munoz reports that he was seen inthe ED on 08/29/20 after having SI  Hospital note    11:59 PM Patient presents with alcohol abuse and suicidal ideation.  When I came to the room, patient was ripping off his monitor leads and was reporting he was leaving.  Patient reports he is still suicidal and wants to harm himself.  I then informed patient that since he is a threat to himself I will have to place him under involuntary commitment.  Patient then attempted to leave the emergency department and was guided back to the room with security.  Patient became extremely violent and threatening, and required four-point restraints and medications. Patient is now resting comfortably and vital signs are appropriate on the monitor.  Labs have been reviewed  The patient has been placed in psychiatric observation due to the need to provide a safe environment for the patient while obtaining psychiatric consultation and evaluation, as well as ongoing medical and medication management to treat the patient's condition. The patient has been placed under full IVC at this time.  Psych consult note:  Subjective:   Ian Munoz is a 40 y.o. male patient admitted with alcohol intoxication and suicidal ideation after calling 911 and threatening to harm himself following self report of drinking large quantities of beer.  On assessment patient presents lying in bed states he's "feeling better" says he was living in "sober house" [facility]) and can't return for 14 days due to recent intoxication. Patient denies any active suicidal or homicidal ideations, auditory or visual hallucinations, and does not appear to be responding to any external/internal stimuli at this time. Patient is requesting  assistance with housing resources and alcohol treatment.   HPI:   Ian Munoz is a 40 year old male admitted to [facility] with alcohol intoxication and suicidal ideation. Patient has past psychiatric history of substance induced mood disorder, alcohol use disorder, severe, dependence, and depression. He is currently working at [employer] and was recently living in an [employer].    Past Psychiatric History:              -substance induced mood disorder             -alcohol use disorder, severe, dependence  Risk to Self:  pt denies Risk to Others:  pt denies Prior Inpatient Therapy:  yes Prior Outpatient Therapy:  yes   Treatment Plan Summary: Plan discharge patient with shelter and substance abuse resources for individual follow-up.   Disposition: No evidence of imminent risk to self or others at present.   Patient does not meet criteria for psychiatric inpatient admission. Supportive therapy provided about ongoing stressors. Discussed crisis plan, support from social network, calling 911, coming to the Emergency Department, and calling Suicide Hotline. Patient provided shelter and substance abuse resources. Patient is denying any active suicidal or homicidal ideations and is not actively psychotic or intoxicated.     States today that he has been experiencing homelessness for the past two weeks, but has found a place to live starting tonight States that he does have help locating housing  States that he does not feel that the lexapro is helping with his mood, states that he still has thoughts that would be  better off dead.  Adamantly denies any thoughts of self-harm or any active plan of suicide or homicide.  Does states that he has been taking the Abilify as directed.  Reports he has not been using the trazodone due to concerns of sedation wall having to sleep outside  Does not have health insurance    Past Medical History:  Diagnosis Date  . Depression   . Prostate  enlargement     Past Surgical History:  Procedure Laterality Date  . EYE SURGERY    . WRIST SURGERY Right     Family History  Family history unknown: Yes    Social History   Socioeconomic History  . Marital status: Single    Spouse name: Not on file  . Number of children: Not on file  . Years of education: Not on file  . Highest education level: Not on file  Occupational History  . Not on file  Tobacco Use  . Smoking status: Never Smoker  . Smokeless tobacco: Current User    Types: Chew  Substance and Sexual Activity  . Alcohol use: No  . Drug use: No  . Sexual activity: Yes    Birth control/protection: None  Other Topics Concern  . Not on file  Social History Narrative  . Not on file   Social Determinants of Health   Financial Resource Strain: Not on file  Food Insecurity: Not on file  Transportation Needs: Not on file  Physical Activity: Not on file  Stress: Not on file  Social Connections: Not on file  Intimate Partner Violence: Not on file    ROS Review of Systems  Constitutional: Negative.   HENT: Negative.   Eyes: Negative.   Respiratory: Negative for shortness of breath.   Cardiovascular: Negative for chest pain.  Endocrine: Negative.   Genitourinary: Negative.   Musculoskeletal: Negative.   Allergic/Immunologic: Negative.   Neurological: Negative for headaches.  Psychiatric/Behavioral: Positive for dysphoric mood and sleep disturbance. Negative for self-injury and suicidal ideas. The patient is not nervous/anxious.     Objective:   Today's Vitals: BP 110/70 (BP Location: Left Arm, Patient Position: Sitting, Cuff Size: Normal)   Pulse 100   Temp 98.2 F (36.8 C) (Oral)   Resp 18   Ht  (1.575 m)   Wt 147 lb (66.7 kg)   SpO2 100%   BMI 26.89 kg/m   Physical Exam Vitals and nursing note reviewed.  Constitutional:      Appearance: Normal appearance.  HENT:     Head: Normocephalic and atraumatic.     Right Ear: External ear  normal.     Left Ear: External ear normal.     Nose: Nose normal.     Mouth/Throat:     Mouth: Mucous membranes are moist.     Pharynx: Oropharynx is clear.  Eyes:     Extraocular Movements: Extraocular movements intact.     Conjunctiva/sclera: Conjunctivae normal.     Pupils: Pupils are equal, round, and reactive to light.  Cardiovascular:     Rate and Rhythm: Normal rate and regular rhythm.     Pulses: Normal pulses.     Heart sounds: Normal heart sounds.  Pulmonary:     Effort: Pulmonary effort is normal.     Breath sounds: Normal breath sounds.  Musculoskeletal:        General: Normal range of motion.     Cervical back: Normal range of motion and neck supple.  Skin:    General: Skin  is warm and dry.  Neurological:     General: No focal deficit present.     Mental Status: He is alert and oriented to person, place, and time.  Psychiatric:        Attention and Perception: Attention and perception normal.        Mood and Affect: Mood and affect normal.        Speech: Speech normal.        Behavior: Behavior normal.        Thought Content: Thought content normal. Thought content does not include homicidal or suicidal plan.        Cognition and Memory: Cognition and memory normal.        Judgment: Judgment normal.     Assessment & Plan:   Problem List Items Addressed This Visit      Digestive   Gastroesophageal reflux disease without esophagitis     Endocrine   Hypothyroidism   Relevant Medications   levothyroxine (SYNTHROID) 25 MCG tablet   Other Relevant Orders   Thyroid Panel With TSH     Nervous and Auditory   Substance induced mood disorder (HCC)   Relevant Medications   ARIPiprazole (ABILIFY) 10 MG tablet   escitalopram (LEXAPRO) 10 MG tablet   traZODone (DESYREL) 100 MG tablet   Other Relevant Orders   Ambulatory referral to Social Work   Vitamin D, 25-hydroxy     Other   Severe recurrent major depression without psychotic features (HCC) - Primary    Relevant Medications   escitalopram (LEXAPRO) 10 MG tablet   traZODone (DESYREL) 100 MG tablet   Other Relevant Orders   CBC with Differential/Platelet   Comp. Metabolic Panel (12)   Alcohol use disorder, severe, dependence (HCC)    Other Visit Diagnoses    Screening for lipid disorders       Relevant Orders   Lipid panel      Outpatient Encounter Medications as of 09/17/2020  Medication Sig  . diphenhydrAMINE (BENADRYL) 25 MG tablet Take 1 tablet (25 mg total) by mouth every 6 (six) hours as needed for itching.  . famotidine (PEPCID) 20 MG tablet Take 1 tablet (20 mg total) by mouth 2 (two) times daily.  . [DISCONTINUED] ARIPiprazole (ABILIFY) 5 MG tablet Take 1 tablet (5 mg total) by mouth daily.  . [DISCONTINUED] escitalopram (LEXAPRO) 10 MG tablet Take 2 tablets (20 mg total) by mouth daily.  . [DISCONTINUED] levothyroxine (SYNTHROID) 25 MCG tablet Take 1 tablet (25 mcg total) by mouth daily before breakfast.  . [DISCONTINUED] traZODone (DESYREL) 100 MG tablet Take 1 tablet (100 mg total) by mouth at bedtime as needed for sleep.  . ARIPiprazole (ABILIFY) 10 MG tablet Take 0.5 tablets (5 mg total) by mouth daily.  Marland Kitchen escitalopram (LEXAPRO) 10 MG tablet Take 2 tablets (20 mg total) by mouth daily.  Marland Kitchen levothyroxine (SYNTHROID) 25 MCG tablet Take 1 tablet (25 mcg total) by mouth daily before breakfast.  . traZODone (DESYREL) 100 MG tablet Take 1 tablet (100 mg total) by mouth at bedtime as needed for sleep.  . [DISCONTINUED] predniSONE (DELTASONE) 20 MG tablet 3 tabs po day one, then 2 po daily x 4 days   No facility-administered encounter medications on file as of 09/17/2020.   1. Severe recurrent major depression without psychotic features (HCC) Continue Lexapro, increase Abilify 10 mg, continue trazodone as needed.  Patient was given financial assistance application, patient was given appointment to establish care with Dr. Delford Field at [facility] on November 18, 2020.  Patient was given an appointment for fasting labs.  Patient to return to mobile unit in 2 weeks for behavioral health medication management.  Patient agreeable for counseling referral.  Red flags given for prompt reevaluation - CBC with Differential/Platelet; Future - Comp. Metabolic Panel (12); Future  2. Substance induced mood disorder (HCC)  - Ambulatory referral to Social Work - Vitamin D, 25-hydroxy; Future - ARIPiprazole (ABILIFY) 10 MG tablet; Take 0.5 tablets (5 mg total) by mouth daily.  Dispense: 30 tablet; Refill: 1 - escitalopram (LEXAPRO) 10 MG tablet; Take 2 tablets (20 mg total) by mouth daily.  Dispense: 30 tablet; Refill: 1 - traZODone (DESYREL) 100 MG tablet; Take 1 tablet (100 mg total) by mouth at bedtime as needed for sleep.  Dispense: 30 tablet; Refill: 1  3. Alcohol use disorder, severe, dependence (HCC)   4. Hypothyroidism, unspecified type Continue current regimen - Thyroid Panel With TSH; Future - levothyroxine (SYNTHROID) 25 MCG tablet; Take 1 tablet (25 mcg total) by mouth daily before breakfast.  Dispense: 30 tablet; Refill: 1  5. Screening for lipid disorders  - Lipid panel; Future   I have reviewed the patient's medical history (PMH, PSH, Social History, Family History, Medications, and allergies) , and have been updated if relevant. I spent 36 minutes reviewing chart and  face to face time with patient.      Follow-up: Return in about 2 weeks (around 10/01/2020).   Kasandra Knudsen Mayers, PA-C

## 2020-09-17 NOTE — Progress Notes (Signed)
Patient presents for ED FU regarding suicidal ideations and homicidal ideations. Patient has Patient has Patient denies/complains

## 2020-09-18 ENCOUNTER — Encounter (HOSPITAL_COMMUNITY): Payer: Self-pay

## 2020-09-18 ENCOUNTER — Emergency Department (HOSPITAL_COMMUNITY)
Admission: EM | Admit: 2020-09-18 | Discharge: 2020-09-18 | Disposition: A | Discharge status: Home / Self Care | Payer: 59 | Attending: Emergency Medicine | Admitting: Emergency Medicine

## 2020-09-18 ENCOUNTER — Other Ambulatory Visit: Payer: Self-pay

## 2020-09-18 DIAGNOSIS — F17220 Nicotine dependence, chewing tobacco, uncomplicated: Secondary | ICD-10-CM | POA: Diagnosis not present

## 2020-09-18 DIAGNOSIS — E039 Hypothyroidism, unspecified: Secondary | ICD-10-CM | POA: Insufficient documentation

## 2020-09-18 DIAGNOSIS — Y907 Blood alcohol level of 200-239 mg/100 ml: Secondary | ICD-10-CM | POA: Insufficient documentation

## 2020-09-18 DIAGNOSIS — Z20822 Contact with and (suspected) exposure to covid-19: Secondary | ICD-10-CM | POA: Insufficient documentation

## 2020-09-18 DIAGNOSIS — F10129 Alcohol abuse with intoxication, unspecified: Secondary | ICD-10-CM | POA: Diagnosis not present

## 2020-09-18 DIAGNOSIS — Z79899 Other long term (current) drug therapy: Secondary | ICD-10-CM | POA: Insufficient documentation

## 2020-09-18 DIAGNOSIS — R4182 Altered mental status, unspecified: Secondary | ICD-10-CM | POA: Diagnosis present

## 2020-09-18 DIAGNOSIS — F10929 Alcohol use, unspecified with intoxication, unspecified: Secondary | ICD-10-CM

## 2020-09-18 LAB — CBC WITH DIFFERENTIAL/PLATELET
Abs Immature Granulocytes: 0.01 10*3/uL (ref 0.00–0.07)
Basophils Absolute: 0 10*3/uL (ref 0.0–0.1)
Basophils Relative: 0 %
Eosinophils Absolute: 0 10*3/uL (ref 0.0–0.5)
Eosinophils Relative: 1 %
HCT: 36.6 % — ABNORMAL LOW (ref 39.0–52.0)
Hemoglobin: 12.4 g/dL — ABNORMAL LOW (ref 13.0–17.0)
Immature Granulocytes: 0 %
Lymphocytes Relative: 43 %
Lymphs Abs: 2.1 10*3/uL (ref 0.7–4.0)
MCH: 35.6 pg — ABNORMAL HIGH (ref 26.0–34.0)
MCHC: 33.9 g/dL (ref 30.0–36.0)
MCV: 105.2 fL — ABNORMAL HIGH (ref 80.0–100.0)
Monocytes Absolute: 0.3 10*3/uL (ref 0.1–1.0)
Monocytes Relative: 6 %
Neutro Abs: 2.4 10*3/uL (ref 1.7–7.7)
Neutrophils Relative %: 50 %
Platelets: 231 10*3/uL (ref 150–400)
RBC: 3.48 MIL/uL — ABNORMAL LOW (ref 4.22–5.81)
RDW: 12.9 % (ref 11.5–15.5)
WBC: 4.8 10*3/uL (ref 4.0–10.5)
nRBC: 0 % (ref 0.0–0.2)

## 2020-09-18 LAB — COMPREHENSIVE METABOLIC PANEL
ALT: 32 U/L (ref 0–44)
AST: 21 U/L (ref 15–41)
Albumin: 3.2 g/dL — ABNORMAL LOW (ref 3.5–5.0)
Alkaline Phosphatase: 82 U/L (ref 38–126)
Anion gap: 8 (ref 5–15)
BUN: 9 mg/dL (ref 6–20)
CO2: 22 mmol/L (ref 22–32)
Calcium: 7.6 mg/dL — ABNORMAL LOW (ref 8.9–10.3)
Chloride: 111 mmol/L (ref 98–111)
Creatinine, Ser: 0.81 mg/dL (ref 0.61–1.24)
GFR, Estimated: >60 mL/min (ref >60)
Glucose, Bld: 97 mg/dL (ref 70–99)
Potassium: 3.9 mmol/L (ref 3.5–5.1)
Sodium: 141 mmol/L (ref 135–145)
Total Bilirubin: 0.5 mg/dL (ref 0.3–1.2)
Total Protein: 5.6 g/dL — ABNORMAL LOW (ref 6.5–8.1)

## 2020-09-18 LAB — URINALYSIS, ROUTINE W REFLEX MICROSCOPIC
Bilirubin Urine: NEGATIVE
Glucose, UA: NEGATIVE mg/dL
Hgb urine dipstick: NEGATIVE
Ketones, ur: NEGATIVE mg/dL
Leukocytes,Ua: NEGATIVE
Nitrite: NEGATIVE
Protein, ur: NEGATIVE mg/dL
Specific Gravity, Urine: 1.003 — ABNORMAL LOW (ref 1.005–1.030)
pH: 5 (ref 5.0–8.0)

## 2020-09-18 LAB — RESP PANEL BY RT-PCR (FLU A&B, COVID) ARPGX2
Influenza A by PCR: NEGATIVE
Influenza B by PCR: NEGATIVE
SARS Coronavirus 2 by RT PCR: NEGATIVE

## 2020-09-18 LAB — RAPID URINE DRUG SCREEN, HOSP PERFORMED
Amphetamines: NOT DETECTED
Barbiturates: NOT DETECTED
Benzodiazepines: NOT DETECTED
Cocaine: NOT DETECTED
Opiates: NOT DETECTED
Tetrahydrocannabinol: NOT DETECTED

## 2020-09-18 LAB — ETHANOL: Alcohol, Ethyl (B): 211 mg/dL — ABNORMAL HIGH (ref <10)

## 2020-09-18 LAB — SALICYLATE LEVEL: Salicylate Lvl: <7 mg/dL — ABNORMAL LOW (ref 7.0–30.0)

## 2020-09-18 LAB — ACETAMINOPHEN LEVEL: Acetaminophen (Tylenol), Serum: <10 ug/mL — ABNORMAL LOW (ref 10–30)

## 2020-09-18 MED ORDER — NALOXONE HCL 2 MG/2ML IJ SOSY: 2.0000 mg | PREFILLED_SYRINGE | Freq: Once | INTRAMUSCULAR | Status: AC

## 2020-09-18 MED ORDER — NALOXONE HCL 2 MG/2ML IJ SOSY
PREFILLED_SYRINGE | INTRAMUSCULAR | Status: AC
  Administered 2020-09-18: 2 mg via INTRAVENOUS
  Filled 2020-09-18: qty 2

## 2020-09-18 NOTE — ED Triage Notes (Signed)
Pt BIB [facility] after being found unresponsive on the side of the road. EMS admin 2mg  of Narcan without response. Pt had 1.5L NaCl prior to arrival. C-collar also in place.   Pt currently with sonorous resp, responsive to noxious stimuli. EDP at bedside.

## 2020-09-18 NOTE — ED Provider Notes (Signed)
[facility] EMERGENCY DEPARTMENT Provider Note   CSN: [id number] Arrival date & time: 09/18/20  0033     History Chief Complaint  Patient presents with  . Altered Mental Status    Ian Munoz is a 40 y.o. male.  40 yo M with a chief complaints of unresponsiveness.  Found by EMS with pinpoint pupils and slow respirations.  Placed in a c-collar to help him keep from flexing at the neck.  Not tolerating an NPA in the field.  Given 2 L of IV fluids with some improvement of his blood pressure.  Painful response to sternal rub quickly back to sleep per EMS.  Patient tells [name] that he drank heavily but denies any other coingestants.  Level 5 caveat acute intoxication.   Altered Mental Status      Past Medical History:  Diagnosis Date  . Depression   . Prostate enlargement     Patient Active Problem List   Diagnosis Date Noted  . Hypothyroidism 09/17/2020  . Gastroesophageal reflux disease without esophagitis 09/17/2020  . Alcohol use disorder, severe, dependence (HCC) 08/07/2020  . 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 Substance induced mood disorder (HCC)   . Severe recurrent major depression without psychotic features (HCC) 08/06/2019    Past Surgical History:  Procedure Laterality Date  . EYE SURGERY    . WRIST SURGERY Right        Family History  Family history unknown: Yes    Social History   Tobacco Use  . Smoking status: Never Smoker  . Smokeless tobacco: Current User    Types: Chew  Substance Use Topics  . Alcohol use: No  . Drug use: No    Home Medications Prior to Admission medications   Medication Sig Start Date End Date Taking? Authorizing Provider  ARIPiprazole (ABILIFY) 10 MG tablet Take 0.5 tablets (5 mg total) by mouth daily. 09/17/20   Mayers, Cari S, PA-C  diphenhydrAMINE (BENADRYL) 25 MG tablet Take 1 tablet (25 mg total) by mouth every 6 (six) hours as needed for itching. 08/25/20   Fayrene Helper, PA-C  escitalopram  (LEXAPRO) 10 MG tablet Take 2 tablets (20 mg total) by mouth daily. 09/17/20   Mayers, Cari S, PA-C  famotidine (PEPCID) 20 MG tablet Take 1 tablet (20 mg total) by mouth 2 (two) times daily. 08/25/20   Fayrene Helper, PA-C  levothyroxine (SYNTHROID) 25 MCG tablet Take 1 tablet (25 mcg total) by mouth daily before breakfast. 09/17/20   Mayers, Cari S, PA-C  traZODone (DESYREL) 100 MG tablet Take 1 tablet (100 mg total) by mouth at bedtime as needed for sleep. 09/17/20   Mayers, Cari S, PA-C    Allergies    Wellbutrin [bupropion]  Review of Systems   Review of Systems  Unable to perform ROS: Mental status change    Physical Exam Updated Vital Signs BP 104/72 (BP Location: Right Arm)   Pulse 71   Temp 98.2 F (36.8 C) (Oral)   Resp 14   Ht 5\' 2"  (1.575 m)   Wt 66 kg   SpO2 100%   BMI 26.61 kg/m   Physical Exam Vitals and nursing note reviewed.  Constitutional:      Appearance: He is well-developed.  HENT:     Head: Normocephalic and atraumatic.  Eyes:     Comments: Pinpoint pupils  Neck:     Vascular: No JVD.  Cardiovascular:     Rate and Rhythm: Normal rate and regular rhythm.  Heart sounds: No murmur heard. No friction rub. No gallop.   Pulmonary:     Effort: No respiratory distress.     Breath sounds: No wheezing.  Abdominal:     General: There is no distension.     Tenderness: There is no guarding or rebound.  Musculoskeletal:        General: Normal range of motion.     Cervical back: Normal range of motion and neck supple.  Skin:    Coloration: Skin is not pale.     Findings: No rash.  Neurological:     Mental Status: He is alert.     Comments: Sleeping, awakes to painful stimuli.  Yells angry things at people when he wakes up.  Psychiatric:        Behavior: Behavior normal.     ED Results / Procedures / Treatments   Labs (all labs ordered are listed, but only abnormal results are displayed) Labs Reviewed  COMPREHENSIVE METABOLIC PANEL - Abnormal;  Notable for the following components:      Result Value   Calcium 7.6 (*)    Total Protein 5.6 (*)    Albumin 3.2 (*)    All other components within normal limits  ETHANOL - Abnormal; Notable for the following components:   Alcohol, Ethyl (B) 211 (*)    All other components within normal limits  CBC WITH DIFFERENTIAL/PLATELET - Abnormal; Notable for the following components:   RBC 3.48 (*)    Hemoglobin 12.4 (*)    HCT 36.6 (*)    MCV 105.2 (*)    MCH 35.6 (*)    All other components within normal limits  SALICYLATE LEVEL - Abnormal; Notable for the following components:   Salicylate Lvl <7.0 (*)    All other components within normal limits  ACETAMINOPHEN LEVEL - Abnormal; Notable for the following components:   Acetaminophen (Tylenol), Serum <10 (*)    All other components within normal limits  URINALYSIS, ROUTINE W REFLEX MICROSCOPIC - Abnormal; Notable for the following components:   Color, Urine COLORLESS (*)    Specific Gravity, Urine 1.003 (*)    All other components within normal limits  RESP PANEL BY RT-PCR (FLU A&B, COVID) ARPGX2  RAPID URINE DRUG SCREEN, HOSP PERFORMED    EKG EKG Interpretation  Date/Time:  Wednesday September 18 2020 05:08:34 EDT Ventricular Rate:  75 PR Interval:  152 QRS Duration: 91 QT Interval:  394 QTC Calculation: 441 R Axis:   67 Text Interpretation: Sinus rhythm No significant change since last tracing Confirmed by Melene Plan [phone]) on 09/18/2020 6:22:35 AM   Radiology No results found.  Procedures Procedures   Medications Ordered in ED Medications  naloxone (NARCAN) injection 2 mg (2 mg Intravenous Given 09/18/20 0046)    ED Course  I have reviewed the triage vital signs and the nursing notes.  Pertinent labs & imaging results that were available during my care of the patient were reviewed by me and considered in my medical decision making (see chart for details).    MDM Rules/Calculators/A&P                          40 yo M  with a chief complaints of unresponsiveness.  Patient tells [name] that he drank heavily tonight.  Has been seen multiple times in the ED for suicidal ideation recently.  Will obtain a laboratory evaluation Tylenol salicylate levels observe.  Lab work largely unremarkable.  Patient's alcohol level  is not significantly elevated for him.  211.  UDS is negative though I suspect he likely had a coingestants based on his presentation.  Patient was reassessed and now easily arousable tells me he would like to go home.  No SI or HI.  6:27 AM:  I have discussed the diagnosis/risks/treatment options with the patient and believe the pt to be eligible for discharge home to follow-up with PCP. We also discussed returning to the ED immediately if new or worsening sx occur. We discussed the sx which are most concerning (e.g., sudden worsening pain, fever, inability to tolerate by mouth) that necessitate immediate return. Medications administered to the patient during their visit and any new prescriptions provided to the patient are listed below.  Medications given during this visit Medications  naloxone (NARCAN) injection 2 mg (2 mg Intravenous Given 09/18/20 0046)     The patient appears reasonably screen and/or stabilized for discharge and I doubt any other medical condition or other [facility] requiring further screening, evaluation, or treatment in the ED at this time prior to discharge.   Final Clinical Impression(s) / ED Diagnoses Final diagnoses:  Alcoholic intoxication with complication [facility])    Rx / DC Orders ED Discharge Orders    None       Melene Plan, DO 09/18/20 [phone]

## 2020-09-19 ENCOUNTER — Other Ambulatory Visit: Payer: Self-pay

## 2020-09-21 ENCOUNTER — Ambulatory Visit (HOSPITAL_COMMUNITY)
Admission: EM | Admit: 2020-09-21 | Discharge: 2020-09-22 | Disposition: A | Discharge status: Home / Self Care | Payer: No Payment, Other | Attending: Urology | Admitting: Urology

## 2020-09-21 ENCOUNTER — Other Ambulatory Visit: Payer: Self-pay

## 2020-09-21 DIAGNOSIS — Z20822 Contact with and (suspected) exposure to covid-19: Secondary | ICD-10-CM | POA: Insufficient documentation

## 2020-09-21 DIAGNOSIS — F333 Major depressive disorder, recurrent, severe with psychotic symptoms: Secondary | ICD-10-CM | POA: Insufficient documentation

## 2020-09-21 DIAGNOSIS — R45850 Homicidal ideations: Secondary | ICD-10-CM | POA: Insufficient documentation

## 2020-09-21 DIAGNOSIS — F419 Anxiety disorder, unspecified: Secondary | ICD-10-CM | POA: Insufficient documentation

## 2020-09-21 DIAGNOSIS — F1994 Other psychoactive substance use, unspecified with psychoactive substance-induced mood disorder: Secondary | ICD-10-CM | POA: Insufficient documentation

## 2020-09-21 DIAGNOSIS — R45851 Suicidal ideations: Secondary | ICD-10-CM | POA: Insufficient documentation

## 2020-09-21 DIAGNOSIS — F1010 Alcohol abuse, uncomplicated: Secondary | ICD-10-CM | POA: Insufficient documentation

## 2020-09-21 DIAGNOSIS — F10129 Alcohol abuse with intoxication, unspecified: Secondary | ICD-10-CM | POA: Insufficient documentation

## 2020-09-21 DIAGNOSIS — Z79899 Other long term (current) drug therapy: Secondary | ICD-10-CM | POA: Insufficient documentation

## 2020-09-21 LAB — POCT URINE DRUG SCREEN - MANUAL ENTRY (I-SCREEN)
POC Amphetamine UR: NOT DETECTED
POC Buprenorphine (BUP): NOT DETECTED
POC Cocaine UR: NOT DETECTED
POC Marijuana UR: NOT DETECTED
POC Methadone UR: NOT DETECTED
POC Methamphetamine UR: NOT DETECTED
POC Morphine: NOT DETECTED
POC Oxazepam (BZO): NOT DETECTED
POC Oxycodone UR: NOT DETECTED
POC Secobarbital (BAR): NOT DETECTED

## 2020-09-21 LAB — POC SARS CORONAVIRUS 2 AG: SARSCOV2ONAVIRUS 2 AG: NEGATIVE

## 2020-09-21 MED ORDER — THIAMINE HCL 100 MG/ML IJ SOLN
100.0000 mg | Freq: Once | INTRAMUSCULAR | Status: AC
  Administered 2020-09-21: 100 mg via INTRAMUSCULAR
  Filled 2020-09-21: qty 2

## 2020-09-21 MED ORDER — ARIPIPRAZOLE 5 MG PO TABS
5.0000 mg | ORAL_TABLET | Freq: Every day | ORAL | Status: DC
  Administered 2020-09-22: 5 mg via ORAL
  Filled 2020-09-21: qty 1

## 2020-09-21 MED ORDER — HYDROXYZINE HCL 25 MG PO TABS: 25.0000 mg | ORAL_TABLET | Freq: Four times a day (QID) | ORAL | Status: DC | PRN

## 2020-09-21 MED ORDER — ALUM & MAG HYDROXIDE-SIMETH 200-200-20 MG/5ML PO SUSP: 30.0000 mL | ORAL | Status: DC | PRN

## 2020-09-21 MED ORDER — THIAMINE HCL 100 MG PO TABS
100.0000 mg | ORAL_TABLET | Freq: Every day | ORAL | Status: DC
  Administered 2020-09-22: 100 mg via ORAL
  Filled 2020-09-21: qty 1

## 2020-09-21 MED ORDER — FAMOTIDINE 20 MG PO TABS
20.0000 mg | ORAL_TABLET | Freq: Two times a day (BID) | ORAL | Status: DC
  Administered 2020-09-21 – 2020-09-22 (×2): 20 mg via ORAL
  Filled 2020-09-21 (×2): qty 1

## 2020-09-21 MED ORDER — MAGNESIUM HYDROXIDE 400 MG/5ML PO SUSP: 30.0000 mL | Freq: Every day | ORAL | Status: DC | PRN

## 2020-09-21 MED ORDER — ESCITALOPRAM OXALATE 10 MG PO TABS
20.0000 mg | ORAL_TABLET | Freq: Every day | ORAL | Status: DC
  Administered 2020-09-22: 20 mg via ORAL
  Filled 2020-09-21: qty 2

## 2020-09-21 MED ORDER — ONDANSETRON 4 MG PO TBDP: 4.0000 mg | ORAL_TABLET | Freq: Four times a day (QID) | ORAL | Status: DC | PRN

## 2020-09-21 MED ORDER — LEVOTHYROXINE SODIUM 25 MCG PO TABS
25.0000 ug | ORAL_TABLET | Freq: Every day | ORAL | Status: DC
  Administered 2020-09-22: 25 ug via ORAL
  Filled 2020-09-21: qty 1

## 2020-09-21 MED ORDER — LOPERAMIDE HCL 2 MG PO CAPS: 2.0000 mg | ORAL_CAPSULE | ORAL | Status: DC | PRN

## 2020-09-21 MED ORDER — ACETAMINOPHEN 325 MG PO TABS
650.0000 mg | ORAL_TABLET | Freq: Four times a day (QID) | ORAL | Status: DC | PRN
  Administered 2020-09-22: 650 mg via ORAL
  Filled 2020-09-21: qty 2

## 2020-09-21 MED ORDER — LORAZEPAM 1 MG PO TABS: 1.0000 mg | ORAL_TABLET | Freq: Four times a day (QID) | ORAL | Status: DC | PRN

## 2020-09-21 MED ORDER — TRAZODONE HCL 50 MG PO TABS
50.0000 mg | ORAL_TABLET | Freq: Every evening | ORAL | Status: DC | PRN
  Administered 2020-09-21: 50 mg via ORAL
  Filled 2020-09-21: qty 1

## 2020-09-21 MED ORDER — ADULT MULTIVITAMIN W/MINERALS CH
1.0000 | ORAL_TABLET | Freq: Every day | ORAL | Status: DC
  Administered 2020-09-21 – 2020-09-22 (×2): 1 via ORAL
  Filled 2020-09-21 (×2): qty 1
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
## 2020-09-21 NOTE — ED Notes (Signed)
Patient received for admission to [facility] adult wing for psychiatric evaluation following unresponsiveness on public street.  He is alert, oriented and stable.  Demonstrated ability to articulate needs and follows commands appropriately.  Patient admits to transient SI and excessive alcohol use.  Recent ED evaluation on 09/18/20 for alcoholic intoxication noted.  Skin assessment notable for tattoos to bilateral deltoids.  Skin tag to left upper shoulder region.  Bilateral feet with hardened, callous areas to plantar surfaces.  Patient placed in BHA04-01.  Meal consisting of salad and beverage provided. Patient is medication compliant.  Continent of bowel and bladder.  No further needs or concerns at this time.  Nursing will continue to monitor.

## 2020-09-22 LAB — RESP PANEL BY RT-PCR (FLU A&B, COVID) ARPGX2
Influenza A by PCR: NEGATIVE
Influenza B by PCR: NEGATIVE
SARS Coronavirus 2 by RT PCR: NEGATIVE

## 2020-09-22 LAB — ETHANOL: Alcohol, Ethyl (B): 157 mg/dL — ABNORMAL HIGH (ref <10)

## 2020-09-22 NOTE — ED Provider Notes (Signed)
Behavioral Health Admission H&P [facility] & OBS)  Date: 09/22/20 Patient Name: Ian Munoz MRN: [id number] Chief Complaint:  Chief Complaint  Patient presents with  . Addiction Problem   Chief Complaint/Presenting Problem: Pt is 40 yo adult presenting voluntarily to [facility] via [employer]. Pt was unaccompanied and is reporting symptoms of depression with suicidal ideation. Pt has a behavioral health history of depression and suicidal ideation and  was referred for assessment by himself. Pt felt that his depression symptoms were escalating and called 911. Pt's psychiatric history includes multiple ED visits for substance induced mood disorder, depression, suicidal ideation and homicidal ideation . IP history includes [facility], [address].  . Last inpatient admission was 2 months ago.     Pt reports current medication of Lexapro and Abilify and is not currently compliant with medications prescribed.  Pt reports current suicidal ideation with vague plans and no intent to follow though.  Pt reports no previous suicide attempts. Pt acknowledges multiple symptoms of depression including anhedonia, isolating, feelings of worthlessness & guilt, tearfulness, changes in sleep & appetite (10 lb weight loss), & increased irritability. Pt reports vague homicidal ideation and denies history of violence. Pt reports auditory (shadows) & visual hallucinations (command voices). Pt states current stressors include family conflict and vocational instability.      Pt lives alone with limited supports. Pt reports unstable childhood Pt's work history includes full time and part time work.  Legal history includes current and upcoming court cases.       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     Pt has normal appearance with sluggish motor activity. Pts speech patterns are slurred and affect is appropriate/depressed. Pt has poor judgement with impaired cognitive ability and  judgement. Pt is oriented x 5 with intact memory. Pt feels that he is currently a danger to himself at time of assessment.  Diagnoses:  Final diagnoses:  Substance induced mood disorder (HCC)  Alcohol abuse  Severe episode of recurrent major depressive disorder, with psychotic features (HCC)    HPI: Ian Munoz is a 40y/o male. Patient presented voluntarily to [facility] via [employer]. Patient report that he called law enforcement to bring him to [facility] due to worsening depression, suicidal ideation and alcohol detox. Patient endorses depressive symptoms of crying spells, guilt, loss of appetite, irritability, isolation, and worthlessness. He admits to noncompliance with his psychotropic medications (lexapro and Abilify) due to financial difficulties.   Patient report that he is unable to maintain safety due to ongoing passive suicidal ideation, patient is also endorsing passive homicidal ideation without specific individual target. Patient endorses auditory hallucination of hearing "voices," patient will not elaborate on what the voices are saying. He endorses visual hallucination of seeing shadows; he denies paranoia and no indication of delusional thought content noted. He reports drinking "a lot" of alcohol on a daily basis. He is admits to [employer] and marijuana abuse. He reports that he last used meth "last week" and used marijuana today. Patient reports that he currently works as a Production designer, theatre/television/film man at [employer] and lives in a "sober house."   Patient report that he would like assistance with housing, medication, and alcohol treatment.   PHQ 2-9:  Flowsheet Row ED from 09/21/2020 in [facility] Office Visit from 09/17/2020 in [facility] ED from 08/06/2020 in [facility] EMERGENCY DEPARTMENT  Thoughts that you would be better off dead, or of hurting yourself in  some way Nearly every day Several days More than half the days  PHQ-9  Total Score 27 20 12       Flowsheet Row ED from 09/21/2020 in [facility] ED from 08/29/2020 in [facility] ED from 08/21/2020 in [facility]  C-SSRS RISK CATEGORY Low Risk Moderate Risk No Risk       Total Time spent with patient: 20 minutes  Musculoskeletal  Strength & Muscle Tone: within normal limits Gait & Station: normal Patient leans: Right  Psychiatric Specialty Exam  Presentation General Appearance: Appropriate for Environment  Eye Contact:Good  Speech:Clear and Coherent  Speech Volume:Normal  Handedness:Right   Mood and Affect  Mood:Depressed  Affect:Congruent   Thought Process  Thought Processes:Coherent  Descriptions of Associations:Intact  Orientation:Full (Time, Place and Person)  Thought Content:WDL  Diagnosis of Schizophrenia or Schizoaffective disorder in past: No   Hallucinations:Hallucinations: None  Ideas of Reference:None  Suicidal Thoughts:Suicidal Thoughts: Yes, Passive SI Passive Intent and/or Plan: Without Plan  Homicidal Thoughts:Homicidal Thoughts: No   Sensorium  Memory:Immediate Good; Remote Good; Recent Good  Judgment:Intact  Insight:Good   Executive Functions  Concentration:Good  Attention Span:Good  Recall:Good  Fund of Knowledge:Good  Language:Good   Psychomotor Activity  Psychomotor Activity:Psychomotor Activity: Normal   Assets  Assets:Communication Skills; Desire for Improvement; Vocational/Educational; Physical Health   Sleep  Sleep:Sleep: Fair Number of Hours of Sleep: 6   Nutritional Assessment (For OBS and FBC admissions only) Has the patient had a decrease in food intake/or appetite?: No Does the patient have dental problems?: No Does the patient have eating habits or behaviors that may be indicators of an eating disorder including binging or inducing vomiting?: No Has the patient recently lost  weight without trying?: No Has the patient been eating poorly because of a decreased appetite?: No Malnutrition Screening Tool Score: 0    Physical Exam Vitals and nursing note reviewed.  Constitutional:      Appearance: He is well-developed.  HENT:     Head: Normocephalic and atraumatic.  Eyes:     Conjunctiva/sclera: Conjunctivae normal.  Cardiovascular:     Rate and Rhythm: Normal rate and regular rhythm.     Heart sounds: No murmur heard.   Pulmonary:     Effort: Pulmonary effort is normal. No respiratory distress.     Breath sounds: Normal breath sounds.  Abdominal:     Palpations: Abdomen is soft.     Tenderness: There is no abdominal tenderness.  Musculoskeletal:        General: Normal range of motion.     Cervical back: Neck supple.  Skin:    General: Skin is warm and dry.  Neurological:     Mental Status: He is alert and oriented to person, place, and time.  Psychiatric:        Attention and Perception: He perceives auditory and visual hallucinations.        Mood and Affect: Mood is depressed.        Speech: Speech normal.        Behavior: Behavior normal. Behavior is cooperative.        Thought Content: Thought content is not paranoid or delusional. Thought content includes homicidal and suicidal ideation. Thought content does not include homicidal or suicidal plan.        Cognition and Memory: Cognition normal.        Judgment: Judgment normal.    Review of Systems  Constitutional: Negative.  HENT: Negative.   Eyes: Negative.   Respiratory: Negative.   Cardiovascular: Negative.   Gastrointestinal: Negative.   Genitourinary: Negative.   Musculoskeletal: Negative.   Skin: Negative.   Neurological: Negative.   Endo/Heme/Allergies: Negative.   Psychiatric/Behavioral: Positive for depression, hallucinations, substance abuse and suicidal ideas. The patient is nervous/anxious.     Blood pressure 96/69, pulse 100, temperature 98 F (36.7 C), temperature  source Oral, resp. rate 18, SpO2 97 %. There is no height or weight on file to calculate BMI.  Past Psychiatric History: alcohol abuse, substance induce mood disorder, depression   Is the patient at risk to self? Yes  Has the patient been a risk to self in the past 6 months? Yes .    Has the patient been a risk to self within the distant past? Yes   Is the patient a risk to others? No   Has the patient been a risk to others in the past 6 months? No   Has the patient been a risk to others within the distant past? No   Past Medical History:  Past Medical History:  Diagnosis Date  . Depression   . Prostate enlargement     Past Surgical History:  Procedure Laterality Date  . EYE SURGERY    . WRIST SURGERY Right     Family History:  Family History  Family history unknown: Yes    Social History:  Social History   Socioeconomic History  . Marital status: Single    Spouse name: Not on file  . Number of children: Not on file  . Years of education: Not on file  . Highest education level: Not on file  Occupational History  . Not on file  Tobacco Use  . Smoking status: Never Smoker  . Smokeless tobacco: Current User    Types: Chew  Substance and Sexual Activity  . Alcohol use: No  . Drug use: No  . Sexual activity: Yes    Birth control/protection: None  Other Topics Concern  . Not on file  Social History Narrative  . Not on file   Social Determinants of Health   Financial Resource Strain: Not on file  Food Insecurity: Not on file  Transportation Needs: Not on file  Physical Activity: Not on file  Stress: Not on file  Social Connections: Not on file  Intimate Partner Violence: Not on file    SDOH:  SDOH Screenings   Alcohol Screen: Not on file  Depression (PHQ2-9): Medium Risk  . PHQ-2 Score: 27  Financial Resource Strain: Not on file  Food Insecurity: Not on file  Housing: Not on file  Physical Activity: Not on file  Social Connections: Not on file   Stress: Not on file  Tobacco Use: High Risk  . Smoking Tobacco Use: Never Smoker  . Smokeless Tobacco Use: Current User  Transportation Needs: Not on file    Last Labs:  Admission on 09/21/2020  Component Date Value Ref Range Status  . POC Amphetamine UR 09/21/2020 None Detected  NONE DETECTED (Cut Off Level 1000 ng/mL) Final  . POC Secobarbital (BAR) 09/21/2020 None Detected  NONE DETECTED (Cut Off Level 300 ng/mL) Final  . POC Buprenorphine (BUP) 09/21/2020 None Detected  NONE DETECTED (Cut Off Level 10 ng/mL) Final  . POC Oxazepam (BZO) 09/21/2020 None Detected  NONE DETECTED (Cut Off Level 300 ng/mL) Final  . POC Cocaine UR 09/21/2020 None Detected  NONE DETECTED (Cut Off Level 300 ng/mL) Final  . POC Methamphetamine  UR 09/21/2020 None Detected  NONE DETECTED (Cut Off Level 1000 ng/mL) Final  . POC Morphine 09/21/2020 None Detected  NONE DETECTED (Cut Off Level 300 ng/mL) Final  . POC Oxycodone UR 09/21/2020 None Detected  NONE DETECTED (Cut Off Level 100 ng/mL) Final  . POC Methadone UR 09/21/2020 None Detected  NONE DETECTED (Cut Off Level 300 ng/mL) Final  . POC Marijuana UR 09/21/2020 None Detected  NONE DETECTED (Cut Off Level 50 ng/mL) Final  . SARSCOV2ONAVIRUS 2 AG 09/21/2020 NEGATIVE  NEGATIVE Final   Comment: (NOTE) SARS-CoV-2 antigen NOT DETECTED.   Negative results are presumptive.  Negative results do not preclude SARS-CoV-2 infection and should not be used as the sole basis for treatment or other patient management decisions, including infection  control decisions, particularly in the presence of clinical signs and  symptoms consistent with COVID-19, or in those who have been in contact with the virus.  Negative results must be combined with clinical observations, patient history, and epidemiological information. The expected result is Negative.  Fact Sheet for Patients: https://www.jennings-kim.com/  Fact Sheet for Healthcare  Providers: https://alexander-rogers.biz/  This test is not yet approved or cleared by the [place] FDA and  has been authorized for detection and/or diagnosis of SARS-CoV-2 by FDA under an Emergency Use Authorization (EUA).  This EUA will remain in effect (meaning this test can be used) for the duration of  the COV                          ID-19 declaration under Section 564(b)(1) of the Act, 21 U.S.C. section 360bbb-3(b)(1), unless the authorization is terminated or revoked sooner.    Admission on 09/18/2020, Discharged on 09/18/2020  Component Date Value Ref Range Status  . SARS Coronavirus 2 by RT PCR 09/18/2020 NEGATIVE  NEGATIVE Final   Comment: (NOTE) SARS-CoV-2 target nucleic acids are NOT DETECTED.  The SARS-CoV-2 RNA is generally detectable in upper respiratory specimens during the acute phase of infection. The lowest concentration of SARS-CoV-2 viral copies this assay can detect is 138 copies/mL. A negative result does not preclude SARS-Cov-2 infection and should not be used as the sole basis for treatment or other patient management decisions. A negative result may occur with  improper specimen collection/handling, submission of specimen other than nasopharyngeal swab, presence of viral mutation(s) within the areas targeted by this assay, and inadequate number of viral copies(<138 copies/mL). A negative result must be combined with clinical observations, patient history, and epidemiological information. The expected result is Negative.  Fact Sheet for Patients:  BloggerCourse.com  Fact Sheet for Healthcare Providers:  SeriousBroker.it  This test is no                          t yet approved or cleared by the [place] FDA and  has been authorized for detection and/or diagnosis of SARS-CoV-2 by FDA under an Emergency Use Authorization (EUA). This EUA will remain  in effect (meaning this test can  be used) for the duration of the COVID-19 declaration under Section 564(b)(1) of the Act, 21 U.S.C.section 360bbb-3(b)(1), unless the authorization is terminated  or revoked sooner.      . Influenza A by PCR 09/18/2020 NEGATIVE  NEGATIVE Final  . Influenza B by PCR 09/18/2020 NEGATIVE  NEGATIVE Final   Comment: (NOTE) The Xpert Xpress SARS-CoV-2/FLU/RSV plus assay is intended as an aid in the diagnosis of influenza from Nasopharyngeal swab specimens  and should not be used as a sole basis for treatment. Nasal washings and aspirates are unacceptable for Xpert Xpress SARS-CoV-2/FLU/RSV testing.  Fact Sheet for Patients: BloggerCourse.com  Fact Sheet for Healthcare Providers: SeriousBroker.it  This test is not yet approved or cleared by the [place] FDA and has been authorized for detection and/or diagnosis of SARS-CoV-2 by FDA under an Emergency Use Authorization (EUA). This EUA will remain in effect (meaning this test can be used) for the duration of the COVID-19 declaration under Section 564(b)(1) of the Act, 21 U.S.C. section 360bbb-3(b)(1), unless the authorization is terminated or revoked.  Performed at [facility], [address] [place], [place] [address]   . Sodium 09/18/2020 141  135 - 145 mmol/L Final  . Potassium 09/18/2020 3.9  3.5 - 5.1 mmol/L Final  . Chloride 09/18/2020 111  98 - 111 mmol/L Final  . CO2 09/18/2020 22  22 - 32 mmol/L Final  . Glucose, Bld 09/18/2020 97  70 - 99 mg/dL Final   Glucose reference range applies only to samples taken after fasting for at least 8 hours.  . BUN 09/18/2020 9  6 - 20 mg/dL Final  . Creatinine, Ser 09/18/2020 0.81  0.61 - 1.24 mg/dL Final  . Calcium 95/62/1308 7.6* 8.9 - 10.3 mg/dL Final  . Total Protein 09/18/2020 5.6* 6.5 - 8.1 g/dL Final  . Albumin 65/78/4696 3.2* 3.5 - 5.0 g/dL Final  . AST 29/52/8413 21  15 - 41 U/L Final  . ALT 09/18/2020 32  0 - 44 U/L  Final  . Alkaline Phosphatase 09/18/2020 82  38 - 126 U/L Final  . Total Bilirubin 09/18/2020 0.5  0.3 - 1.2 mg/dL Final  . GFR, Estimated 09/18/2020 >60  >60 mL/min Final   Comment: (NOTE) Calculated using the CKD-EPI Creatinine Equation (2021)   . Anion gap 09/18/2020 8  5 - 15 Final   Performed at [facility], [address], [place], [place] [address]  . Alcohol, Ethyl (B) 09/18/2020 211* <10 mg/dL Final   Comment: (NOTE) Lowest detectable limit for serum alcohol is 10 mg/dL.  For medical purposes only. Performed at [facility], [address] [place], [place] [address]   . Opiates 09/18/2020 NONE DETECTED  NONE DETECTED Final  . Cocaine 09/18/2020 NONE DETECTED  NONE DETECTED Final  . Benzodiazepines 09/18/2020 NONE DETECTED  NONE DETECTED Final  . Amphetamines 09/18/2020 NONE DETECTED  NONE DETECTED Final  . Tetrahydrocannabinol 09/18/2020 NONE DETECTED  NONE DETECTED Final  . Barbiturates 09/18/2020 NONE DETECTED  NONE DETECTED Final   Comment: (NOTE) DRUG SCREEN FOR MEDICAL PURPOSES ONLY.  IF CONFIRMATION IS NEEDED FOR ANY PURPOSE, NOTIFY LAB WITHIN 5 DAYS.  LOWEST DETECTABLE LIMITS FOR URINE DRUG SCREEN Drug Class                     Cutoff (ng/mL) Amphetamine and metabolites    1000 Barbiturate and metabolites    200 Benzodiazepine                 200 Tricyclics and metabolites     300 Opiates and metabolites        300 Cocaine and metabolites        300 THC                            50 Performed at [facility], [address], [place], [place]  [address]   . WBC 09/18/2020 4.8  4.0 - 10.5 K/uL Final  . RBC 09/18/2020 3.48* 4.22 - 5.81 MIL/uL Final  . Hemoglobin 09/18/2020 12.4* 13.0 - 17.0 g/dL Final  . HCT 16/01/9603 36.6* 39.0 - 52.0 % Final  . MCV 09/18/2020 105.2* 80.0 - 100.0 fL Final  . MCH 09/18/2020 35.6* 26.0 - 34.0 pg Final  . MCHC 09/18/2020 33.9  30.0 - 36.0 g/dL Final  . RDW 54/12/8117 12.9  11.5 - 15.5 % Final  .  Platelets 09/18/2020 231  150 - 400 K/uL Final  . nRBC 09/18/2020 0.0  0.0 - 0.2 % Final  . Neutrophils Relative % 09/18/2020 50  % Final  . Neutro Abs 09/18/2020 2.4  1.7 - 7.7 K/uL Final  . Lymphocytes Relative 09/18/2020 43  % Final  . Lymphs Abs 09/18/2020 2.1  0.7 - 4.0 K/uL Final  . Monocytes Relative 09/18/2020 6  % Final  . Monocytes Absolute 09/18/2020 0.3  0.1 - 1.0 K/uL Final  . Eosinophils Relative 09/18/2020 1  % Final  . Eosinophils Absolute 09/18/2020 0.0  0.0 - 0.5 K/uL Final  . Basophils Relative 09/18/2020 0  % Final  . Basophils Absolute 09/18/2020 0.0  0.0 - 0.1 K/uL Final  . Immature Granulocytes 09/18/2020 0  % Final  . Abs Immature Granulocytes 09/18/2020 0.01  0.00 - 0.07 K/uL Final   Performed at [facility], [address], [place], [place] [address]  . Salicylate Lvl 09/18/2020 <7.0* 7.0 - 30.0 mg/dL Final   Performed at [facility], [address] [place], [place] [address]  . Acetaminophen (Tylenol), Serum 09/18/2020 <10* 10 - 30 ug/mL Final   Comment: (NOTE) Therapeutic concentrations vary significantly. A range of 10-30 ug/mL  may be an effective concentration for many patients. However, some  are best treated at concentrations outside of this range. Acetaminophen concentrations >150 ug/mL at 4 hours after ingestion  and >50 ug/mL at 12 hours after ingestion are often associated with  toxic reactions.  Performed at [facility], [address] [place], [place] [address]   . Color, Urine 09/18/2020 COLORLESS* YELLOW Final  . APPearance 09/18/2020 CLEAR  CLEAR Final  . Specific Gravity, Urine 09/18/2020 1.003* 1.005 - 1.030 Final  . pH 09/18/2020 5.0  5.0 - 8.0 Final  . Glucose, UA 09/18/2020 NEGATIVE  NEGATIVE mg/dL Final  . Hgb urine dipstick 09/18/2020 NEGATIVE  NEGATIVE Final  . Bilirubin Urine 09/18/2020 NEGATIVE  NEGATIVE Final  . Ketones, ur 09/18/2020 NEGATIVE  NEGATIVE mg/dL Final  . Protein, ur 78/46/9629 NEGATIVE   NEGATIVE mg/dL Final  . Nitrite 52/84/1324 NEGATIVE  NEGATIVE Final  . Glori Luis 09/18/2020 NEGATIVE  NEGATIVE Final   Performed at [facility], [address] [place], [place] [address]  Admission on 08/29/2020, Discharged on 08/30/2020  Component Date Value Ref Range Status  . SARS Coronavirus 2 by RT PCR 08/29/2020 NEGATIVE  NEGATIVE Final   Comment: (NOTE) SARS-CoV-2 target nucleic acids are NOT DETECTED.  The SARS-CoV-2 RNA is generally detectable in upper respiratory specimens during the acute phase of infection. The lowest concentration of SARS-CoV-2 viral copies this assay can detect is 138 copies/mL. A negative result does not preclude SARS-Cov-2 infection and should not be used as the sole basis for treatment or other patient management decisions. A negative result may occur with  improper specimen collection/handling, submission of specimen other than nasopharyngeal swab, presence of viral mutation(s) within the areas targeted by  this assay, and inadequate number of viral copies(<138 copies/mL). A negative result must be combined with clinical observations, patient history, and epidemiological information. The expected result is Negative.  Fact Sheet for Patients:  BloggerCourse.com  Fact Sheet for Healthcare Providers:  SeriousBroker.it  This test is no                          t yet approved or cleared by the [place] FDA and  has been authorized for detection and/or diagnosis of SARS-CoV-2 by FDA under an Emergency Use Authorization (EUA). This EUA will remain  in effect (meaning this test can be used) for the duration of the COVID-19 declaration under Section 564(b)(1) of the Act, 21 U.S.C.section 360bbb-3(b)(1), unless the authorization is terminated  or revoked sooner.      . Influenza A by PCR 08/29/2020 NEGATIVE  NEGATIVE Final  . Influenza B by PCR 08/29/2020 NEGATIVE  NEGATIVE Final    Comment: (NOTE) The Xpert Xpress SARS-CoV-2/FLU/RSV plus assay is intended as an aid in the diagnosis of influenza from Nasopharyngeal swab specimens and should not be used as a sole basis for treatment. Nasal washings and aspirates are unacceptable for Xpert Xpress SARS-CoV-2/FLU/RSV testing.  Fact Sheet for Patients: BloggerCourse.com  Fact Sheet for Healthcare Providers: SeriousBroker.it  This test is not yet approved or cleared by the [place] FDA and has been authorized for detection and/or diagnosis of SARS-CoV-2 by FDA under an Emergency Use Authorization (EUA). This EUA will remain in effect (meaning this test can be used) for the duration of the COVID-19 declaration under Section 564(b)(1) of the Act, 21 U.S.C. section 360bbb-3(b)(1), unless the authorization is terminated or revoked.  Performed at [facility], [address] [place], [place] [address]   . Sodium 08/29/2020 139  135 - 145 mmol/L Final  . Potassium 08/29/2020 3.7  3.5 - 5.1 mmol/L Final  . Chloride 08/29/2020 101  98 - 111 mmol/L Final  . CO2 08/29/2020 30  22 - 32 mmol/L Final  . Glucose, Bld 08/29/2020 100* 70 - 99 mg/dL Final   Glucose reference range applies only to samples taken after fasting for at least 8 hours.  . BUN 08/29/2020 10  6 - 20 mg/dL Final  . Creatinine, Ser 08/29/2020 0.86  0.61 - 1.24 mg/dL Final  . Calcium 41/32/4401 8.9  8.9 - 10.3 mg/dL Final  . Total Protein 08/29/2020 8.4* 6.5 - 8.1 g/dL Final  . Albumin 02/72/5366 4.5  3.5 - 5.0 g/dL Final  . AST 44/06/4740 21  15 - 41 U/L Final  . ALT 08/29/2020 60* 0 - 44 U/L Final  . Alkaline Phosphatase 08/29/2020 91  38 - 126 U/L Final  . Total Bilirubin 08/29/2020 0.3  0.3 - 1.2 mg/dL Final  . GFR, Estimated 08/29/2020 >60  >60 mL/min Final   Comment: (NOTE) Calculated using the CKD-EPI Creatinine Equation (2021)   . Anion gap 08/29/2020 8  5 - 15 Final    Performed at [facility], [address] [place], [place] [address]  . Alcohol, Ethyl (B) 08/29/2020 313* <10 mg/dL Final   Comment: CRITICAL RESULT CALLED TO, READ BACK BY AND VERIFIED WITH: BANNO, RN @ [phone] ON 08/29/20 C VARNER (NOTE) Lowest detectable limit for serum alcohol is 10 mg/dL.  For medical purposes only. Performed at [facility], [address], [place], [place] [address]   . Opiates 08/29/2020 NONE DETECTED  NONE DETECTED  Final  . Cocaine 08/29/2020 NONE DETECTED  NONE DETECTED Final  . Benzodiazepines 08/29/2020 NONE DETECTED  NONE DETECTED Final  . Amphetamines 08/29/2020 NONE DETECTED  NONE DETECTED Final  . Tetrahydrocannabinol 08/29/2020 NONE DETECTED  NONE DETECTED Final  . Barbiturates 08/29/2020 NONE DETECTED  NONE DETECTED Final   Comment: (NOTE) DRUG SCREEN FOR MEDICAL PURPOSES ONLY.  IF CONFIRMATION IS NEEDED FOR ANY PURPOSE, NOTIFY LAB WITHIN 5 DAYS.  LOWEST DETECTABLE LIMITS FOR URINE DRUG SCREEN Drug Class                     Cutoff (ng/mL) Amphetamine and metabolites    1000 Barbiturate and metabolites    200 Benzodiazepine                 200 Tricyclics and metabolites     300 Opiates and metabolites        300 Cocaine and metabolites        300 THC                            50 Performed at [facility], [address] [place], [place] [address]   . WBC 08/29/2020 10.1  4.0 - 10.5 K/uL Final  . RBC 08/29/2020 4.29  4.22 - 5.81 MIL/uL Final  . Hemoglobin 08/29/2020 14.9  13.0 - 17.0 g/dL Final  . HCT 60/45/4098 45.0  39.0 - 52.0 % Final  . MCV 08/29/2020 104.9* 80.0 - 100.0 fL Final  . MCH 08/29/2020 34.7* 26.0 - 34.0 pg Final  . MCHC 08/29/2020 33.1  30.0 - 36.0 g/dL Final  . RDW 11/91/4782 13.5  11.5 - 15.5 % Final  . Platelets 08/29/2020 324  150 - 400 K/uL Final  . nRBC 08/29/2020 0.0  0.0 - 0.2 % Final  . Neutrophils Relative % 08/29/2020 58  % Final  . Neutro Abs 08/29/2020  5.7  1.7 - 7.7 K/uL Final  . Lymphocytes Relative 08/29/2020 37  % Final  . Lymphs Abs 08/29/2020 3.8  0.7 - 4.0 K/uL Final  . Monocytes Relative 08/29/2020 5  % Final  . Monocytes Absolute 08/29/2020 0.5  0.1 - 1.0 K/uL Final  . Eosinophils Relative 08/29/2020 0  % Final  . Eosinophils Absolute 08/29/2020 0.0  0.0 - 0.5 K/uL Final  . Basophils Relative 08/29/2020 0  % Final  . Basophils Absolute 08/29/2020 0.0  0.0 - 0.1 K/uL Final  . Immature Granulocytes 08/29/2020 0  % Final  . Abs Immature Granulocytes 08/29/2020 0.03  0.00 - 0.07 K/uL Final   Performed at [facility], [address] [place], [place] [address]  . Salicylate Lvl 08/29/2020 <7.0* 7.0 - 30.0 mg/dL Final   Performed at [facility], [address], [place], [place] [address]  . Acetaminophen (Tylenol), Serum 08/29/2020 <10* 10 - 30 ug/mL Final   Comment: (NOTE) Therapeutic concentrations vary significantly. A range of 10-30 ug/mL  may be an effective concentration for many patients. However, some  are best treated at concentrations outside of this range. Acetaminophen concentrations >150 ug/mL at 4 hours after ingestion  and >50 ug/mL at 12 hours after ingestion are often associated with  toxic reactions.  Performed at [facility], [address] [place], [place] [address]   . Glucose-Capillary 08/29/2020 119* 70 - 99 mg/dL Final   Glucose reference range applies only to samples taken after fasting for at least  8 hours.  Admission on 08/21/2020, Discharged on 08/22/2020  Component Date Value Ref Range Status  . SARS Coronavirus 2 by RT PCR 08/21/2020 NEGATIVE  NEGATIVE Final   Comment: (NOTE) SARS-CoV-2 target nucleic acids are NOT DETECTED.  The SARS-CoV-2 RNA is generally detectable in upper respiratory specimens during the acute phase of infection. The lowest concentration of SARS-CoV-2 viral copies this assay can detect is 138 copies/mL. A negative  result does not preclude SARS-Cov-2 infection and should not be used as the sole basis for treatment or other patient management decisions. A negative result may occur with  improper specimen collection/handling, submission of specimen other than nasopharyngeal swab, presence of viral mutation(s) within the areas targeted by this assay, and inadequate number of viral copies(<138 copies/mL). A negative result must be combined with clinical observations, patient history, and epidemiological information. The expected result is Negative.  Fact Sheet for Patients:  BloggerCourse.comhttps://www.fda.gov/media/152166/download  Fact Sheet for Healthcare Providers:  SeriousBroker.ithttps://www.fda.gov/media/152162/download  This test is no                          t yet approved or cleared by the [place] FDA and  has been authorized for detection and/or diagnosis of SARS-CoV-2 by FDA under an Emergency Use Authorization (EUA). This EUA will remain  in effect (meaning this test can be used) for the duration of the COVID-19 declaration under Section 564(b)(1) of the Act, 21 U.S.C.section 360bbb-3(b)(1), unless the authorization is terminated  or revoked sooner.      . Influenza A by PCR 08/21/2020 NEGATIVE  NEGATIVE Final  . Influenza B by PCR 08/21/2020 NEGATIVE  NEGATIVE Final   Comment: (NOTE) The Xpert Xpress SARS-CoV-2/FLU/RSV plus assay is intended as an aid in the diagnosis of influenza from Nasopharyngeal swab specimens and should not be used as a sole basis for treatment. Nasal washings and aspirates are unacceptable for Xpert Xpress SARS-CoV-2/FLU/RSV testing.  Fact Sheet for Patients: BloggerCourse.comhttps://www.fda.gov/media/152166/download  Fact Sheet for Healthcare Providers: SeriousBroker.ithttps://www.fda.gov/media/152162/download  This test is not yet approved or cleared by the [place] FDA and has been authorized for detection and/or diagnosis of SARS-CoV-2 by FDA under an Emergency Use Authorization (EUA). This EUA  will remain in effect (meaning this test can be used) for the duration of the COVID-19 declaration under Section 564(b)(1) of the Act, 21 U.S.C. section 360bbb-3(b)(1), unless the authorization is terminated or revoked.  Performed at [facility], [address] [place], [place] [id number]   . Sodium 08/21/2020 136  135 - 145 mmol/L Final  . Potassium 08/21/2020 3.6  3.5 - 5.1 mmol/L Final  . Chloride 08/21/2020 103  98 - 111 mmol/L Final  . CO2 08/21/2020 21* 22 - 32 mmol/L Final  . Glucose, Bld 08/21/2020 94  70 - 99 mg/dL Final   Glucose reference range applies only to samples taken after fasting for at least 8 hours.  . BUN 08/21/2020 11  6 - 20 mg/dL Final  . Creatinine, Ser 08/21/2020 0.82  0.61 - 1.24 mg/dL Final  . Calcium 60/45/409805/07/2020 8.8* 8.9 - 10.3 mg/dL Final  . Total Protein 08/21/2020 7.8  6.5 - 8.1 g/dL Final  . Albumin 11/91/478205/07/2020 4.4  3.5 - 5.0 g/dL Final  . AST 95/62/130805/07/2020 71* 15 - 41 U/L Final  . ALT 08/21/2020 257* 0 - 44 U/L Final  . Alkaline Phosphatase 08/21/2020 97  38 - 126 U/L Final  . Total Bilirubin 08/21/2020 0.5  0.3 -  1.2 mg/dL Final  . GFR, Estimated 08/21/2020 >60  >60 mL/min Final   Comment: (NOTE) Calculated using the CKD-EPI Creatinine Equation (2021)   . Anion gap 08/21/2020 12  5 - 15 Final   Performed at [facility], [address] [place], [place] [address]  . Alcohol, Ethyl (B) 08/21/2020 263* <10 mg/dL Final   Comment: (NOTE) Lowest detectable limit for serum alcohol is 10 mg/dL.  For medical purposes only. Performed at [facility], [address] [place], [place] [address]   . Opiates 08/21/2020 NONE DETECTED  NONE DETECTED Final  . Cocaine 08/21/2020 NONE DETECTED  NONE DETECTED Final  . Benzodiazepines 08/21/2020 POSITIVE* NONE DETECTED Final  . Amphetamines 08/21/2020 NONE DETECTED  NONE DETECTED Final  . Tetrahydrocannabinol 08/21/2020 NONE DETECTED  NONE DETECTED Final   . Barbiturates 08/21/2020 NONE DETECTED  NONE DETECTED Final   Comment: (NOTE) DRUG SCREEN FOR MEDICAL PURPOSES ONLY.  IF CONFIRMATION IS NEEDED FOR ANY PURPOSE, NOTIFY LAB WITHIN 5 DAYS.  LOWEST DETECTABLE LIMITS FOR URINE DRUG SCREEN Drug Class                     Cutoff (ng/mL) Amphetamine and metabolites    1000 Barbiturate and metabolites    200 Benzodiazepine                 200 Tricyclics and metabolites     300 Opiates and metabolites        300 Cocaine and metabolites        300 THC                            50 Performed at [facility], [address], [place], [place] [address]   . WBC 08/21/2020 8.6  4.0 - 10.5 K/uL Final  . RBC 08/21/2020 4.14* 4.22 - 5.81 MIL/uL Final  . Hemoglobin 08/21/2020 14.3  13.0 - 17.0 g/dL Final  . HCT 27/25/3664 42.6  39.0 - 52.0 % Final  . MCV 08/21/2020 102.9* 80.0 - 100.0 fL Final  . MCH 08/21/2020 34.5* 26.0 - 34.0 pg Final  . MCHC 08/21/2020 33.6  30.0 - 36.0 g/dL Final  . RDW 40/34/7425 13.2  11.5 - 15.5 % Final  . Platelets 08/21/2020 281  150 - 400 K/uL Final  . nRBC 08/21/2020 0.0  0.0 - 0.2 % Final  . Neutrophils Relative % 08/21/2020 56  % Final  . Neutro Abs 08/21/2020 4.8  1.7 - 7.7 K/uL Final  . Lymphocytes Relative 08/21/2020 34  % Final  . Lymphs Abs 08/21/2020 2.9  0.7 - 4.0 K/uL Final  . Monocytes Relative 08/21/2020 7  % Final  . Monocytes Absolute 08/21/2020 0.6  0.1 - 1.0 K/uL Final  . Eosinophils Relative 08/21/2020 2  % Final  . Eosinophils Absolute 08/21/2020 0.2  0.0 - 0.5 K/uL Final  . Basophils Relative 08/21/2020 1  % Final  . Basophils Absolute 08/21/2020 0.1  0.0 - 0.1 K/uL Final  . Immature Granulocytes 08/21/2020 0  % Final  . Abs Immature Granulocytes 08/21/2020 0.02  0.00 - 0.07 K/uL Final   Performed at [facility], [address] [place], [place] [address]  . Salicylate Lvl 08/21/2020 <7.0* 7.0 - 30.0 mg/dL Final   Performed at [facility], [address] [place], [place] [address]  . Acetaminophen (Tylenol), Serum 08/21/2020 <10* 10 - 30 ug/mL  Final   Comment: (NOTE) Therapeutic concentrations vary significantly. A range of 10-30 ug/mL  may be an effective concentration for many patients. However, some  are best treated at concentrations outside of this range. Acetaminophen concentrations >150 ug/mL at 4 hours after ingestion  and >50 ug/mL at 12 hours after ingestion are often associated with  toxic reactions.  Performed at [facility], [address], [place], [place] [address]   Admission on 08/06/2020, Discharged on 08/07/2020  Component Date Value Ref Range Status  . Sodium 08/06/2020 136  135 - 145 mmol/L Final  . Potassium 08/06/2020 4.0  3.5 - 5.1 mmol/L Final  . Chloride 08/06/2020 103  98 - 111 mmol/L Final  . CO2 08/06/2020 25  22 - 32 mmol/L Final  . Glucose, Bld 08/06/2020 89  70 - 99 mg/dL Final   Glucose reference range applies only to samples taken after fasting for at least 8 hours.  . BUN 08/06/2020 16  6 - 20 mg/dL Final  . Creatinine, Ser 08/06/2020 0.87  0.61 - 1.24 mg/dL Final  . Calcium 60/45/4098 8.7* 8.9 - 10.3 mg/dL Final  . Total Protein 08/06/2020 6.8  6.5 - 8.1 g/dL Final  . Albumin 11/91/4782 3.8  3.5 - 5.0 g/dL Final  . AST 95/62/1308 138* 15 - 41 U/L Final  . ALT 08/06/2020 383* 0 - 44 U/L Final  . Alkaline Phosphatase 08/06/2020 108  38 - 126 U/L Final  . Total Bilirubin 08/06/2020 0.5  0.3 - 1.2 mg/dL Final  . GFR, Estimated 08/06/2020 >60  >60 mL/min Final   Comment: (NOTE) Calculated using the CKD-EPI Creatinine Equation (2021)   . Anion gap 08/06/2020 8  5 - 15 Final   Performed at [facility], [address], [place], [place] [address]  . WBC 08/06/2020 5.0  4.0 - 10.5 K/uL Final  . RBC 08/06/2020 4.25  4.22 - 5.81 MIL/uL Final  . Hemoglobin 08/06/2020 14.8  13.0 - 17.0 g/dL Final  . HCT 69/62/9528 44.4  39.0 - 52.0 % Final  . MCV  08/06/2020 104.5* 80.0 - 100.0 fL Final  . MCH 08/06/2020 34.8* 26.0 - 34.0 pg Final  . MCHC 08/06/2020 33.3  30.0 - 36.0 g/dL Final  . RDW 41/32/4401 12.7  11.5 - 15.5 % Final  . Platelets 08/06/2020 222  150 - 400 K/uL Final  . nRBC 08/06/2020 0.0  0.0 - 0.2 % Final  . Neutrophils Relative % 08/06/2020 48  % Final  . Neutro Abs 08/06/2020 2.4  1.7 - 7.7 K/uL Final  . Lymphocytes Relative 08/06/2020 35  % Final  . Lymphs Abs 08/06/2020 1.8  0.7 - 4.0 K/uL Final  . Monocytes Relative 08/06/2020 10  % Final  . Monocytes Absolute 08/06/2020 0.5  0.1 - 1.0 K/uL Final  . Eosinophils Relative 08/06/2020 6  % Final  . Eosinophils Absolute 08/06/2020 0.3  0.0 - 0.5 K/uL Final  . Basophils Relative 08/06/2020 1  % Final  . Basophils Absolute 08/06/2020 0.0  0.0 - 0.1 K/uL Final  . Immature Granulocytes 08/06/2020 0  % Final  . Abs Immature Granulocytes 08/06/2020 0.02  0.00 - 0.07 K/uL Final   Performed at [facility], [address], [place], [place] [address]  . Alcohol, Ethyl (B) 08/06/2020 142* <10 mg/dL Final   Comment: (NOTE) Lowest detectable limit for serum alcohol is 10 mg/dL.  For medical purposes only. Performed at [facility], [address]  [place], [place] [address]   . Salicylate Lvl 08/06/2020 <7.0* 7.0 - 30.0 mg/dL Final   Performed at [facility], [address] [place], [place] [address]  . Acetaminophen (Tylenol), Serum 08/06/2020 <10* 10 - 30 ug/mL Final   Comment: (NOTE) Therapeutic concentrations vary significantly. A range of 10-30 ug/mL  may be an effective concentration for many patients. However, some  are best treated at concentrations outside of this range. Acetaminophen concentrations >150 ug/mL at 4 hours after ingestion  and >50 ug/mL at 12 hours after ingestion are often associated with  toxic reactions.  Performed at [facility], [address] [place], [place] [address]   . Color, Urine 08/06/2020 STRAW* YELLOW Final   . APPearance 08/06/2020 CLEAR  CLEAR Final  . Specific Gravity, Urine 08/06/2020 1.009  1.005 - 1.030 Final  . pH 08/06/2020 5.0  5.0 - 8.0 Final  . Glucose, UA 08/06/2020 NEGATIVE  NEGATIVE mg/dL Final  . Hgb urine dipstick 08/06/2020 NEGATIVE  NEGATIVE Final  . Bilirubin Urine 08/06/2020 NEGATIVE  NEGATIVE Final  . Ketones, ur 08/06/2020 NEGATIVE  NEGATIVE mg/dL Final  . Protein, ur 91/47/8295 NEGATIVE  NEGATIVE mg/dL Final  . Nitrite 62/13/0865 NEGATIVE  NEGATIVE Final  . Glori Luis 08/06/2020 NEGATIVE  NEGATIVE Final   Performed at [facility], [address], [place], [place] [address]  . Opiates 08/06/2020 NONE DETECTED  NONE DETECTED Final  . Cocaine 08/06/2020 NONE DETECTED  NONE DETECTED Final  . Benzodiazepines 08/06/2020 NONE DETECTED  NONE DETECTED Final  . Amphetamines 08/06/2020 POSITIVE* NONE DETECTED Final  . Tetrahydrocannabinol 08/06/2020 NONE DETECTED  NONE DETECTED Final  . Barbiturates 08/06/2020 NONE DETECTED  NONE DETECTED Final   Comment: (NOTE) DRUG SCREEN FOR MEDICAL PURPOSES ONLY.  IF CONFIRMATION IS NEEDED FOR ANY PURPOSE, NOTIFY LAB WITHIN 5 DAYS.  LOWEST DETECTABLE LIMITS FOR URINE DRUG SCREEN Drug Class                     Cutoff (ng/mL) Amphetamine and metabolites    1000 Barbiturate and metabolites    200 Benzodiazepine                 200 Tricyclics and metabolites     300 Opiates and metabolites        300 Cocaine and metabolites        300 THC                            50 Performed at [facility], [address], [place], [place] [address]   . Specimen Description 08/06/2020 URINE, RANDOM   Final  . Special Requests 08/06/2020 NONE   Final  . Culture 08/06/2020    Final                   Value:NO GROWTH Performed at [facility], [address], [place], [place] [address]   . Report Status 08/06/2020 08/07/2020 FINAL   Final  . Hepatitis B Surface Ag 08/06/2020 NON REACTIVE  NON REACTIVE Final  . HCV Ab  08/06/2020 NON REACTIVE  NON REACTIVE Final   Comment: (NOTE) Nonreactive HCV antibody screen is consistent with no HCV infections,  unless recent infection is suspected or other evidence exists to indicate HCV infection.    . Hep A IgM 08/06/2020 NON REACTIVE  NON REACTIVE Final  . Hep B C IgM 08/06/2020 NON REACTIVE  NON REACTIVE Final   Performed at [facility], [address], [place], [place] [address]  . SARS Coronavirus 2 by RT PCR 08/06/2020 NEGATIVE  NEGATIVE Final   Comment: (NOTE) SARS-CoV-2 target nucleic acids are NOT DETECTED.  The SARS-CoV-2 RNA is generally detectable in upper respiratory specimens during the acute phase of infection. The lowest concentration of SARS-CoV-2 viral copies this assay can detect is 138 copies/mL. A negative result does not preclude SARS-Cov-2 infection and should not be used as the sole basis for treatment or other patient management decisions. A negative result may occur with  improper specimen collection/handling, submission of specimen other than nasopharyngeal swab, presence of viral mutation(s) within the areas targeted by this assay, and inadequate number of viral copies(<138 copies/mL). A negative result must be combined with clinical observations, patient history, and epidemiological information. The expected result is Negative.  Fact Sheet for Patients:  BloggerCourse.com  Fact Sheet for Healthcare Providers:  SeriousBroker.it  This test is no                          t yet approved or cleared by the [place] FDA and  has been authorized for detection and/or diagnosis of SARS-CoV-2 by FDA under an Emergency Use Authorization (EUA). This EUA will remain  in effect (meaning this test can be used) for the duration of the COVID-19 declaration under Section 564(b)(1) of the Act, 21 U.S.C.section 360bbb-3(b)(1), unless the authorization is terminated  or revoked sooner.       . Influenza A by PCR 08/06/2020 NEGATIVE  NEGATIVE Final  . Influenza B by PCR 08/06/2020 NEGATIVE  NEGATIVE Final   Comment: (NOTE) The Xpert Xpress SARS-CoV-2/FLU/RSV plus assay is intended as an aid in the diagnosis of influenza from Nasopharyngeal swab specimens and should not be used as a sole basis for treatment. Nasal washings and aspirates are unacceptable for Xpert Xpress SARS-CoV-2/FLU/RSV testing.  Fact Sheet for Patients: BloggerCourse.com  Fact Sheet for Healthcare Providers: SeriousBroker.it  This test is not yet approved or cleared by the [place] FDA and has been authorized for detection and/or diagnosis of SARS-CoV-2 by FDA under an Emergency Use Authorization (EUA). This EUA will remain in effect (meaning this test can be used) for the duration of the COVID-19 declaration under Section 564(b)(1) of the Act, 21 U.S.C. section 360bbb-3(b)(1), unless the authorization is terminated or revoked.  Performed at [facility], [address], [place], [place] [address]   . TSH 08/07/2020 2.254  0.350 - 4.500 uIU/mL Final   Comment: Performed by a 3rd Generation assay with a functional sensitivity of <=0.01 uIU/mL. Performed at [facility], [address] [place], [place] [address]   Admission on 07/11/2020, Discharged on 07/11/2020  Component Date Value Ref Range Status  . Color, UA 07/11/2020 yellow  yellow Final  . Clarity, UA 07/11/2020 clear  clear Final  . Glucose, UA 07/11/2020 negative  negative mg/dL Final  . Bilirubin, UA 07/11/2020 negative  negative Final  . Ketones, POC UA 07/11/2020 negative  negative mg/dL Final  . Spec Grav, UA 07/11/2020 1.025  1.010 - 1.025 Final  . Blood, UA 07/11/2020 trace-intact* negative Final  . pH, UA 07/11/2020 6.0  5.0 - 8.0 Final  . Protein Ur, POC 07/11/2020 negative  negative mg/dL Final  . Urobilinogen, UA 07/11/2020 0.2  0.2 or 1.0 E.U./dL  Final  . Nitrite, UA 91/47/8295 Negative  Negative Final  .  Leukocytes, UA 07/11/2020 Negative  Negative Final  . Specimen Description 07/11/2020 URINE, CLEAN CATCH   Final  . Special Requests 07/11/2020 NONE   Final  . Culture 07/11/2020 *  Final                   Value:<10,000 COLONIES/mL INSIGNIFICANT GROWTH Performed at [facility], [address] [place], [place] [address]   . Report Status 07/11/2020 07/13/2020 FINAL   Final  Admission on 05/01/2020, Discharged on 05/01/2020  Component Date Value Ref Range Status  . SARS Coronavirus 2 05/01/2020 POSITIVE* NEGATIVE Final   Comment: (NOTE) SARS-CoV-2 target nucleic acids are DETECTED.  The SARS-CoV-2 RNA is generally detectable in upper and lower respiratory specimens during the acute phase of infection. Positive results are indicative of the presence of SARS-CoV-2 RNA. Clinical correlation with patient history and other diagnostic information is  necessary to determine patient infection status. Positive results do not rule out bacterial infection or co-infection with other viruses.  The expected result is Negative.  Fact Sheet for Patients: HairSlick.no  Fact Sheet for Healthcare Providers: quierodirigir.com  This test is not yet approved or cleared by the [place] FDA and  has been authorized for detection and/or diagnosis of SARS-CoV-2 by FDA under an Emergency Use Authorization (EUA). This EUA will remain  in effect (meaning this test can be used) for the duration of the COVID-19 declaration under Section 564(b)(1) of the Act, 21 U.                          S.C. section 360bbb-3(b)(1), unless the authorization is terminated or revoked sooner.   Performed at [facility], [address] [place], [place] [address]   Admission on 04/10/2020, Discharged on 04/11/2020  Component Date Value Ref Range Status  . Sodium 04/10/2020 142  135 - 145  mmol/L Final  . Potassium 04/10/2020 3.9  3.5 - 5.1 mmol/L Final  . Chloride 04/10/2020 106  98 - 111 mmol/L Final  . CO2 04/10/2020 25  22 - 32 mmol/L Final  . Glucose, Bld 04/10/2020 111* 70 - 99 mg/dL Final   Glucose reference range applies only to samples taken after fasting for at least 8 hours.  . BUN 04/10/2020 5* 6 - 20 mg/dL Final  . Creatinine, Ser 04/10/2020 0.79  0.61 - 1.24 mg/dL Final  . Calcium 29/93/7169 9.1  8.9 - 10.3 mg/dL Final  . GFR, Estimated 04/10/2020 >60  >60 mL/min Final   Comment: (NOTE) Calculated using the CKD-EPI Creatinine Equation (2021)   . Anion gap 04/10/2020 11  5 - 15 Final   Performed at [facility], [address] [place], [place] [address]  . WBC 04/10/2020 7.0  4.0 - 10.5 K/uL Final  . RBC 04/10/2020 4.51  4.22 - 5.81 MIL/uL Final  . Hemoglobin 04/10/2020 15.9  13.0 - 17.0 g/dL Final  . HCT 81/04/7508 45.2  39.0 - 52.0 % Final  . MCV 04/10/2020 100.2* 80.0 - 100.0 fL Final  . MCH 04/10/2020 35.3* 26.0 - 34.0 pg Final  . MCHC 04/10/2020 35.2  30.0 - 36.0 g/dL Final  . RDW 25/85/2778 12.9  11.5 - 15.5 % Final  . Platelets 04/10/2020 315  150 - 400 K/uL Final  . nRBC 04/10/2020 0.0  0.0 - 0.2 % Final   Performed at [facility], [address], [place], [place] [address]  . Troponin I (High  Sensitivity) 04/10/2020 2  <18 ng/L Final   Comment: (NOTE) Elevated high sensitivity troponin I (hsTnI) values and significant  changes across serial measurements may suggest ACS but many other  chronic and acute conditions are known to elevate hsTnI results.  Refer to the "Links" section for chest pain algorithms and additional  guidance. Performed at [facility], [address] [place], [place] [address]   . Troponin I (High Sensitivity) 04/11/2020 3  <18 ng/L Final   Comment: (NOTE) Elevated high sensitivity troponin I (hsTnI) values and significant  changes across serial measurements may suggest ACS but many other  chronic  and acute conditions are known to elevate hsTnI results.  Refer to the "Links" section for chest pain algorithms and additional  guidance. Performed at [facility], [address], [place], [place] [address]     Allergies: Wellbutrin [bupropion]  PTA Medications: (Not in a hospital admission)   Medical Decision Making  Patient reports that he is unable to contract for safety and maintain safety due to ongoing suicidal ideation. Patient will be admitted to [facility] for continuous assessment. Patient will be reassessed by psychiatric on 09/22/20 -obtain blood alcohol level -continue home medication -CIWA protocol    Recommendations  Based on my evaluation the patient does not appear to have an emergency medical condition.  Maricela Bo, NP 09/22/20  1:58 AM

## 2020-09-22 NOTE — ED Notes (Signed)
Patient is resting calmly and comfortably with no s/sx of pain, discomfort, or disturbance. No further needs or concerns at the time of this writing.  Nursing will continue to monitor.

## 2020-09-22 NOTE — ED Notes (Signed)
Patient denies pain and is resting calmly and comfortably. Blood specimen obtained for BAL per Provider order.  Successful venipuncture x1 attempt via right hand vein.  Patient tolerated well.  No further needs or concerns at the time of this writing.  Nursing will continue to monitor.

## 2020-09-22 NOTE — Discharge Instructions (Signed)

## 2020-09-22 NOTE — ED Notes (Signed)
Patient alert and oriented x4, denies SI/HI and AVH. Patient discharged with community resources for alcohol substance  abuse. Patient given a bus ticket to get home. All belongings from locker given back to patient.

## 2020-09-22 NOTE — ED Provider Notes (Addendum)
FBC/OBS ASAP Discharge Summary  Date and Time: 09/22/2020 7:57 AM  Name: Ian Munoz  MRN:  [id number]   Discharge Diagnoses:  Final diagnoses:  Substance induced mood disorder (HCC)  Alcohol abuse  Severe episode of recurrent major depressive disorder, with psychotic features (HCC)    Subjective: Patient states "I was in a dark spot because I have taken myself off my medicines and I had a relapse."  Ian Munoz reports he feels better today.  He reports readiness to discharge to his [place] [facility].  He is reassessed by nurse practitioner.  He is alert and oriented, pleasant and cooperative during assessment.  His affect is bright and he is goal oriented.  He denies suicidal and homicidal ideations.  He contracts verbally for safety with this Clinical research associatewriter.  He denies auditory and visual hallucinations.  There is no evidence of delusional thought content he denies symptoms of paranoia.  Recent stressors include a relapse on alcohol 1 day ago.  He resides in a sober living [facility] and had 1 month sober from alcohol and methamphetamine prior to a relapse on alcohol yesterday.  Last use of methamphetamine 1 month ago.  He reports [facility] can return [facility]  Ian Munoz reports he has been diagnosed with anxiety and depression and is treated with escitalopram and Abilify prescribed [facility].  Ian Mun[facility].  He is enrolled in [facility] financial aid program.  Next appointment on October 06, 2020.  He reports [facility].   [place] in [place] in [place].  He denies access to weapons.  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  He endorses average sleep and appetite.  Patient offered support and encouragement.  He denies any person to contact for collateral information at  this time.  Stay Summary:  HPI from 09/21/2020 Ian Munoz is a 40y/o male. [facility] via [employer] examinerlaw enforcement. Patient report that he called law enforcement to [facility] due to worsening depression, suicidal ideation and alcohol detox. Patient endorses depressive symptoms of crying spells, guilt, loss of appetite, irritability, isolation, and worthlessness. He admits to noncompliance with his psychotropic medications (lexapro and Abilify) due to financial difficulties.    Patient report that he is unable to maintain safety due to ongoing passive suicidal ideation, patient is also endorsing passive homicidal ideation without specific individual target. Patient endorses auditory hallucination of hearing "voices," patient will not elaborate on what the voices are saying. He endorses visual hallucination of seeing shadows; he denies paranoia and no indication of delusional thought content noted. He reports drinking "a lot" of alcohol on a daily basis. He is admits to [employer]hen[employer]amine and marijuana abuse. He reports that he last used meth "last week" and used marijuana today. 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      Total Time spent with patient: 30 minutes  Past Psychiatric History: Major depressive disorder without psychotic features, substance-induced mood disorder, alcohol abuse, methamphetamine abuse Past Medical History:  Past Medical History:  Diagnosis Date  . Depression   . Prostate enlargement     Past Surgical History:  Procedure Laterality Date  . EYE  SURGERY    . WRIST SURGERY Right    Family History:  Family History  Family history unknown: Yes   Family Psychiatric History: None reported Social History:  Social History   Substance and Sexual Activity  Alcohol Use No      Social History   Substance and Sexual Activity  Drug Use No    Social History   Socioeconomic History  . Marital status: Single    Spouse name: Not on file  . Number of children: Not on file  . Years of education: Not on file  . Highest education level: Not on file  Occupational History  . Not on file  Tobacco Use  . Smoking status: Never Smoker  . Smokeless tobacco: Current User    Types: Chew  Substance and Sexual Activity  . Alcohol use: No  . Drug use: No  . Sexual activity: Yes    Birth control/protection: None  Other Topics Concern  . Not on file  Social History Narrative  . Not on file   Social Determinants of Health   Financial Resource Strain: Not on file  Food Insecurity: Not on file  Transportation Needs: Not on file  Physical Activity: Not on file  Stress: Not on file  Social Connections: Not on file   SDOH:  SDOH Screenings   Alcohol Screen: Not on file  Depression (PHQ2-9): Medium Risk  . PHQ-2 Score: 27  Financial Resource Strain: Not on file  Food Insecurity: Not on file  Housing: Not on file  Physical Activity: Not on file  Social Connections: Not on file  Stress: Not on file  Tobacco Use: High Risk  . Smoking Tobacco Use: Never Smoker  . Smokeless Tobacco Use: Current User  Transportation Needs: Not on file    Has this patient used any form of tobacco in the last 30 days? (Cigarettes, Smokeless Tobacco, Cigars, and/or Pipes) A prescription for an FDA-approved tobacco cessation medication was offered at discharge and the patient refused  Current Medications:  Current Facility-Administered Medications  Medication Dose Route Frequency Provider Last Rate Last Admin  . acetaminophen (TYLENOL) tablet 650 mg  650 mg Oral Q6H PRN Ajibola, Ene A, NP      . alum & mag hydroxide-simeth (MAALOX/MYLANTA) 200-200-20 MG/5ML suspension 30 mL  30 mL Oral Q4H PRN Ajibola, Ene A, NP      . ARIPiprazole (ABILIFY) tablet 5 mg  5 mg Oral Daily Ajibola, Ene  A, NP      . escitalopram (LEXAPRO) tablet 20 mg  20 mg Oral Daily Ajibola, Ene A, NP      . famotidine (PEPCID) tablet 20 mg  20 mg Oral BID Ajibola, Ene A, NP   20 mg at 09/21/20 2235  . hydrOXYzine (ATARAX/VISTARIL) tablet 25 mg  25 mg Oral Q6H PRN Ajibola, Ene A, NP      . levothyroxine (SYNTHROID) tablet 25 mcg  25 mcg Oral Q0600 Ajibola, Ene A, NP   25 mcg at 09/22/20 0625  . loperamide (IMODIUM) capsule 2-4 mg  2-4 mg Oral PRN Ajibola, Ene A, NP      . LORazepam (ATIVAN) tablet 1 mg  1 mg Oral Q6H PRN Ajibola, Ene A, NP      . magnesium hydroxide (MILK OF MAGNESIA) suspension 30 mL  30 mL Oral Daily PRN Ajibola, Ene A, NP      . multivitamin with minerals tablet 1 tablet  1 tablet Oral Daily Ajibola, Ene A, NP   1 tablet  at 09/21/20 2135  . ondansetron (ZOFRAN-ODT) disintegrating tablet 4 mg  4 mg Oral Q6H PRN Ajibola, Ene A, NP      . thiamine tablet 100 mg  100 mg Oral Daily Ajibola, Ene A, NP      . traZODone (DESYREL) tablet 50 mg  50 mg Oral QHS PRN Ajibola, Ene A, NP   50 mg at 09/21/20 2135   Current Outpatient Medications  Medication Sig Dispense Refill  . ARIPiprazole (ABILIFY) 10 MG tablet Take 0.5 tablets (5 mg total) by mouth daily. 30 tablet 1  . diphenhydrAMINE (BENADRYL) 25 MG tablet Take 1 tablet (25 mg total) by mouth every 6 (six) hours as needed for itching. 20 tablet 0  . escitalopram (LEXAPRO) 10 MG tablet Take 2 tablets (20 mg total) by mouth daily. 30 tablet 1  . famotidine (PEPCID) 20 MG tablet Take 1 tablet (20 mg total) by mouth 2 (two) times daily. 10 tablet 0  . levothyroxine (SYNTHROID) 25 MCG tablet Take 1 tablet (25 mcg total) by mouth daily before breakfast. 30 tablet 1  . traZODone (DESYREL) 100 MG tablet Take 1 tablet (100 mg total) by mouth at bedtime as needed for sleep. 30 tablet 1    PTA Medications: (Not in a hospital admission)   Musculoskeletal  Strength & Muscle Tone: within normal limits Gait & Station: normal Patient leans:  N/A  Psychiatric Specialty Exam  Presentation  General Appearance: Appropriate for Environment; Casual  Eye Contact:Good  Speech:Clear and Coherent; Normal Rate  Speech Volume:Normal  Handedness:Right   Mood and Affect  Mood:Euthymic  Affect:Appropriate; Congruent   Thought Process  Thought Processes:Coherent; Goal Directed  Descriptions of Associations:Intact  Orientation:Full (Time, Place and Person)  Thought Content:Logical  Diagnosis of Schizophrenia or Schizoaffective disorder in past: No    Hallucinations:Hallucinations: None  Ideas of Reference:None  Suicidal Thoughts:Suicidal Thoughts: No SI Passive Intent and/or Plan: Without Plan  Homicidal Thoughts:Homicidal Thoughts: No   Sensorium  Memory:Immediate Good; Recent Good; Remote Good  Judgment:Fair  Insight:Good   Executive Functions  Concentration:Good  Attention Span:Good  Recall:Good  Fund of Knowledge:Good  Language:Good   Psychomotor Activity  Psychomotor Activity:Psychomotor Activity: Normal   Assets  Assets:Communication Skills; Desire for Improvement; Financial Resources/Insurance; Housing; Intimacy; Leisure Time; Physical Health; Resilience; Social Support; Talents/Skills; Vocational/Educational   Sleep  Sleep:Sleep: Good Number of Hours of Sleep: 6   Nutritional Assessment (For OBS and FBC admissions only) Has the patient had a decrease in food intake/or appetite?: No Does the patient have dental problems?: No Does the patient have eating habits or behaviors that may be indicators of an eating disorder including binging or inducing vomiting?: No Has the patient recently lost weight without trying?: No Has the patient been eating poorly because of a decreased appetite?: No Malnutrition Screening Tool Score: 0    Physical Exam  Physical Exam Vitals and nursing note reviewed.  Constitutional:      Appearance: Normal appearance. He is well-developed and normal  weight.  HENT:     Head: Normocephalic and atraumatic.     Nose: Nose normal.  Cardiovascular:     Rate and Rhythm: Normal rate.  Pulmonary:     Effort: Pulmonary effort is normal.  Musculoskeletal:        General: Normal range of motion.     Cervical back: Normal range of motion.  Neurological:     Mental Status: He is alert and oriented to person, place, and time.  Psychiatric:  Attention and Perception: Attention and perception normal.        Mood and Affect: Mood and affect normal.        Speech: Speech normal.        Behavior: Behavior normal. Behavior is cooperative.        Thought Content: Thought content normal.        Cognition and Memory: Cognition and memory normal.        Judgment: Judgment normal.    Review of Systems  Constitutional: Negative.   HENT: Negative.   Eyes: Negative.   Respiratory: Negative.   Cardiovascular: Negative.   Gastrointestinal: Negative.   Genitourinary: Negative.   Musculoskeletal: Negative.   Skin: Negative.   Neurological: Negative.   Endo/Heme/Allergies: Negative.   Psychiatric/Behavioral: Positive for substance abuse.   Blood pressure 104/70, pulse 79, temperature 98 F (36.7 C), temperature source Oral, resp. rate 18, SpO2 98 %. There is no height or weight on file to calculate BMI.  Demographic Factors:  Male and Caucasian  Loss Factors: NA  Historical Factors: NA  Risk Reduction Factors:   Employed, Living with another person, especially a relative, Positive social support, Positive therapeutic relationship and Positive coping skills or problem solving skills  Continued Clinical Symptoms:  Alcohol/Substance Abuse/Dependencies  Cognitive Features That Contribute To Risk:  None    Suicide Risk:  Minimal: No identifiable suicidal ideation.  Patients presenting with no risk factors but with morbid ruminations; may be classified as minimal risk based on the severity of the depressive symptoms  Plan Of  Care/Follow-up recommendations:  Other:  Patient reviewed with Dr Bronwen Betters.  Recommend follow-up with outpatient psychiatry at [facility] behavioral health. Continue current medications including: - Aripiprazole 5 mg daily -Escitalopram 20 mg daily -trazodone 100mg  QHS PRN/sleep  Disposition: Discharge  , FNP 09/22/2020, 7:57 AM

## 2020-09-23 ENCOUNTER — Ambulatory Visit: Payer: Self-pay | Attending: Family Medicine

## 2020-09-23 ENCOUNTER — Other Ambulatory Visit: Payer: Self-pay

## 2020-09-23 DIAGNOSIS — F1994 Other psychoactive substance use, unspecified with psychoactive substance-induced mood disorder: Secondary | ICD-10-CM

## 2020-09-23 DIAGNOSIS — E039 Hypothyroidism, unspecified: Secondary | ICD-10-CM

## 2020-09-23 DIAGNOSIS — Z13220 Encounter for screening for lipoid disorders: Secondary | ICD-10-CM

## 2020-09-23 DIAGNOSIS — F332 Major depressive disorder, recurrent severe without psychotic features: Secondary | ICD-10-CM

## 2020-09-24 LAB — CBC WITH DIFFERENTIAL/PLATELET
Basophils Absolute: 0 10*3/uL (ref 0.0–0.2)
Basos: 0 %
EOS (ABSOLUTE): 0.1 10*3/uL (ref 0.0–0.4)
Eos: 1 %
Hematocrit: 44.3 % (ref 37.5–51.0)
Hemoglobin: 15.1 g/dL (ref 13.0–17.7)
Immature Grans (Abs): 0 10*3/uL (ref 0.0–0.1)
Immature Granulocytes: 0 %
Lymphocytes Absolute: 1.4 10*3/uL (ref 0.7–3.1)
Lymphs: 18 %
MCH: 34.4 pg — ABNORMAL HIGH (ref 26.6–33.0)
MCHC: 34.1 g/dL (ref 31.5–35.7)
MCV: 101 fL — ABNORMAL HIGH (ref 79–97)
Monocytes Absolute: 0.5 10*3/uL (ref 0.1–0.9)
Monocytes: 7 %
Neutrophils Absolute: 5.6 10*3/uL (ref 1.4–7.0)
Neutrophils: 74 %
Platelets: 290 10*3/uL (ref 150–450)
RBC: 4.39 x10E6/uL (ref 4.14–5.80)
RDW: 12.1 % (ref 11.6–15.4)
WBC: 7.6 10*3/uL (ref 3.4–10.8)

## 2020-09-24 LAB — COMP. METABOLIC PANEL (12)
AST: 44 IU/L — ABNORMAL HIGH (ref 0–40)
Albumin/Globulin Ratio: 1.6 (ref 1.2–2.2)
Albumin: 4.6 g/dL (ref 4.0–5.0)
Alkaline Phosphatase: 121 IU/L (ref 44–121)
BUN/Creatinine Ratio: 13 (ref 9–20)
BUN: 10 mg/dL (ref 6–20)
Bilirubin Total: 0.4 mg/dL (ref 0.0–1.2)
Calcium: 9.5 mg/dL (ref 8.7–10.2)
Chloride: 95 mmol/L — ABNORMAL LOW (ref 96–106)
Creatinine, Ser: 0.79 mg/dL (ref 0.76–1.27)
Globulin, Total: 2.8 g/dL (ref 1.5–4.5)
Glucose: 107 mg/dL — ABNORMAL HIGH (ref 65–99)
Potassium: 4.8 mmol/L (ref 3.5–5.2)
Sodium: 136 mmol/L (ref 134–144)
Total Protein: 7.4 g/dL (ref 6.0–8.5)
eGFR: 116 mL/min/{1.73_m2} (ref >59)

## 2020-09-24 LAB — LIPID PANEL
Chol/HDL Ratio: 3.1 ratio (ref 0.0–5.0)
Cholesterol, Total: 260 mg/dL — ABNORMAL HIGH (ref 100–199)
HDL: 83 mg/dL (ref >39)
LDL Chol Calc (NIH): 137 mg/dL — ABNORMAL HIGH (ref 0–99)
Triglycerides: 226 mg/dL — ABNORMAL HIGH (ref 0–149)
VLDL Cholesterol Cal: 40 mg/dL (ref 5–40)

## 2020-09-24 LAB — THYROID PANEL WITH TSH
Free Thyroxine Index: 1.5 (ref 1.2–4.9)
T3 Uptake Ratio: 24 % (ref 24–39)
T4, Total: 6.1 ug/dL (ref 4.5–12.0)
TSH: 1.68 u[IU]/mL (ref 0.450–4.500)

## 2020-09-24 LAB — VITAMIN D 25 HYDROXY (VIT D DEFICIENCY, FRACTURES): Vit D, 25-Hydroxy: 36.4 ng/mL (ref 30.0–100.0)

## 2020-09-25 ENCOUNTER — Other Ambulatory Visit: Payer: Self-pay

## 2020-09-26 ENCOUNTER — Other Ambulatory Visit: Payer: Self-pay

## 2020-09-29 ENCOUNTER — Ambulatory Visit (HOSPITAL_COMMUNITY)
Admission: EM | Admit: 2020-09-29 | Discharge: 2020-09-29 | Disposition: A | Discharge status: Home / Self Care | Payer: No Payment, Other | Attending: Psychiatry | Admitting: Psychiatry

## 2020-09-29 ENCOUNTER — Other Ambulatory Visit: Payer: Self-pay

## 2020-09-29 DIAGNOSIS — F1020 Alcohol dependence, uncomplicated: Secondary | ICD-10-CM | POA: Insufficient documentation

## 2020-09-29 DIAGNOSIS — Z79899 Other long term (current) drug therapy: Secondary | ICD-10-CM | POA: Insufficient documentation

## 2020-09-29 DIAGNOSIS — F1994 Other psychoactive substance use, unspecified with psychoactive substance-induced mood disorder: Secondary | ICD-10-CM | POA: Diagnosis not present

## 2020-09-29 DIAGNOSIS — R45851 Suicidal ideations: Secondary | ICD-10-CM | POA: Insufficient documentation

## 2020-09-29 DIAGNOSIS — E039 Hypothyroidism, unspecified: Secondary | ICD-10-CM | POA: Insufficient documentation

## 2020-09-29 MED ORDER — HYDROXYZINE HCL 25 MG PO TABS: 25.0000 mg | ORAL_TABLET | Freq: Four times a day (QID) | ORAL | Status: DC | PRN

## 2020-09-29 MED ORDER — LOPERAMIDE HCL 2 MG PO CAPS: 2.0000 mg | ORAL_CAPSULE | ORAL | Status: DC | PRN

## 2020-09-29 MED ORDER — THIAMINE HCL 100 MG/ML IJ SOLN: 100.0000 mg | Freq: Once | INTRAMUSCULAR | Status: DC

## 2020-09-29 MED ORDER — FAMOTIDINE 20 MG PO TABS: 20.0000 mg | ORAL_TABLET | Freq: Two times a day (BID) | ORAL | 0 refills | Status: DC

## 2020-09-29 MED ORDER — THIAMINE HCL 100 MG PO TABS: 100.0000 mg | ORAL_TABLET | Freq: Every day | ORAL | Status: DC

## 2020-09-29 MED ORDER — ONDANSETRON 4 MG PO TBDP: 4.0000 mg | ORAL_TABLET | Freq: Four times a day (QID) | ORAL | Status: DC | PRN

## 2020-09-29 MED ORDER — LEVOTHYROXINE SODIUM 25 MCG PO TABS
25.0000 ug | ORAL_TABLET | Freq: Every day | ORAL | Status: DC
  Filled 2020-09-29: qty 7

## 2020-09-29 MED ORDER — ADULT MULTIVITAMIN W/MINERALS CH: 1.0000 | ORAL_TABLET | Freq: Every day | ORAL | Status: DC

## 2020-09-29 MED ORDER — LEVOTHYROXINE SODIUM 25 MCG PO TABS: 25.0000 ug | ORAL_TABLET | Freq: Every day | ORAL | 0 refills | Status: DC

## 2020-09-29 MED ORDER — ESCITALOPRAM OXALATE 20 MG PO TABS
20.0000 mg | ORAL_TABLET | Freq: Every day | ORAL | Status: DC
  Filled 2020-09-29 (×3): qty 7

## 2020-09-29 MED ORDER — ACETAMINOPHEN 325 MG PO TABS: 650.0000 mg | ORAL_TABLET | Freq: Four times a day (QID) | ORAL | Status: DC | PRN

## 2020-09-29 MED ORDER — LORAZEPAM 1 MG PO TABS: 1.0000 mg | ORAL_TABLET | Freq: Four times a day (QID) | ORAL | Status: DC | PRN

## 2020-09-29 MED ORDER — ARIPIPRAZOLE 5 MG PO TABS
5.0000 mg | ORAL_TABLET | Freq: Every day | ORAL | Status: DC
  Filled 2020-09-29: qty 7

## 2020-09-29 MED ORDER — TRAZODONE HCL 100 MG PO TABS
100.0000 mg | ORAL_TABLET | Freq: Every evening | ORAL | Status: DC | PRN
  Filled 2020-09-29: qty 7

## 2020-09-29 MED ORDER — ALUM & MAG HYDROXIDE-SIMETH 200-200-20 MG/5ML PO SUSP: 30.0000 mL | ORAL | Status: DC | PRN

## 2020-09-29 MED ORDER — TRAZODONE HCL 100 MG PO TABS: 100.0000 mg | ORAL_TABLET | Freq: Every evening | ORAL | 0 refills | Status: DC | PRN

## 2020-09-29 MED ORDER — MAGNESIUM HYDROXIDE 400 MG/5ML PO SUSP: 30.0000 mL | Freq: Every day | ORAL | Status: DC | PRN

## 2020-09-29 MED ORDER — ARIPIPRAZOLE 5 MG PO TABS: 5.0000 mg | ORAL_TABLET | Freq: Every day | ORAL | 0 refills | Status: DC

## 2020-09-29 MED ORDER — ESCITALOPRAM OXALATE 10 MG PO TABS: 20.0000 mg | ORAL_TABLET | Freq: Every day | ORAL | 0 refills | Status: DC

## 2020-09-29 NOTE — ED Provider Notes (Addendum)
FBC/OBS ASAP Discharge Summary  Date and Time: 09/29/2020 3:03 PM  Name: Ian Munoz  MRN:  [id number]   Discharge Diagnoses:  Final diagnoses:  Alcohol use disorder, severe, dependence (HCC)  Substance induced mood disorder (HCC)  Hypothyroidism, unspecified type    Subjective: Patient is a 40 year old male who [facility] the [facility] reporting alcohol abuse and at first reporting suicidal ideations with a plan to cut his throat.  Patient reports that he was here a few days ago and was doing good after he left from here and remained sober until Friday.  He states that he was talking to his ex on Facebook and he has been told that he is not allowed to see his children unless he gives her some Massiel Stipp.  He states that he is supposed to have 50-50 custody order but she is not allowing him to see them.  He reports that he has been taking his medications sporadically but has not been consistent with his home medications.  He reports that he relapsed on alcohol on Friday and is drinking approximately 2-3 40s a day.  He also reports using meth on Friday but only used that 1 day and has not used since.  Patient is wanting to get into substance abuse treatment because he still lives at an [place] house.  He is requesting a detox facility.  He reports that he can be safe if he can be admitted to a detox facility. Social work was consulted and [facility] however [facility] not [facility] yet.  We will admit patient to the unit while waiting on referral approval and restart patient on medications. Patient has a long history of alcohol abuse with frequent ED visits for suicidal ideations, depression, and complications due to his alcohol abuse.  Patient has had multiple admissions in the past as well.  Patient also reported having a 40 this morning before presenting to the facility.  Stay Summary: Patient remained in the assessment room and was sleeping. Patient agreed  to discharge to [facility] in [place]. He denies any suicidal or homicidal ideations and denies any hallucinations. Patient is provided with 7 day samples and 30 day prescriptions of his home medications for admission. Patient will be transported via safe transport.  Total Time spent with patient: 30 minutes  Past Psychiatric History: alcohol dependence, MDD, multiple hospitalizations  Past Medical History:  Past Medical History:  Diagnosis Date   Depression    Prostate enlargement     Past Surgical History:  Procedure Laterality Date   EYE SURGERY     WRIST SURGERY Right    Family History:  Family History  Family history unknown: Yes   Family Psychiatric History: None reported Social History:  Social History   Substance and Sexual Activity  Alcohol Use No     Social History   Substance and Sexual Activity  Drug Use No    Social History   Socioeconomic History   Marital status: Single    Spouse name: Not on file   Number of children: Not on file   Years of education: Not on file   Highest education level: Not on file  Occupational History   Not on file  Tobacco Use   Smoking status: Never   Smokeless tobacco: Current    Types: Chew  Substance and Sexual Activity   Alcohol use: No   Drug use: No   Sexual activity: Yes  Birth control/protection: None  Other Topics Concern   Not on file  Social History Narrative   Not on file   Social Determinants of Health   Financial Resource Strain: Not on file  Food Insecurity: Not on file  Transportation Needs: Not on file  Physical Activity: Not on file  Stress: Not on file  Social Connections: Not on file   SDOH:  SDOH Screenings   Alcohol Screen: Not on file  Depression (PHQ2-9): Medium Risk   PHQ-2 Score: 27  Financial Resource Strain: Not on file  Food Insecurity: Not on file  Housing: Not on file  Physical Activity: Not on file  Social Connections: Not on file  Stress: Not on file  Tobacco Use: High  Risk   Smoking Tobacco Use: Never   Smokeless Tobacco Use: Current  Transportation Needs: Not on file    Tobacco Cessation:  A prescription for an FDA-approved tobacco cessation medication was offered at discharge and the patient refused  Current Medications:  Current Facility-Administered Medications  Medication Dose Route Frequency Provider Last Rate Last Admin   acetaminophen (TYLENOL) tablet 650 mg  650 mg Oral Q6H PRN Olusegun Gerstenberger, Gerlene Burdock, FNP       alum & mag hydroxide-simeth (MAALOX/MYLANTA) 200-200-20 MG/5ML suspension 30 mL  30 mL Oral Q4H PRN Tanner Yeley, Feliz Beam B, FNP       ARIPiprazole (ABILIFY) tablet 5 mg  5 mg Oral Daily Carrissa Taitano, Gerlene Burdock, FNP       escitalopram (LEXAPRO) tablet 20 mg  20 mg Oral Daily Jocelin Schuelke, Feliz Beam B, FNP       hydrOXYzine (ATARAX/VISTARIL) tablet 25 mg  25 mg Oral Q6H PRN Mykira Hofmeister, Gerlene Burdock, FNP       [START ON 09/30/2020] levothyroxine (SYNTHROID) tablet 25 mcg  25 mcg Oral Q0600 Abed Schar, Gerlene Burdock, FNP       loperamide (IMODIUM) capsule 2-4 mg  2-4 mg Oral PRN Theressa Piedra, Gerlene Burdock, FNP       LORazepam (ATIVAN) tablet 1 mg  1 mg Oral Q6H PRN Javian Nudd, Gerlene Burdock, FNP       magnesium hydroxide (MILK OF MAGNESIA) suspension 30 mL  30 mL Oral Daily PRN Shaheen Mende, Gerlene Burdock, FNP       multivitamin with minerals tablet 1 tablet  1 tablet Oral Daily Tsering Leaman, Feliz Beam B, FNP       ondansetron (ZOFRAN-ODT) disintegrating tablet 4 mg  4 mg Oral Q6H PRN Kamil Hanigan, Gerlene Burdock, FNP       thiamine (B-1) injection 100 mg  100 mg Intramuscular Once Lilymae Swiech, Gerlene Burdock, FNP       [START ON 09/30/2020] thiamine tablet 100 mg  100 mg Oral Daily Aaliyan Brinkmeier, Gerlene Burdock, FNP       traZODone (DESYREL) tablet 100 mg  100 mg Oral QHS PRN Winston Misner, Gerlene Burdock, FNP       Current Outpatient Medications  Medication Sig Dispense Refill   ARIPiprazole (ABILIFY) 5 MG tablet Take 1 tablet (5 mg total) by mouth daily. 30 tablet 0   escitalopram (LEXAPRO) 10 MG tablet Take 2 tablets (20 mg total) by mouth daily. 60 tablet 0   famotidine (PEPCID)  20 MG tablet Take 1 tablet (20 mg total) by mouth 2 (two) times daily. 60 tablet 0   levothyroxine (SYNTHROID) 25 MCG tablet Take 1 tablet (25 mcg total) by mouth daily before breakfast. 30 tablet 0   traZODone (DESYREL) 100 MG tablet Take 1 tablet (100 mg total) by mouth at bedtime as needed for sleep.  30 tablet 0    PTA Medications: (Not in a hospital admission)   Musculoskeletal  Strength & Muscle Tone: within normal limits Gait & Station: normal Patient leans: N/A  Psychiatric Specialty Exam  Presentation  General Appearance: Appropriate for Environment; Disheveled  Eye Contact:Fair  Speech:Clear and Coherent; Normal Rate  Speech Volume:Normal  Handedness:Right   Mood and Affect  Mood:Depressed  Affect:Appropriate; Congruent   Thought Process  Thought Processes:Coherent  Descriptions of Associations:Intact  Orientation:Full (Time, Place and Person)  Thought Content:WDL  Diagnosis of Schizophrenia or Schizoaffective disorder in past: No    Hallucinations:Hallucinations: None  Ideas of Reference:None  Suicidal Thoughts:Suicidal Thoughts: No  Homicidal Thoughts:Homicidal Thoughts: No   Sensorium  Memory:Immediate Good; Recent Good; Remote Good  Judgment:Fair  Insight:Good   Executive Functions  Concentration:Good  Attention Span:Good  Recall:Good  Fund of Knowledge:Good  Language:Good   Psychomotor Activity  Psychomotor Activity:Psychomotor Activity: Normal   Assets  Assets:Communication Skills; Desire for Improvement; Housing; Physical Health; Social Support   Sleep  Sleep:Sleep: Good   No data recorded  Physical Exam  Physical Exam Vitals and nursing note reviewed.  Constitutional:      Appearance: He is well-developed.  HENT:     Head: Normocephalic.  Eyes:     Pupils: Pupils are equal, round, and reactive to light.  Cardiovascular:     Rate and Rhythm: Normal rate.  Pulmonary:     Effort: Pulmonary effort is normal.   Musculoskeletal:        General: Normal range of motion.  Neurological:     Mental Status: He is alert and oriented to person, place, and time.   Review of Systems  Constitutional: Negative.   HENT: Negative.    Eyes: Negative.   Respiratory: Negative.    Cardiovascular: Negative.   Gastrointestinal: Negative.   Genitourinary: Negative.   Musculoskeletal: Negative.   Skin: Negative.   Neurological: Negative.   Endo/Heme/Allergies: Negative.   Psychiatric/Behavioral:  Positive for depression and substance abuse.   Blood pressure 97/73, pulse (!) 102, temperature 98.3 F (36.8 C), temperature source Oral, resp. rate 16, SpO2 97 %. There is no height or weight on file to calculate BMI.  Demographic Factors:  Male, Divorced or widowed, Caucasian, and Low socioeconomic status  Loss Factors: NA  Historical Factors: NA  Risk Reduction Factors:   Sense of responsibility to family, Living with another person, especially a relative, Positive social support, and Positive therapeutic relationship  Continued Clinical Symptoms:  Alcohol/Substance Abuse/Dependencies Previous Psychiatric Diagnoses and Treatments  Cognitive Features That Contribute To Risk:  None    Suicide Risk:  Mild:  Suicidal ideation of limited frequency, intensity, duration, and specificity.  There are no identifiable plans, no associated intent, mild dysphoria and related symptoms, good self-control (both objective and subjective assessment), few other risk factors, and identifiable protective factors, including available and accessible social support.  Plan Of Care/Follow-up recommendations:  Continue activity as tolerated. Continue diet as recommended by your PCP. Ensure to keep all appointments with outpatient providers.   Disposition: Discharge to [facility] in [place], FNP 09/29/2020, 3:03 PM

## 2020-09-29 NOTE — Discharge Instructions (Addendum)
Patient is instructed prior to discharge to: Take all medications as prescribed by his/her mental healthcare provider. Report any adverse effects and or reactions from the medicines to his/her outpatient provider promptly. Patient has been instructed & cautioned: To not engage in alcohol and or illegal drug use while on prescription medicines. In the event of worsening symptoms, patient is instructed to call the crisis hotline, 911 and or go to the nearest ED for appropriate evaluation and treatment of symptoms. To follow-up with his/her primary care provider for your other medical issues, concerns and or health care needs.     [facility] - Admissions are currently completed Monday through Friday at 8am; both appointments and walk-ins are accepted.  Any individual that is a [place] resident may present for a substance abuse screening and assessment for admission.  A person may be referred by numerous sources or self-refer.   Potential clients will be screened for medical necessity and appropriateness for the program.  Clients must meet criteria for high-intensity residential treatment services.  If clinically appropriate, a client will continue with the comprehensive clinical assessment and intake process, as well as enrollment in the [facility].  Address: [address] [place], [place] [address] Admin Hours: Mon-Fri 8AM to 5PM Center Hours: 24/7 Phone: [phone] Fax: [phone]  [facility]) Facility Based Crisis:  These are 3 locations for services: Please call before arrival   Address: [address] [place], [place] [address] Phone: [phone]  Address: [address], [place], [place] [address] Phone#: [phone]  Address: [address], [place], [place] [address] Phone#: [phone]   [facility]): (offers outpatient therapy and intensive outpatient substance abuse therapy).  [address], [place], [place] [address] Phone: [phone]  [facility]: Offers FREE recovery skills classes, support groups, 1:1 Peer Support, and Compeer Classes. [address], [place], [place] [address] Phone: [phone] (Call to complete intake).  [facility] [address] [place], [place] [address] Phone: [phone] ext: [phone] [facility] provides food, shelter and other programs and services to the homeless men of [employer] through our men's program.  By offering safe shelter, three meals a day, clean clothing, Biblical counseling, financial planning, vocational training, GED/education and employment assistance, we've helped mend the shattered lives of many homeless men since opening in 1974.  We have approximately 267 beds available, with a max of 312 beds including mats for emergency situations and currently house an average of 270 men a night.  Prospective Client Check-In Information Photo ID Required (State/ Out of State/ DOC) - if photo ID is not available, clients are required to have a printout of a police/sheriff's criminal history report. Help out with chores around the Mission. No sex offender of any type (pending, charged, registered and/or any other sex related offenses) will be permitted to check in. Must be willing to abide by all rules, regulations, and policies established by the [facility]. The following will be provided - shelter, food, clothing, and biblical counseling. If you or someone you know is in need of assistance at our men's shelter in [place], , please call [phone] ext. [phone].  [facility]-will provide timely access to mental health services for children and adolescents (4-17) and adults presenting in a mental health crisis. The program is designed for those who need urgent Behavioral Health or Substance Use   treatment and are not  experiencing a medical crisis that would typically require an emergency room visit.    [address] [place], [place] [address] Phone: [phone] [email or web address]  [facility]: Phone#: [phone]  The [facility] SA Intensive Outpatient Program (SAIOP) means structured individual and group addiction activities and services that are provided at an outpatient program designed to assist adult and adolescent consumers to begin recovery and learn skills for recovery maintenance. The [facility] SAIOP program is offered at least 3 hours a day, 3 days a week.SAIOP services shall include a structured program consisting of, but not limited to, the following services: Individual counseling and support; Group counseling and support; Family counseling, training or support; Biochemical assays to identify recent drug use (e.g., urine drug screens); Strategies for relapse prevention to include community and social support systems in treatment; Life skills; Crisis contingency planning; Disease Management; and Treatment support activities that have been adapted or specifically designed for persons with physical disabilities, or persons with co-occurring disorders of mental illness and substance abuse/dependence or mental retardation/developmental disability and substance abuse/dependence. Phone: [phone]  Address:   [facility] [place] [facility] will also offer the following outpatient services: (Monday through Friday 8am-5pm)   Partial Hospitalization Program (PHP) Substance Abuse Intensive Outpatient Program (SA-IOP) Group Therapy Medication Management Peer Living Room We also provide (24/7):  Assessments: Our mental health clinician and providers will conduct a focused mental health evaluation, assessing for immediate safety concerns and further mental health needs. Referral: Our team will provide resources and help connect to community based mental health  treatment, when indicated, including psychotherapy, psychiatry, and other specialized behavioral health or substance use disorder services (for those not already in treatment). Transitional Care: Our team providers in person bridging and/or telephonic follow-up during the patient's transition to outpatient services.  [facility] 24-Hour Call Center: 1-800-256-2452 Behavioral Health Crisis Line: [phone]  

## 2020-09-29 NOTE — BH Assessment (Signed)
Comprehensive Clinical Assessment (CCA) Note  09/29/2020 Ian Munoz [id number]  Disposition: Per Reola Calkins, NP, patient is psychiatrically cleared and discharged to [facility] in [place].   Flowsheet Row ED from 09/29/2020 in [facility] ED from 09/21/2020 in [facility] ED from 08/29/2020 in [facility]  C-SSRS RISK CATEGORY High Risk Low Risk Moderate Risk      The patient demonstrates the following risk factors for suicide: Chronic risk factors for suicide include: psychiatric disorder of MDD, substance use disorder, and previous suicide attempts x1 via OD . Acute risk factors for suicide include: family or marital conflict. Protective factors for this patient include: positive social support, positive therapeutic relationship, and responsibility to others (children, family). Considering these factors, the overall suicide risk at this point appears to be moderate. Patient is appropriate for outpatient follow up.   Ian Munoz is a 40 year old male presenting to [facility] voluntarily with [employer] due to suicidal ideations with a plan to cut his throat. Patient reports talking with his doctor this morning who suggested he call 911 because of suicidal ideation which has been constant for about a week. Patient reports stressors related to his continuous alcohol use and not knowing what is going on with his son. Patient reports that his son lives with his ex-girlfriend however he does not know where they are or have any contact with them.  Patient reports drinking four 40-ounces beers daily for the past week and he reports drinking 40oz of beer today. Patient reports he is drinking because of his "mental status" and reports having worsening depression. Patient currently living in an [facility] house and per chart review patient has diagnosis of MDD and alcohol abuse.   Patient is oriented to person, place and situation.  Patient eye contact is normal, he is engaged alert and cooperative during assessment. Patient reports SI with a plan to cut his throat with a knife that he left at home. Patient reports he has attempted suicide x1 by overdosing on NoDoz. Patient does not contract for safety for TTS however states "I want to go to [facility]". Patient contacts for safety with provider. Patient denies HI, AVH and SIB.   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   CCA Screening, Triage and Referral (STR)  Patient Reported Information How did you hear about [name]? Self  What Is the Reason for Your Visit/Call Today? SI with plan to cut throat  How Long Has This Been Causing You Problems? <Week  What Do You Feel Would Help You the Most Today? Treatment for Depression or other mood problem; Alcohol or Drug Use Treatment   Have You Recently Had Any Thoughts About Hurting Yourself? Yes  Are You Planning to Commit Suicide/Harm Yourself At This time? Yes   Have you Recently Had Thoughts About Hurting Someone [name]? No  Are You Planning to Harm Someone at This Time? No  Explanation: No data recorded  Have You Used Any Alcohol or Drugs in the Past 24 Hours? Yes  How Long Ago Did You Use Drugs or Alcohol? No data recorded What Did You Use and How Much? 40oz beer   Do You Currently Have a Therapist/Psychiatrist? No  Name of Therapist/Psychiatrist: No data recorded  Have You Been Recently Discharged From Any Office Practice or Programs? No  Explanation of Discharge  From Practice/Program: [name] in February 2022 due to financial reasons     CCA Screening Triage Referral Assessment Type of Contact: Face-to-Face  Telemedicine Service Delivery:   Is this Initial or Reassessment? Initial Assessment  Date Telepsych consult ordered in [facility]:   08/22/20  Time Telepsych consult ordered in [facility]:  No data recorded Location of Assessment: [facility] Services  Provider Location: No data recorded  Collateral Involvement: NA   Does Patient Have a Court Appointed Legal Guardian? No data recorded Name and Contact of Legal Guardian: No data recorded If Minor and Not Living with Parent(s), Who has Custody? n/a  Is CPS involved or ever been involved? Never  Is APS involved or ever been involved? Never   Patient Determined To Be At Risk for Harm To Self or Others Based on Review of Patient Reported Information or Presenting Complaint? Yes, for Self-Harm  Method: No data recorded Availability of Means: No data recorded Intent: No data recorded Notification Required: No data recorded Additional Information for Danger to Others Potential: No data recorded Additional Comments for Danger to Others Potential: No data recorded Are There Guns or Other Weapons in Your Home? No data recorded Types of Guns/Weapons: No data recorded Are These Weapons Safely Secured?                            No data recorded Who Could Verify You Are Able To Have These Secured: No data recorded Do You Have any Outstanding Charges, Pending Court Dates, Parole/Probation? No data recorded Contacted To Inform of Risk of Harm To Self or Others: No data recorded   Does Patient Present under Involuntary Commitment? No  IVC Papers Initial File Date: No data recorded  [place] of Residence: [place]   Patient Currently Receiving the Following Services: Medication Management   Determination of Need: Emergent (2 hours)   Options For Referral: Intensive Outpatient Therapy     CCA Biopsychosocial Patient Reported Schizophrenia/Schizoaffective Diagnosis in Past: No   Strengths: pt wants to feel better; reaching out for psychiatric stabilization   Mental Health Symptoms Depression:   Hopelessness; Change in energy/activity; Sleep (too much or  little); Increase/decrease in appetite; Irritability; Difficulty Concentrating; Fatigue; Weight gain/loss; Tearfulness; Worthlessness   Duration of Depressive symptoms:    Mania:   None   Anxiety:    Worrying; Tension   Psychosis:   None   Duration of Psychotic symptoms:    Trauma:   None   Obsessions:   None   Compulsions:   N/A   Inattention:   None   Hyperactivity/Impulsivity:   N/A   Oppositional/Defiant Behaviors:   None   Emotional Irregularity:   Mood lability; Potentially harmful impulsivity   Other Mood/Personality Symptoms:   '''I came in because I was scared that I would hurt myself or someone else''    Mental Status Exam Appearance and self-care  Stature:   Average   Weight:   Average weight   Clothing:   Casual   Grooming:   Normal   Cosmetic use:   None   Posture/gait:   Slumped   Motor activity:   Slowed   Sensorium  Attention:   Normal   Concentration:   Normal   Orientation:   X5   Recall/memory:   Normal   Affect and Mood  Affect:   Blunted; Depressed   Mood:   Depressed   Relating  Eye contact:   Normal  Facial expression:   Depressed; Responsive   Attitude toward examiner:   Cooperative   Thought and Language  Speech flow:  Slurred   Thought content:   Appropriate to Mood and Circumstances   Preoccupation:   None   Hallucinations:   Visual; Auditory   Organization:  No data recorded  Affiliated Computer ServicesExecutive Functions  Fund of Knowledge:   Average   Intelligence:   Average   Abstraction:   Functional   Judgement:   Impaired   Reality Testing:   Variable   Insight:   Gaps   Decision Making:   Paralyzed   Social Functioning  Social Maturity:   Isolates   Social Judgement:   Normal; "Street Smart"   Stress  Stressors:   Armed forces operational officerLegal; Surveyor, quantityinancial; Family conflict (Upcoming court date for injury to personal property 5/16)   Coping Ability:   Overwhelmed; Deficient supports   Skill  Deficits:   Responsibility; Self-control   Supports:   Family Systems analyst([name] alum)     Religion: Religion/Spirituality Are You A Religious Person?: Yes How Might This Affect Treatment?: baptist--should not affect tx  Leisure/Recreation: Leisure / Recreation Do You Have Hobbies?: Yes Leisure and Hobbies: hunting/fishing  Exercise/Diet: Exercise/Diet Do You Exercise?: Yes What Type of Exercise Do You Do?: Run/Walk How Many Times a Week Do You Exercise?: 6-7 times a week Have You Gained or Lost A Significant Amount of Weight in the Past Six Months?: Yes-Lost Number of Pounds Lost?: 10 Do You Follow a Special Diet?: No Do You Have Any Trouble Sleeping?: Yes Explanation of Sleeping Difficulties: Pt endorsed episodes of insomnia   CCA Employment/Education Employment/Work Situation: Employment / Work Situation Employment Situation: Unemployed Patient's Job has Been Impacted by Current Illness: Yes Describe how Patient's Job has Been Impacted: drinking at work 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, 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[employer])  Education: Education Last Grade Completed: 12 Did You Product managerAttend College?: Yes What Type of College Degree Do you Have?: HVAC program incomplete Did You Have An Individualized Education Program (IIEP): No Did You Have Any Difficulty At School?: No   CCA Family/Childhood History Family and Relationship History: Family history Marital status: Divorced Divorced, when?: 2019 finalized What types of issues is patient dealing with in the relationship?: clt told wife was in recovery but didnt want to give up drinking 'gotta stop diong something to recover' major relapse while working in [employer] went to strip club then got DUI (3rd) Additional relationship information: clt reports wife did not allow contact with biological son for 3 years Does patient have children?: Yes How many children?: 1 How is patient's relationship with their children?:  Distant  Childhood History:  Childhood History By whom was/is the patient raised?: Mother, Mother/father and step-parent Description of patient's current relationship with siblings: get along with step brother, hardly talk with sister 'we've grown apart..it bothers me' Did patient suffer any verbal/emotional/physical/sexual abuse as a child?: No Did patient suffer from severe childhood neglect?: No Has patient ever been sexually abused/assaulted/raped as an adolescent or adult?: No Was the patient ever a victim of a crime or a disaster?: No Witnessed domestic violence?: No Has patient been affected by domestic violence as an adult?: No  Child/Adolescent Assessment:     CCA Substance Use Alcohol/Drug Use: Alcohol / Drug Use Pain Medications: Please see MAR Prescriptions: Please see MAR Over the Counter: Please see MAR History of alcohol / drug use?: Yes Longest period of sobriety (when/how long): 30 days feb 2021; 1 year  2017 per clt Negative Consequences of Use: Financial, Legal, Personal relationships, Work / School Withdrawal Symptoms: Tremors Substance #1 Name of Substance 1: Alcohol 1 - Amount (size/oz): 4-40oz beer 1 - Frequency: Daily 1 - Duration: Ongoing 1 - Last Use / Amount: 09/29/20-40oz beer 1 - Method of Aquiring: Purchase Substance #2 Name of Substance 2: Meth 2 - Age of First Use: 16 2 - Amount (size/oz): Varied 2 - Frequency: Episodic 2 - Duration: Not sure 2 - Last Use / Amount: 08/06/2020 -- ''as much as I needed''                     ASAM's:  Six Dimensions of Multidimensional Assessment  Dimension 1:  Acute Intoxication and/or Withdrawal Potential:   Dimension 1:  Description of individual's past and current experiences of substance use and withdrawal: Multiple relapses; has been treated at [facility]  Dimension 2:  Biomedical Conditions and Complications:   Dimension 2:  Description of patient's biomedical conditions and  complications: None  indicated  Dimension 3:  Emotional, Behavioral, or Cognitive Conditions and Complications:  Dimension 3:  Description of emotional, behavioral, or cognitive conditions and complications: Depressed  Dimension 4:  Readiness to Change:  Dimension 4:  Description of Readiness to Change criteria: appears ready to change however hx of multiple relapses despite desire verbalized to change; listed multiple excuses when discussing possible start date  Dimension 5:  Relapse, Continued use, or Continued Problem Potential:  Dimension 5:  Relapse, continued use, or continued problem potential critiera description: frequent relapsing client reports most often around 30 days sober, recently hanging out with friends in a bar  Dimension 6:  Recovery/Living Environment:  Dimension 6:  Recovery/Iiving environment criteria description: Lives with roommate  ASAM Severity Score: ASAM's Severity Rating Score: 20  ASAM Recommended Level of Treatment: ASAM Recommended Level of Treatment: Level II Partial Hospitalization Treatment   Substance use Disorder (SUD) Substance Use Disorder (SUD)  Checklist Symptoms of Substance Use: Continued use despite having a persistent/recurrent physical/psychological problem caused/exacerbated by use, Continued use despite persistent or recurrent social, interpersonal problems, caused or exacerbated by use, Evidence of tolerance, Large amounts of time spent to obtain, use or recover from the substance(s), Persistent desire or unsuccessful efforts to cut down or control use, Presence of craving or strong urge to use, Recurrent use that results in a failure to fulfill major role obligations (work, school, home), Social, occupational, recreational activities given up or reduced due to use  Recommendations for Services/Supports/Treatments: Recommendations for Services/Supports/Treatments Recommendations For Services/Supports/Treatments: Individual Therapy, SAIOP (Substance Abuse Intensive  Outpatient Program)  Discharge Disposition:    DSM5 Diagnoses: Patient Active Problem List   Diagnosis Date Noted   Suicidal ideation    Homicidal ideation    Alcohol abuse with intoxication (HCC)    Hypothyroidism 09/17/2020   Gastroesophageal reflux disease without esophagitis 09/17/2020   Alcohol use disorder, severe, dependence (HCC) 08/07/2020   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

## 2020-09-29 NOTE — BH Assessment (Addendum)
Pt to [facility] voluntarily with chief complaint of SI with a plan to cut his throat. Pt reports he has method (I left my knife at the house). Pt does not contract safety. Pt reports stressors of ETOH use (4-40oz daily for past week). Pt denies HI, AVH. Pt is emergent. Pt requesting help with getting into [facility], however, he is willing to take any resources available

## 2020-09-29 NOTE — Progress Notes (Addendum)
CSW faxed out referral to [facility] at the request of the provider in reference to the patient asking to be connected to detox services. CSW spoke with Alcario Drought, RN with [facility] who reported that they had one male bed available at this time. Further, Courney with [facility] advised that they currently have no available beds, and Mark from [facility] ([facility]) advised that the patient could try to contact [facility] Monday morning to speak with someone about seeking treatment. CSW provided the following resources to the patient listed below.   [facility] - Admissions are currently completed Monday through Friday at 8am; both appointments and walk-ins are accepted.  Any individual that is a [facility] resident may present for a substance abuse screening and assessment for admission.  A person may be referred by numerous sources or self-refer.   Potential clients will be screened for medical necessity and appropriateness for the program.  Clients must meet criteria for high-intensity residential treatment services.  If clinically appropriate, a client will continue with the comprehensive clinical assessment and intake process, as well as enrollment in the [facility].   Address: [address] [place], [place] [address] Admin Hours: Mon-Fri 8AM to [facility] Center Hours: 24/7 Phone: [phone] Fax: [phone]   [facility] (Detox) Facility Based Crisis: These are 3 locations for services: Please call before arrival   Address: [address] [place], [place] [address] Phone: [phone]   Address: [address] [place], [place] [address] Phone#: [phone]   Address: [address] [place], [place] [address] Phone#: [phone]     [facility]): (offers outpatient therapy and intensive outpatient substance abuse therapy). [address],  [place], [place] [address] Phone: [phone]   [facility]: Offers FREE recovery skills classes, support groups, 1:1 Peer Support, and Compeer Classes. [address] [place], [place] [address] Phone: [phone] (Call to complete intake). [facility] [address] [place], [place] [address] Phone: [phone] ext: [phone] [facility] provides food, shelter and other programs and services to the homeless men of [place] through our [employer].   By offering safe shelter, three meals a day, clean clothing, Biblical counseling, financial planning, vocational training, GED/education and employment assistance, we've helped mend the shattered lives of many homeless men since opening in 1974.   We have approximately 267 beds available, with a max of 312 beds including mats for emergency situations and currently house an average of 270 men a night.   Prospective Client Check-In Information Photo ID Required (State/ Out of State/ [facility]) - if photo ID is not available, clients are required to have a printout of a police/sheriff's criminal history report. Help out with chores around the [facility]. No sex offender of any type (pending, charged, registered and/or any other sex related offenses) will be permitted to check in. Must be willing to abide by all rules, regulations, and policies established by the [employer]. The following will be provided - shelter, food, clothing, and biblical counseling. If you or someone you know is in need of assistance at our [facility] shelter in [place], [place], please call [phone] ext. [phone].   [facility]-will provide timely access to mental health services for children and adolescents (4-17) and adults presenting in a mental health crisis. The program is designed for those who need urgent Behavioral  Health or Substance Use treatment and are not experiencing  a medical crisis that would typically require an emergency room visit.   [address] [place], [place] [address] Phone: [phone] [email or web address]   [facility]: Phone#: [phone]   The [facility] SA Intensive Outpatient Program (SAIOP) means structured individual and group addiction activities and services that are provided at an outpatient program designed to assist adult and adolescent consumers to begin recovery and learn skills for recovery maintenance. The [facility] SAIOP program is offered at least 3 hours a day, 3 days a week.SAIOP services shall include a structured program consisting of, but not limited to, the following services: Individual counseling and support; Group counseling and support; Family counseling, training or support; Biochemical assays to identify recent drug use (e.g., urine drug screens); Strategies for relapse prevention to include community and social support systems in treatment; Life skills; Crisis contingency planning; Disease Management; and Treatment support activities that have been adapted or specifically designed for persons with physical disabilities, or persons with co-occurring disorders of mental illness and substance abuse/dependence or mental retardation/developmental disability and substance abuse/dependence. Phone: [phone]  Address:   [facility] will also offer the following outpatient services: (Monday through Friday 8am-5pm)   Partial Hospitalization Program (PHP) Substance Abuse Intensive Outpatient Program (SA-IOP) Group Therapy Medication Management Peer Living Room We also provide (24/7): Assessments: Our mental health clinician and providers will conduct a focused mental health evaluation, assessing for immediate safety concerns and further mental health needs. Referral: Our team will provide resources and help connect to community based mental health treatment, when  indicated, including psychotherapy, psychiatry, and other specialized behavioral health or substance use disorder services (for those not already in treatment). Transitional Care: Our team providers in person bridging and/or telephonic follow-up during the patient's transition to outpatient services. [facility] 24-Hour Call Center: [phone] Behavioral Health Crisis Line: [phone]   Crissie Reese, MSW, LCSW-A, [place] Phone: [phone] Disposition/TOC

## 2020-09-29 NOTE — ED Provider Notes (Signed)
Behavioral Health Admission H&P [facility] & OBS)  Date: 09/29/20 Patient Name: Ian Munoz MRN: [id number] Chief Complaint:  Chief Complaint  Patient presents with  . Suicidal  . Alcohol Problem      Diagnoses:  Final diagnoses:  Alcohol use disorder, severe, dependence (HCC)  Substance induced mood disorder (HCC)  Hypothyroidism, unspecified type    HPI: Patient is a 40 year old male who presented to the [facility] reporting alcohol abuse and at first reporting suicidal ideations with a plan to cut his throat.  Patient reports that he was here a few days ago and was doing good after he left from here and remained sober until Friday.  He states that he was talking to his ex on Facebook and he has been told that he is not allowed to see his children unless he gives her some Osric Klopf.  He states that he is supposed to have 50-50 custody order but she is not allowing him to see them.  He reports that he has been taking his medications sporadically but has not been consistent with his home medications.  He reports that he relapsed on alcohol on Friday and is drinking approximately 2-3 40s a day.  He also reports using meth on Friday but only used that 1 day and has not used since.  Patient is wanting to get into substance abuse treatment because he still lives at an [facility].  He is requesting a detox facility.  He reports that he can be safe if he can be admitted to a detox facility. Social work was consulted and [facility] however [facility] not [facility] his [facility] yet.  We will admit patient to the unit while waiting on referral approval and restart patient on medications. Patient has a long history of alcohol abuse with frequent ED visits for suicidal ideations, depression, and complications due to his alcohol abuse.  Patient has had multiple admissions in the past as well.  Patient also reported having a 40 this morning before presenting to the facility.  PHQ  2-9:  [place] ED from 09/21/2020 in [facility] Office Visit from 09/17/2020 in [facility] ED from 08/06/2020 in [place]  Thoughts that you would be better off dead, or of hurting yourself in some way Nearly every day Several days More than half the days  PHQ-9 Total Score _0 Flowsheet Row ED from 09/29/2020 in [facility] ED from 09/21/2020 in [facility] ED from 08/29/2020 in [place] DEPT  C-SSRS RISK CATEGORY High Risk Low Risk Moderate Risk        Total Time spent with patient: 30 minutes  Musculoskeletal  Strength & Muscle Tone: within normal limits Gait & Station: normal Patient leans: N/A  Psychiatric Specialty Exam  Presentation General Appearance: Appropriate for Environment; Disheveled  Eye Contact:Fair  Speech:Clear and Coherent; Normal Rate  Speech Volume:Normal  Handedness:Right   Mood and Affect  Mood:Depressed  Affect:Appropriate; Congruent   Thought Process  Thought Processes:Coherent  Descriptions of Associations:Intact  Orientation:Full (Time, Place and Person)  Thought Content:WDL  Diagnosis of Schizophrenia or Schizoaffective disorder in past: No   Hallucinations:Hallucinations: None  Ideas of Reference:None  Suicidal Thoughts:Suicidal Thoughts: No  Homicidal Thoughts:Homicidal Thoughts: No   Sensorium  Memory:Immediate Good; Recent Good; Remote Good  Judgment:Fair  Insight:Good  Executive Functions  Concentration:Good  Attention Span:Good  Wright of Knowledge:Good  Language:Good   Psychomotor Activity  Psychomotor Activity:Psychomotor Activity: Normal   Assets  Assets:Communication Skills; Desire for Improvement; Housing; Physical Health; Social Support   Sleep  Sleep:Sleep: Good   No data recorded  Physical  Exam Vitals and nursing note reviewed.  Constitutional:      Appearance: He is well-developed.  HENT:     Head: Normocephalic.  Eyes:     Pupils: Pupils are equal, round, and reactive to light.  Cardiovascular:     Rate and Rhythm: Normal rate.  Pulmonary:     Effort: Pulmonary effort is normal.  Musculoskeletal:        General: Normal range of motion.  Neurological:     Mental Status: He is alert and oriented to person, place, and time.   Review of Systems  Constitutional: Negative.   HENT: Negative.    Eyes: Negative.   Respiratory: Negative.    Cardiovascular: Negative.   Gastrointestinal: Negative.   Genitourinary: Negative.   Musculoskeletal: Negative.   Skin: Negative.   Neurological: Negative.   Endo/Heme/Allergies: Negative.   Psychiatric/Behavioral:  Positive for depression and substance abuse.    Blood pressure 97/73, pulse (!) 102, temperature 98.3 F (36.8 C), temperature source Oral, resp. rate 16, SpO2 97 %. There is no height or weight on file to calculate BMI.  Past Psychiatric History: alcohol dependence, MDD, multiple hospitalizations   Is the patient at risk to self? Yes  Has the patient been a risk to self in the past 6 months? Yes .    Has the patient been a risk to self within the distant past? Yes   Is the patient a risk to others? No   Has the patient been a risk to others in the past 6 months? No   Has the patient been a risk to others within the distant past? No   Past Medical History:  Past Medical History:  Diagnosis Date  . Depression   . Prostate enlargement     Past Surgical History:  Procedure Laterality Date  . EYE SURGERY    . WRIST SURGERY Right     Family History:  Family History  Family history unknown: Yes    Social History:  Social History   Socioeconomic History  . Marital status: Single    Spouse name: Not on file  . Number of children: Not on file  . Years of education: Not on file  . Highest education  level: Not on file  Occupational History  . Not on file  Tobacco Use  . Smoking status: Never  . Smokeless tobacco: Current    Types: Chew  Substance and Sexual Activity  . Alcohol use: No  . Drug use: No  . Sexual activity: Yes    Birth control/protection: None  Other Topics Concern  . Not on file  Social History Narrative  . Not on file   Social Determinants of Health   Financial Resource Strain: Not on file  Food Insecurity: Not on file  Transportation Needs: Not on file  Physical Activity: Not on file  Stress: Not on file  Social Connections: Not on file  Intimate Partner Violence: Not on file    SDOH:  SDOH Screenings   Alcohol Screen: Not on file  Depression (PHQ2-9): Medium Risk  . PHQ-2 Score: 27  Financial Resource Strain: Not on file  Food Insecurity: Not on file  Housing: Not on file  Physical Activity: Not on file  Social Connections: Not on file  Stress: Not on file  Tobacco Use: High Risk  . Smoking Tobacco Use: Never  . Smokeless Tobacco Use: Current  Transportation Needs: Not on file    Last Labs:  Appointment on 09/23/2020  Component Date Value Ref Range Status  . Vit D, 25-Hydroxy 09/23/2020 36.4  30.0 - 100.0 ng/mL Final   Comment: Vitamin D deficiency has been defined by the [place] practice guideline as a level of serum 25-OH vitamin D less than 20 ng/mL (1,2). The Endocrine Society went on to further define vitamin D insufficiency as a level between 21 and 29 ng/mL (2). 1. IOM (Institute of Medicine). 2010. Dietary reference    intakes for calcium and D. [place]: The    [employer]. 2. Holick MF, Binkley Helmetta, Bischoff-Ferrari HA, et al.    Evaluation, treatment, and prevention of vitamin D    deficiency: an Endocrine Society clinical practice    guideline. JCEM. 2011 Jul; 96(7):1911-30.   Marland Kitchen TSH 09/23/2020 1.680  0.450 - 4.500 uIU/mL Final  . T4, Total 09/23/2020 6.1  4.5 -  12.0 ug/dL Final  . T3 Uptake Ratio 09/23/2020 24  24 - 39 % Final  . Free Thyroxine Index 09/23/2020 1.5  1.2 - 4.9 Final  . Cholesterol, Total 09/23/2020 260 (A) 100 - 199 mg/dL Final  . Triglycerides 09/23/2020 226 (A) 0 - 149 mg/dL Final  . HDL 09/23/2020 83  >39 mg/dL Final  . VLDL Cholesterol Cal 09/23/2020 40  5 - 40 mg/dL Final  . LDL Chol Calc (NIH) 09/23/2020 137 (A) 0 - 99 mg/dL Final  . Chol/HDL Ratio 09/23/2020 3.1  0.0 - 5.0 ratio Final   Comment:                                   T. Chol/HDL Ratio                                             Men  Women                               1/2 Avg.Risk  3.4    3.3                                   Avg.Risk  5.0    4.4                                2X Avg.Risk  9.6    7.1                                3X Avg.Risk 23.4   11.0   . Glucose 09/23/2020 107 (A) 65 - 99 mg/dL Final  . BUN 09/23/2020 10  6 - 20 mg/dL Final  . Creatinine, Ser 09/23/2020 0.79  0.76 - 1.27 mg/dL Final  . eGFR 09/23/2020 116  >59 mL/min/1.73 Final  . BUN/Creatinine Ratio 09/23/2020 13  9 - 20 Final  .  Sodium 09/23/2020 136  134 - 144 mmol/L Final  . Potassium 09/23/2020 4.8  3.5 - 5.2 mmol/L Final  . Chloride 09/23/2020 95 (A) 96 - 106 mmol/L Final  . Calcium 09/23/2020 9.5  8.7 - 10.2 mg/dL Final  . Total Protein 09/23/2020 7.4  6.0 - 8.5 g/dL Final  . Albumin 09/23/2020 4.6  4.0 - 5.0 g/dL Final  . Globulin, Total 09/23/2020 2.8  1.5 - 4.5 g/dL Final  . Albumin/Globulin Ratio 09/23/2020 1.6  1.2 - 2.2 Final  . Bilirubin Total 09/23/2020 0.4  0.0 - 1.2 mg/dL Final  . Alkaline Phosphatase 09/23/2020 121  44 - 121 IU/L Final  . AST 09/23/2020 44 (A) 0 - 40 IU/L Final  . WBC 09/23/2020 7.6  3.4 - 10.8 x10E3/uL Final  . RBC 09/23/2020 4.39  4.14 - 5.80 x10E6/uL Final  . Hemoglobin 09/23/2020 15.1  13.0 - 17.7 g/dL Final  . Hematocrit 09/23/2020 44.3  37.5 - 51.0 % Final  . MCV 09/23/2020 101 (A) 79 - 97 fL Final  . MCH 09/23/2020 34.4 (A) 26.6 - 33.0 pg  Final  . MCHC 09/23/2020 34.1  31.5 - 35.7 g/dL Final  . RDW 09/23/2020 12.1  11.6 - 15.4 % Final  . Platelets 09/23/2020 290  150 - 450 x10E3/uL Final  . Neutrophils 09/23/2020 74  Not Estab. % Final  . Lymphs 09/23/2020 18  Not Estab. % Final  . Monocytes 09/23/2020 7  Not Estab. % Final  . Eos 09/23/2020 1  Not Estab. % Final  . Basos 09/23/2020 0  Not Estab. % Final  . Neutrophils Absolute 09/23/2020 5.6  1.4 - 7.0 x10E3/uL Final  . Lymphocytes Absolute 09/23/2020 1.4  0.7 - 3.1 x10E3/uL Final  . Monocytes Absolute 09/23/2020 0.5  0.1 - 0.9 x10E3/uL Final  . EOS (ABSOLUTE) 09/23/2020 0.1  0.0 - 0.4 x10E3/uL Final  . Basophils Absolute 09/23/2020 0.0  0.0 - 0.2 x10E3/uL Final  . Immature Granulocytes 09/23/2020 0  Not Estab. % Final  . Immature Grans (Abs) 09/23/2020 0.0  0.0 - 0.1 x10E3/uL Final  Admission on 09/21/2020, Discharged on 09/22/2020  Component Date Value Ref Range Status  . SARS Coronavirus 2 by RT PCR 09/21/2020 NEGATIVE  NEGATIVE Final   Comment: (NOTE) SARS-CoV-2 target nucleic acids are NOT DETECTED.  The SARS-CoV-2 RNA is generally detectable in upper respiratory specimens during the acute phase of infection. The lowest concentration of SARS-CoV-2 viral copies this assay can detect is 138 copies/mL. A negative result does not preclude SARS-Cov-2 infection and should not be used as the sole basis for treatment or other patient management decisions. A negative result may occur with  improper specimen collection/handling, submission of specimen other than nasopharyngeal swab, presence of viral mutation(s) within the areas targeted by this assay, and inadequate number of viral copies(<138 copies/mL). A negative result must be combined with clinical observations, patient history, and epidemiological information. The expected result is Negative.  Fact Sheet for Patients:  EntrepreneurPulse.com.au  Fact Sheet for Healthcare Providers:   IncredibleEmployment.be  This test is no                          t yet approved or cleared by the [place] FDA and  has been authorized for detection and/or diagnosis of SARS-CoV-2 by FDA under an Emergency Use Authorization (EUA). This EUA will remain  in effect (meaning this test can be used) for the duration of the COVID-19 declaration under  Section 564(b)(1) of the Act, 21 U.S.C.section 360bbb-3(b)(1), unless the authorization is terminated  or revoked sooner.      . Influenza A by PCR 09/21/2020 NEGATIVE  NEGATIVE Final  . Influenza B by PCR 09/21/2020 NEGATIVE  NEGATIVE Final   Comment: (NOTE) The Xpert Xpress SARS-CoV-2/FLU/RSV plus assay is intended as an aid in the diagnosis of influenza from Nasopharyngeal swab specimens and should not be used as a sole basis for treatment. Nasal washings and aspirates are unacceptable for Xpert Xpress SARS-CoV-2/FLU/RSV testing.  Fact Sheet for Patients: EntrepreneurPulse.com.au  Fact Sheet for Healthcare Providers: IncredibleEmployment.be  This test is not yet approved or cleared by the [place] FDA and has been authorized for detection and/or diagnosis of SARS-CoV-2 by FDA under an Emergency Use Authorization (EUA). This EUA will remain in effect (meaning this test can be used) for the duration of the COVID-19 declaration under Section 564(b)(1) of the Act, 21 U.S.C. section 360bbb-3(b)(1), unless the authorization is terminated or revoked.  Performed at [facility], [place] [address], [place], [place] [address]   . POC Amphetamine UR 09/21/2020 None Detected  NONE DETECTED (Cut Off Level 1000 ng/mL) Final  . POC Secobarbital (BAR) 09/21/2020 None Detected  NONE DETECTED (Cut Off Level 300 ng/mL) Final  . POC Buprenorphine (BUP) 09/21/2020 None Detected  NONE DETECTED (Cut Off Level 10 ng/mL) Final  . POC Oxazepam (BZO) 09/21/2020 None Detected  NONE DETECTED  (Cut Off Level 300 ng/mL) Final  . POC Cocaine UR 09/21/2020 None Detected  NONE DETECTED (Cut Off Level 300 ng/mL) Final  . POC Methamphetamine UR 09/21/2020 None Detected  NONE DETECTED (Cut Off Level 1000 ng/mL) Final  . POC Morphine 09/21/2020 None Detected  NONE DETECTED (Cut Off Level 300 ng/mL) Final  . POC Oxycodone UR 09/21/2020 None Detected  NONE DETECTED (Cut Off Level 100 ng/mL) Final  . POC Methadone UR 09/21/2020 None Detected  NONE DETECTED (Cut Off Level 300 ng/mL) Final  . POC Marijuana UR 09/21/2020 None Detected  NONE DETECTED (Cut Off Level 50 ng/mL) Final  . SARSCOV2ONAVIRUS 2 AG 09/21/2020 NEGATIVE  NEGATIVE Final   Comment: (NOTE) SARS-CoV-2 antigen NOT DETECTED.   Negative results are presumptive.  Negative results do not preclude SARS-CoV-2 infection and should not be used as the sole basis for treatment or other patient management decisions, including infection  control decisions, particularly in the presence of clinical signs and  symptoms consistent with COVID-19, or in those who have been in contact with the virus.  Negative results must be combined with clinical observations, patient history, and epidemiological information. The expected result is Negative.  Fact Sheet for Patients: HandmadeRecipes.com.cy  Fact Sheet for Healthcare Providers: FuneralLife.at  This test is not yet approved or cleared by the [place] FDA and  has been authorized for detection and/or diagnosis of SARS-CoV-2 by FDA under an Emergency Use Authorization (EUA).  This EUA will remain in effect (meaning this test can be used) for the duration of  the COV                          ID-19 declaration under Section 564(b)(1) of the Act, 21 U.S.C. section 360bbb-3(b)(1), unless the authorization is terminated or revoked sooner.    . Alcohol, Ethyl (B) 09/22/2020 157 (A) <10 mg/dL Final   Comment: (NOTE) Lowest detectable limit for  serum alcohol is 10 mg/dL.  For medical purposes only. Performed at [facility],  [address], [place], [place] [address]   Admission on 09/18/2020, Discharged on 09/18/2020  Component Date Value Ref Range Status  . SARS Coronavirus 2 by RT PCR 09/18/2020 NEGATIVE  NEGATIVE Final   Comment: (NOTE) SARS-CoV-2 target nucleic acids are NOT DETECTED.  The SARS-CoV-2 RNA is generally detectable in upper respiratory specimens during the acute phase of infection. The lowest concentration of SARS-CoV-2 viral copies this assay can detect is 138 copies/mL. A negative result does not preclude SARS-Cov-2 infection and should not be used as the sole basis for treatment or other patient management decisions. A negative result may occur with  improper specimen collection/handling, submission of specimen other than nasopharyngeal swab, presence of viral mutation(s) within the areas targeted by this assay, and inadequate number of viral copies(<138 copies/mL). A negative result must be combined with clinical observations, patient history, and epidemiological information. The expected result is Negative.  Fact Sheet for Patients:  EntrepreneurPulse.com.au  Fact Sheet for Healthcare Providers:  IncredibleEmployment.be  This test is no                          t yet approved or cleared by the [place] FDA and  has been authorized for detection and/or diagnosis of SARS-CoV-2 by FDA under an Emergency Use Authorization (EUA). This EUA will remain  in effect (meaning this test can be used) for the duration of the COVID-19 declaration under Section 564(b)(1) of the Act, 21 U.S.C.section 360bbb-3(b)(1), unless the authorization is terminated  or revoked sooner.      . Influenza A by PCR 09/18/2020 NEGATIVE  NEGATIVE Final  . Influenza B by PCR 09/18/2020 NEGATIVE  NEGATIVE Final   Comment: (NOTE) The Xpert Xpress SARS-CoV-2/FLU/RSV plus assay is  intended as an aid in the diagnosis of influenza from Nasopharyngeal swab specimens and should not be used as a sole basis for treatment. Nasal washings and aspirates are unacceptable for Xpert Xpress SARS-CoV-2/FLU/RSV testing.  Fact Sheet for Patients: EntrepreneurPulse.com.au  Fact Sheet for Healthcare Providers: IncredibleEmployment.be  This test is not yet approved or cleared by the [place] FDA and has been authorized for detection and/or diagnosis of SARS-CoV-2 by FDA under an Emergency Use Authorization (EUA). This EUA will remain in effect (meaning this test can be used) for the duration of the COVID-19 declaration under Section 564(b)(1) of the Act, 21 U.S.C. section 360bbb-3(b)(1), unless the authorization is terminated or revoked.  Performed at [facility], [place] [address], [place], [place] [address]   . Sodium 09/18/2020 141  135 - 145 mmol/L Final  . Potassium 09/18/2020 3.9  3.5 - 5.1 mmol/L Final  . Chloride 09/18/2020 111  98 - 111 mmol/L Final  . CO2 09/18/2020 22  22 - 32 mmol/L Final  . Glucose, Bld 09/18/2020 97  70 - 99 mg/dL Final   Glucose reference range applies only to samples taken after fasting for at least 8 hours.  . BUN 09/18/2020 9  6 - 20 mg/dL Final  . Creatinine, Ser 09/18/2020 0.81  0.61 - 1.24 mg/dL Final  . Calcium 09/18/2020 7.6 (A) 8.9 - 10.3 mg/dL Final  . Total Protein 09/18/2020 5.6 (A) 6.5 - 8.1 g/dL Final  . Albumin 09/18/2020 3.2 (A) 3.5 - 5.0 g/dL Final  . AST 09/18/2020 21  15 - 41 U/L Final  . ALT 09/18/2020 32  0 - 44 U/L Final  . Alkaline Phosphatase 09/18/2020 82  38 - 126 U/L Final  .  Total Bilirubin 09/18/2020 0.5  0.3 - 1.2 mg/dL Final  . GFR, Estimated 09/18/2020 >60  >60 mL/min Final   Comment: (NOTE) Calculated using the CKD-EPI Creatinine Equation (2021)   . Anion gap 09/18/2020 8  5 - 15 Final   Performed at [facility] [address], [place], [place] [address]   . Alcohol, Ethyl (B) 09/18/2020 211 (A) <10 mg/dL Final   Comment: (NOTE) Lowest detectable limit for serum alcohol is 10 mg/dL.  For medical purposes only. Performed at [facility], [place] [address] [place], [place] [address]   . Opiates 09/18/2020 NONE DETECTED  NONE DETECTED Final  . Cocaine 09/18/2020 NONE DETECTED  NONE DETECTED Final  . Benzodiazepines 09/18/2020 NONE DETECTED  NONE DETECTED Final  . Amphetamines 09/18/2020 NONE DETECTED  NONE DETECTED Final  . Tetrahydrocannabinol 09/18/2020 NONE DETECTED  NONE DETECTED Final  . Barbiturates 09/18/2020 NONE DETECTED  NONE DETECTED Final   Comment: (NOTE) DRUG SCREEN FOR MEDICAL PURPOSES ONLY.  IF CONFIRMATION IS NEEDED FOR ANY PURPOSE, NOTIFY LAB WITHIN 5 DAYS.  LOWEST DETECTABLE LIMITS FOR URINE DRUG SCREEN Drug Class                     Cutoff (ng/mL) Amphetamine and metabolites    1000 Barbiturate and metabolites    200 Benzodiazepine                 778 Tricyclics and metabolites     300 Opiates and metabolites        300 Cocaine and metabolites        300 THC                            50 Performed at [facility], [place] [address], [place], [place] [address]   . WBC 09/18/2020 4.8  4.0 - 10.5 K/uL Final  . RBC 09/18/2020 3.48 (A) 4.22 - 5.81 MIL/uL Final  . Hemoglobin 09/18/2020 12.4 (A) 13.0 - 17.0 g/dL Final  . HCT 09/18/2020 36.6 (A) 39.0 - 52.0 % Final  . MCV 09/18/2020 105.2 (A) 80.0 - 100.0 fL Final  . MCH 09/18/2020 35.6 (A) 26.0 - 34.0 pg Final  . MCHC 09/18/2020 33.9  30.0 - 36.0 g/dL Final  . RDW 09/18/2020 12.9  11.5 - 15.5 % Final  . Platelets 09/18/2020 231  150 - 400 K/uL Final  . nRBC 09/18/2020 0.0  0.0 - 0.2 % Final  . Neutrophils Relative % 09/18/2020 50  % Final  . Neutro Abs 09/18/2020 2.4  1.7 - 7.7 K/uL Final  . Lymphocytes Relative 09/18/2020 43  % Final  . Lymphs Abs 09/18/2020 2.1  0.7 - 4.0 K/uL Final  . Monocytes Relative 09/18/2020 6  % Final  . Monocytes Absolute  09/18/2020 0.3  0.1 - 1.0 K/uL Final  . Eosinophils Relative 09/18/2020 1  % Final  . Eosinophils Absolute 09/18/2020 0.0  0.0 - 0.5 K/uL Final  . Basophils Relative 09/18/2020 0  % Final  . Basophils Absolute 09/18/2020 0.0  0.0 - 0.1 K/uL Final  . Immature Granulocytes 09/18/2020 0  % Final  . Abs Immature Granulocytes 09/18/2020 0.01  0.00 - 0.07 K/uL Final   Performed at [facility], [place] [address], [place], [place] [address]  . Salicylate Lvl 31/54/0086 <7.0 (A) 7.0 - 30.0 mg/dL Final   Performed at [facility] [address],  [place], [place] [address]  . Acetaminophen (Tylenol), Serum 09/18/2020 <10 (A) 10 - 30 ug/mL Final   Comment: (NOTE) Therapeutic concentrations vary significantly. A range of 10-30 ug/mL  may be an effective concentration for many patients. However, some  are best treated at concentrations outside of this range. Acetaminophen concentrations >150 ug/mL at 4 hours after ingestion  and >50 ug/mL at 12 hours after ingestion are often associated with  toxic reactions.  Performed at [facility], [place] [address], [place], [place] [address]   . Color, Urine 09/18/2020 COLORLESS (A) YELLOW Final  . APPearance 09/18/2020 CLEAR  CLEAR Final  . Specific Gravity, Urine 09/18/2020 1.003 (A) 1.005 - 1.030 Final  . pH 09/18/2020 5.0  5.0 - 8.0 Final  . Glucose, UA 09/18/2020 NEGATIVE  NEGATIVE mg/dL Final  . Hgb urine dipstick 09/18/2020 NEGATIVE  NEGATIVE Final  . Bilirubin Urine 09/18/2020 NEGATIVE  NEGATIVE Final  . Ketones, ur 09/18/2020 NEGATIVE  NEGATIVE mg/dL Final  . Protein, ur 09/18/2020 NEGATIVE  NEGATIVE mg/dL Final  . Nitrite 09/18/2020 NEGATIVE  NEGATIVE Final  . Chalmers Guest 09/18/2020 NEGATIVE  NEGATIVE Final   Performed at [facility], [place] [address], [place], [place] [address]  Admission on 08/29/2020, Discharged on 08/30/2020  Component Date Value Ref Range Status  . SARS Coronavirus 2 by RT PCR 08/29/2020 NEGATIVE   NEGATIVE Final   Comment: (NOTE) SARS-CoV-2 target nucleic acids are NOT DETECTED.  The SARS-CoV-2 RNA is generally detectable in upper respiratory specimens during the acute phase of infection. The lowest concentration of SARS-CoV-2 viral copies this assay can detect is 138 copies/mL. A negative result does not preclude SARS-Cov-2 infection and should not be used as the sole basis for treatment or other patient management decisions. A negative result may occur with  improper specimen collection/handling, submission of specimen other than nasopharyngeal swab, presence of viral mutation(s) within the areas targeted by this assay, and inadequate number of viral copies(<138 copies/mL). A negative result must be combined with clinical observations, patient history, and epidemiological information. The expected result is Negative.  Fact Sheet for Patients:  EntrepreneurPulse.com.au  Fact Sheet for Healthcare Providers:  IncredibleEmployment.be  This test is no                          t yet approved or cleared by the [place] FDA and  has been authorized for detection and/or diagnosis of SARS-CoV-2 by FDA under an Emergency Use Authorization (EUA). This EUA will remain  in effect (meaning this test can be used) for the duration of the COVID-19 declaration under Section 564(b)(1) of the Act, 21 U.S.C.section 360bbb-3(b)(1), unless the authorization is terminated  or revoked sooner.      . Influenza A by PCR 08/29/2020 NEGATIVE  NEGATIVE Final  . Influenza B by PCR 08/29/2020 NEGATIVE  NEGATIVE Final   Comment: (NOTE) The Xpert Xpress SARS-CoV-2/FLU/RSV plus assay is intended as an aid in the diagnosis of influenza from Nasopharyngeal swab specimens and should not be used as a sole basis for treatment. Nasal washings and aspirates are unacceptable for Xpert Xpress SARS-CoV-2/FLU/RSV testing.  Fact Sheet for  Patients: EntrepreneurPulse.com.au  Fact Sheet for Healthcare Providers: IncredibleEmployment.be  This test is not yet approved or cleared by the [place] FDA and has been authorized for detection and/or diagnosis of SARS-CoV-2 by FDA under an Emergency Use Authorization (EUA). This EUA will remain in effect (meaning this test can be used) for  the duration of the COVID-19 declaration under Section 564(b)(1) of the Act, 21 U.S.C. section 360bbb-3(b)(1), unless the authorization is terminated or revoked.  Performed at [facility], [place] [address], [place], [place] [address]   . Sodium 08/29/2020 139  135 - 145 mmol/L Final  . Potassium 08/29/2020 3.7  3.5 - 5.1 mmol/L Final  . Chloride 08/29/2020 101  98 - 111 mmol/L Final  . CO2 08/29/2020 30  22 - 32 mmol/L Final  . Glucose, Bld 08/29/2020 100 (A) 70 - 99 mg/dL Final   Glucose reference range applies only to samples taken after fasting for at least 8 hours.  . BUN 08/29/2020 10  6 - 20 mg/dL Final  . Creatinine, Ser 08/29/2020 0.86  0.61 - 1.24 mg/dL Final  . Calcium 08/29/2020 8.9  8.9 - 10.3 mg/dL Final  . Total Protein 08/29/2020 8.4 (A) 6.5 - 8.1 g/dL Final  . Albumin 08/29/2020 4.5  3.5 - 5.0 g/dL Final  . AST 08/29/2020 21  15 - 41 U/L Final  . ALT 08/29/2020 60 (A) 0 - 44 U/L Final  . Alkaline Phosphatase 08/29/2020 91  38 - 126 U/L Final  . Total Bilirubin 08/29/2020 0.3  0.3 - 1.2 mg/dL Final  . GFR, Estimated 08/29/2020 >60  >60 mL/min Final   Comment: (NOTE) Calculated using the CKD-EPI Creatinine Equation (2021)   . Anion gap 08/29/2020 8  5 - 15 Final   Performed at [facility], [place] [address], [place], [place] [address]  . Alcohol, Ethyl (B) 08/29/2020 313 (A) <10 mg/dL Final   Comment: CRITICAL RESULT CALLED TO, READ BACK BY AND VERIFIED WITH: BANNO, RN @ [phone] ON 08/29/20 C VARNER (NOTE) Lowest detectable limit for serum alcohol is  10 mg/dL.  For medical purposes only. Performed at [facility], [place] [address], [place], [place] [address]   . Opiates 08/29/2020 NONE DETECTED  NONE DETECTED Final  . Cocaine 08/29/2020 NONE DETECTED  NONE DETECTED Final  . Benzodiazepines 08/29/2020 NONE DETECTED  NONE DETECTED Final  . Amphetamines 08/29/2020 NONE DETECTED  NONE DETECTED Final  . Tetrahydrocannabinol 08/29/2020 NONE DETECTED  NONE DETECTED Final  . Barbiturates 08/29/2020 NONE DETECTED  NONE DETECTED Final   Comment: (NOTE) DRUG SCREEN FOR MEDICAL PURPOSES ONLY.  IF CONFIRMATION IS NEEDED FOR ANY PURPOSE, NOTIFY LAB WITHIN 5 DAYS.  LOWEST DETECTABLE LIMITS FOR URINE DRUG SCREEN Drug Class                     Cutoff (ng/mL) Amphetamine and metabolites    1000 Barbiturate and metabolites    200 Benzodiazepine                 720 Tricyclics and metabolites     300 Opiates and metabolites        300 Cocaine and metabolites        300 THC                            50 Performed at [facility], [place] [address], [place],  [address]   . WBC 08/29/2020 10.1  4.0 - 10.5 K/uL Final  . RBC 08/29/2020 4.29  4.22 - 5.81 MIL/uL Final  . Hemoglobin 08/29/2020 14.9  13.0 - 17.0 g/dL Final  . HCT 08/29/2020 45.0  39.0 - 52.0 % Final  . MCV 08/29/2020 104.9 (A) 80.0 - 100.0 fL Final  .  MCH 08/29/2020 34.7 (A) 26.0 - 34.0 pg Final  . MCHC 08/29/2020 33.1  30.0 - 36.0 g/dL Final  . RDW 08/29/2020 13.5  11.5 - 15.5 % Final  . Platelets 08/29/2020 324  150 - 400 K/uL Final  . nRBC 08/29/2020 0.0  0.0 - 0.2 % Final  . Neutrophils Relative % 08/29/2020 58  % Final  . Neutro Abs 08/29/2020 5.7  1.7 - 7.7 K/uL Final  . Lymphocytes Relative 08/29/2020 37  % Final  . Lymphs Abs 08/29/2020 3.8  0.7 - 4.0 K/uL Final  . Monocytes Relative 08/29/2020 5  % Final  . Monocytes Absolute 08/29/2020 0.5  0.1 - 1.0 K/uL Final  . Eosinophils Relative 08/29/2020 0  % Final  . Eosinophils  Absolute 08/29/2020 0.0  0.0 - 0.5 K/uL Final  . Basophils Relative 08/29/2020 0  % Final  . Basophils Absolute 08/29/2020 0.0  0.0 - 0.1 K/uL Final  . Immature Granulocytes 08/29/2020 0  % Final  . Abs Immature Granulocytes 08/29/2020 0.03  0.00 - 0.07 K/uL Final   Performed at [facility], [place] [address], [place], [place] [address]  . Salicylate Lvl 36/64/4034 <7.0 (A) 7.0 - 30.0 mg/dL Final   Performed at [place] [address] [place], [place] [address]  . Acetaminophen (Tylenol), Serum 08/29/2020 <10 (A) 10 - 30 ug/mL Final   Comment: (NOTE) Therapeutic concentrations vary significantly. A range of 10-30 ug/mL  may be an effective concentration for many patients. However, some  are best treated at concentrations outside of this range. Acetaminophen concentrations >150 ug/mL at 4 hours after ingestion  and >50 ug/mL at 12 hours after ingestion are often associated with  toxic reactions.  Performed at [facility], [place] [address] [place], [place] [address]   . Glucose-Capillary 08/29/2020 119 (A) 70 - 99 mg/dL Final   Glucose reference range applies only to samples taken after fasting for at least 8 hours.  Admission on 08/21/2020, Discharged on 08/22/2020  Component Date Value Ref Range Status  . SARS Coronavirus 2 by RT PCR 08/21/2020 NEGATIVE  NEGATIVE Final   Comment: (NOTE) SARS-CoV-2 target nucleic acids are NOT DETECTED.  The SARS-CoV-2 RNA is generally detectable in upper respiratory specimens during the acute phase of infection. The lowest concentration of SARS-CoV-2 viral copies this assay can detect is 138 copies/mL. A negative result does not preclude SARS-Cov-2 infection and should not be used as the sole basis for treatment or other patient management decisions. A negative result may occur with  improper specimen collection/handling, submission of specimen other than nasopharyngeal swab, presence of  viral mutation(s) within the areas targeted by this assay, and inadequate number of viral copies(<138 copies/mL). A negative result must be combined with clinical observations, patient history, and epidemiological information. The expected result is Negative.  Fact Sheet for Patients:  EntrepreneurPulse.com.au  Fact Sheet for Healthcare Providers:  IncredibleEmployment.be  This test is no                          t yet approved or cleared by the [place] FDA and  has been authorized for detection and/or diagnosis of SARS-CoV-2 by FDA under an Emergency Use Authorization (EUA). This EUA will remain  in effect (meaning this test can be used) for the duration of the COVID-19 declaration under Section 564(b)(1) of the Act, 21 U.S.C.section 360bbb-3(b)(1), unless the authorization is terminated  or revoked sooner.      Marland Kitchen  Influenza A by PCR 08/21/2020 NEGATIVE  NEGATIVE Final  . Influenza B by PCR 08/21/2020 NEGATIVE  NEGATIVE Final   Comment: (NOTE) The Xpert Xpress SARS-CoV-2/FLU/RSV plus assay is intended as an aid in the diagnosis of influenza from Nasopharyngeal swab specimens and should not be used as a sole basis for treatment. Nasal washings and aspirates are unacceptable for Xpert Xpress SARS-CoV-2/FLU/RSV testing.  Fact Sheet for Patients: EntrepreneurPulse.com.au  Fact Sheet for Healthcare Providers: IncredibleEmployment.be  This test is not yet approved or cleared by the [place] FDA and has been authorized for detection and/or diagnosis of SARS-CoV-2 by FDA under an Emergency Use Authorization (EUA). This EUA will remain in effect (meaning this test can be used) for the duration of the COVID-19 declaration under Section 564(b)(1) of the Act, 21 U.S.C. section 360bbb-3(b)(1), unless the authorization is terminated or revoked.  Performed at [facility], [place]  [address], [place], [place] [address]   . Sodium 08/21/2020 136  135 - 145 mmol/L Final  . Potassium 08/21/2020 3.6  3.5 - 5.1 mmol/L Final  . Chloride 08/21/2020 103  98 - 111 mmol/L Final  . CO2 08/21/2020 21 (A) 22 - 32 mmol/L Final  . Glucose, Bld 08/21/2020 94  70 - 99 mg/dL Final   Glucose reference range applies only to samples taken after fasting for at least 8 hours.  . BUN 08/21/2020 11  6 - 20 mg/dL Final  . Creatinine, Ser 08/21/2020 0.82  0.61 - 1.24 mg/dL Final  . Calcium 08/21/2020 8.8 (A) 8.9 - 10.3 mg/dL Final  . Total Protein 08/21/2020 7.8  6.5 - 8.1 g/dL Final  . Albumin 08/21/2020 4.4  3.5 - 5.0 g/dL Final  . AST 08/21/2020 71 (A) 15 - 41 U/L Final  . ALT 08/21/2020 257 (A) 0 - 44 U/L Final  . Alkaline Phosphatase 08/21/2020 97  38 - 126 U/L Final  . Total Bilirubin 08/21/2020 0.5  0.3 - 1.2 mg/dL Final  . GFR, Estimated 08/21/2020 >60  >60 mL/min Final   Comment: (NOTE) Calculated using the CKD-EPI Creatinine Equation (2021)   . Anion gap 08/21/2020 12  5 - 15 Final   Performed at [facility], [place] [name] [place], [place] [address]  . Alcohol, Ethyl (B) 08/21/2020 263 (A) <10 mg/dL Final   Comment: (NOTE) Lowest detectable limit for serum alcohol is 10 mg/dL.  For medical purposes only. Performed at [facility], [place] [address], [place], [place] [address]   . Opiates 08/21/2020 NONE DETECTED  NONE DETECTED Final  . Cocaine 08/21/2020 NONE DETECTED  NONE DETECTED Final  . Benzodiazepines 08/21/2020 POSITIVE (A) NONE DETECTED Final  . Amphetamines 08/21/2020 NONE DETECTED  NONE DETECTED Final  . Tetrahydrocannabinol 08/21/2020 NONE DETECTED  NONE DETECTED Final  . Barbiturates 08/21/2020 NONE DETECTED  NONE DETECTED Final   Comment: (NOTE) DRUG SCREEN FOR MEDICAL PURPOSES ONLY.  IF CONFIRMATION IS NEEDED FOR ANY PURPOSE, NOTIFY LAB WITHIN 5 DAYS.  LOWEST DETECTABLE LIMITS FOR URINE DRUG SCREEN Drug Class                      Cutoff (ng/mL) Amphetamine and metabolites    1000 Barbiturate and metabolites    200 Benzodiazepine                 654 Tricyclics and metabolites     300 Opiates and metabolites        300 Cocaine and metabolites  300 THC                            50 Performed at [facility], [place] [address] [place], [place] [address]   . WBC 08/21/2020 8.6  4.0 - 10.5 K/uL Final  . RBC 08/21/2020 4.14 (A) 4.22 - 5.81 MIL/uL Final  . Hemoglobin 08/21/2020 14.3  13.0 - 17.0 g/dL Final  . HCT 08/21/2020 42.6  39.0 - 52.0 % Final  . MCV 08/21/2020 102.9 (A) 80.0 - 100.0 fL Final  . MCH 08/21/2020 34.5 (A) 26.0 - 34.0 pg Final  . MCHC 08/21/2020 33.6  30.0 - 36.0 g/dL Final  . RDW 08/21/2020 13.2  11.5 - 15.5 % Final  . Platelets 08/21/2020 281  150 - 400 K/uL Final  . nRBC 08/21/2020 0.0  0.0 - 0.2 % Final  . Neutrophils Relative % 08/21/2020 56  % Final  . Neutro Abs 08/21/2020 4.8  1.7 - 7.7 K/uL Final  . Lymphocytes Relative 08/21/2020 34  % Final  . Lymphs Abs 08/21/2020 2.9  0.7 - 4.0 K/uL Final  . Monocytes Relative 08/21/2020 7  % Final  . Monocytes Absolute 08/21/2020 0.6  0.1 - 1.0 K/uL Final  . Eosinophils Relative 08/21/2020 2  % Final  . Eosinophils Absolute 08/21/2020 0.2  0.0 - 0.5 K/uL Final  . Basophils Relative 08/21/2020 1  % Final  . Basophils Absolute 08/21/2020 0.1  0.0 - 0.1 K/uL Final  . Immature Granulocytes 08/21/2020 0  % Final  . Abs Immature Granulocytes 08/21/2020 0.02  0.00 - 0.07 K/uL Final   Performed at [facility], [place] [address] [place], [place] [address]  . Salicylate Lvl 04/08/7587 <7.0 (A) 7.0 - 30.0 mg/dL Final   Performed at [place] [address], [place], [place] [address]  . Acetaminophen (Tylenol), Serum 08/21/2020 <10 (A) 10 - 30 ug/mL Final   Comment: (NOTE) Therapeutic concentrations vary significantly. A range of 10-30 ug/mL  may be an effective concentration for  many patients. However, some  are best treated at concentrations outside of this range. Acetaminophen concentrations >150 ug/mL at 4 hours after ingestion  and >50 ug/mL at 12 hours after ingestion are often associated with  toxic reactions.  Performed at [facility], [place] [address], [place], [place] [address]   Admission on 08/06/2020, Discharged on 08/07/2020  Component Date Value Ref Range Status  . Sodium 08/06/2020 136  135 - 145 mmol/L Final  . Potassium 08/06/2020 4.0  3.5 - 5.1 mmol/L Final  . Chloride 08/06/2020 103  98 - 111 mmol/L Final  . CO2 08/06/2020 25  22 - 32 mmol/L Final  . Glucose, Bld 08/06/2020 89  70 - 99 mg/dL Final   Glucose reference range applies only to samples taken after fasting for at least 8 hours.  . BUN 08/06/2020 16  6 - 20 mg/dL Final  . Creatinine, Ser 08/06/2020 0.87  0.61 - 1.24 mg/dL Final  . Calcium 08/06/2020 8.7 (A) 8.9 - 10.3 mg/dL Final  . Total Protein 08/06/2020 6.8  6.5 - 8.1 g/dL Final  . Albumin 08/06/2020 3.8  3.5 - 5.0 g/dL Final  . AST 08/06/2020 138 (A) 15 - 41 U/L Final  . ALT 08/06/2020 383 (A) 0 - 44 U/L Final  . Alkaline Phosphatase 08/06/2020 108  38 - 126 U/L Final  . Total Bilirubin 08/06/2020 0.5  0.3 - 1.2  mg/dL Final  . GFR, Estimated 08/06/2020 >60  >60 mL/min Final   Comment: (NOTE) Calculated using the CKD-EPI Creatinine Equation (2021)   . Anion gap 08/06/2020 8  5 - 15 Final   Performed at [place] [address] [place], [place] [address]  . WBC 08/06/2020 5.0  4.0 - 10.5 K/uL Final  . RBC 08/06/2020 4.25  4.22 - 5.81 MIL/uL Final  . Hemoglobin 08/06/2020 14.8  13.0 - 17.0 g/dL Final  . HCT 08/06/2020 44.4  39.0 - 52.0 % Final  . MCV 08/06/2020 104.5 (A) 80.0 - 100.0 fL Final  . MCH 08/06/2020 34.8 (A) 26.0 - 34.0 pg Final  . MCHC 08/06/2020 33.3  30.0 - 36.0 g/dL Final  . RDW 08/06/2020 12.7  11.5 - 15.5 % Final  . Platelets 08/06/2020 222  150 - 400 K/uL Final  . nRBC  08/06/2020 0.0  0.0 - 0.2 % Final  . Neutrophils Relative % 08/06/2020 48  % Final  . Neutro Abs 08/06/2020 2.4  1.7 - 7.7 K/uL Final  . Lymphocytes Relative 08/06/2020 35  % Final  . Lymphs Abs 08/06/2020 1.8  0.7 - 4.0 K/uL Final  . Monocytes Relative 08/06/2020 10  % Final  . Monocytes Absolute 08/06/2020 0.5  0.1 - 1.0 K/uL Final  . Eosinophils Relative 08/06/2020 6  % Final  . Eosinophils Absolute 08/06/2020 0.3  0.0 - 0.5 K/uL Final  . Basophils Relative 08/06/2020 1  % Final  . Basophils Absolute 08/06/2020 0.0  0.0 - 0.1 K/uL Final  . Immature Granulocytes 08/06/2020 0  % Final  . Abs Immature Granulocytes 08/06/2020 0.02  0.00 - 0.07 K/uL Final   Performed at [facility], [place] [address], [place], [place] [address]  . Alcohol, Ethyl (B) 08/06/2020 142 (A) <10 mg/dL Final   Comment: (NOTE) Lowest detectable limit for serum alcohol is 10 mg/dL.  For medical purposes only. Performed at [facility], [place] [address], [place], [place] [address]   . Salicylate Lvl 57/84/6962 <7.0 (A) 7.0 - 30.0 mg/dL Final   Performed at [place] [address] [place], [place] [address]  . Acetaminophen (Tylenol), Serum 08/06/2020 <10 (A) 10 - 30 ug/mL Final   Comment: (NOTE) Therapeutic concentrations vary significantly. A range of 10-30 ug/mL  may be an effective concentration for many patients. However, some  are best treated at concentrations outside of this range. Acetaminophen concentrations >150 ug/mL at 4 hours after ingestion  and >50 ug/mL at 12 hours after ingestion are often associated with  toxic reactions.  Performed at [facility], [place] [address], [place], [place] [address]   . Color, Urine 08/06/2020 STRAW (A) YELLOW Final  . APPearance 08/06/2020 CLEAR  CLEAR Final  . Specific Gravity, Urine 08/06/2020 1.009  1.005 - 1.030 Final  . pH 08/06/2020 5.0  5.0 - 8.0 Final  . Glucose, UA 08/06/2020 NEGATIVE  NEGATIVE mg/dL Final  . Hgb urine  dipstick 08/06/2020 NEGATIVE  NEGATIVE Final  . Bilirubin Urine 08/06/2020 NEGATIVE  NEGATIVE Final  . Ketones, ur 08/06/2020 NEGATIVE  NEGATIVE mg/dL Final  . Protein, ur 08/06/2020 NEGATIVE  NEGATIVE mg/dL Final  . Nitrite 08/06/2020 NEGATIVE  NEGATIVE Final  . Chalmers Guest 08/06/2020 NEGATIVE  NEGATIVE Final   Performed at [facility], [place] [address] [place], [place] [address]  . Opiates 08/06/2020 NONE DETECTED  NONE DETECTED Final  . Cocaine 08/06/2020 NONE DETECTED  NONE DETECTED  Final  . Benzodiazepines 08/06/2020 NONE DETECTED  NONE DETECTED Final  . Amphetamines 08/06/2020 POSITIVE (A) NONE DETECTED Final  . Tetrahydrocannabinol 08/06/2020 NONE DETECTED  NONE DETECTED Final  . Barbiturates 08/06/2020 NONE DETECTED  NONE DETECTED Final   Comment: (NOTE) DRUG SCREEN FOR MEDICAL PURPOSES ONLY.  IF CONFIRMATION IS NEEDED FOR ANY PURPOSE, NOTIFY LAB WITHIN 5 DAYS.  LOWEST DETECTABLE LIMITS FOR URINE DRUG SCREEN Drug Class                     Cutoff (ng/mL) Amphetamine and metabolites    1000 Barbiturate and metabolites    200 Benzodiazepine                 160 Tricyclics and metabolites     300 Opiates and metabolites        300 Cocaine and metabolites        300 THC                            50 Performed at [facility], [place] [address] [place], [place] [address]   . Specimen Description 08/06/2020 URINE, RANDOM   Final  . Special Requests 08/06/2020 NONE   Final  . Culture 08/06/2020    Final                   Value:NO GROWTH Performed at [facility], [place] [address], [place], [place] [address]   . Report Status 08/06/2020 08/07/2020 FINAL   Final  . Hepatitis B Surface Ag 08/06/2020 NON REACTIVE  NON REACTIVE Final  . HCV Ab 08/06/2020 NON REACTIVE  NON REACTIVE Final   Comment: (NOTE) Nonreactive HCV antibody screen is consistent with no HCV infections,  unless recent infection is suspected or other evidence exists to indicate HCV  infection.    . Hep A IgM 08/06/2020 NON REACTIVE  NON REACTIVE Final  . Hep B C IgM 08/06/2020 NON REACTIVE  NON REACTIVE Final   Performed at [facility], [place] [address] [place], [place] [address]  . SARS Coronavirus 2 by RT PCR 08/06/2020 NEGATIVE  NEGATIVE Final   Comment: (NOTE) SARS-CoV-2 target nucleic acids are NOT DETECTED.  The SARS-CoV-2 RNA is generally detectable in upper respiratory specimens during the acute phase of infection. The lowest concentration of SARS-CoV-2 viral copies this assay can detect is 138 copies/mL. A negative result does not preclude SARS-Cov-2 infection and should not be used as the sole basis for treatment or other patient management decisions. A negative result may occur with  improper specimen collection/handling, submission of specimen other than nasopharyngeal swab, presence of viral mutation(s) within the areas targeted by this assay, and inadequate number of viral copies(<138 copies/mL). A negative result must be combined with clinical observations, patient history, and epidemiological information. The expected result is Negative.  Fact Sheet for Patients:  EntrepreneurPulse.com.au  Fact Sheet for Healthcare Providers:  IncredibleEmployment.be  This test is no                          t yet approved or cleared by the [place] FDA and  has been authorized for detection and/or diagnosis of SARS-CoV-2 by FDA under an Emergency Use Authorization (EUA). This EUA will remain  in effect (meaning this test can be used) for the duration of the COVID-19 declaration under Section 564(b)(1) of the Act, 21  U.S.C.section 360bbb-3(b)(1), unless the authorization is terminated  or revoked sooner.      . Influenza A by PCR 08/06/2020 NEGATIVE  NEGATIVE Final  . Influenza B by PCR 08/06/2020 NEGATIVE  NEGATIVE Final   Comment: (NOTE) The Xpert Xpress SARS-CoV-2/FLU/RSV plus assay is intended as an  aid in the diagnosis of influenza from Nasopharyngeal swab specimens and should not be used as a sole basis for treatment. Nasal washings and aspirates are unacceptable for Xpert Xpress SARS-CoV-2/FLU/RSV testing.  Fact Sheet for Patients: EntrepreneurPulse.com.au  Fact Sheet for Healthcare Providers: IncredibleEmployment.be  This test is not yet approved or cleared by the [place] FDA and has been authorized for detection and/or diagnosis of SARS-CoV-2 by FDA under an Emergency Use Authorization (EUA). This EUA will remain in effect (meaning this test can be used) for the duration of the COVID-19 declaration under Section 564(b)(1) of the Act, 21 U.S.C. section 360bbb-3(b)(1), unless the authorization is terminated or revoked.  Performed at [facility], [place] [address] [place], [place] [address]   . TSH 08/07/2020 2.254  0.350 - 4.500 uIU/mL Final   Comment: Performed by a 3rd Generation assay with a functional sensitivity of <=0.01 uIU/mL. Performed at [facility], [place] [address] [place], [place] [address]   Admission on 07/11/2020, Discharged on 07/11/2020  Component Date Value Ref Range Status  . Color, UA 07/11/2020 yellow  yellow Final  . Clarity, UA 07/11/2020 clear  clear Final  . Glucose, UA 07/11/2020 negative  negative mg/dL Final  . Bilirubin, UA 07/11/2020 negative  negative Final  . Ketones, POC UA 07/11/2020 negative  negative mg/dL Final  . Spec Grav, UA 07/11/2020 1.025  1.010 - 1.025 Final  . Blood, UA 07/11/2020 trace-intact (A) negative Final  . pH, UA 07/11/2020 6.0  5.0 - 8.0 Final  . Protein Ur, POC 07/11/2020 negative  negative mg/dL Final  . Urobilinogen, UA 07/11/2020 0.2  0.2 or 1.0 E.U./dL Final  . Nitrite, UA 07/11/2020 Negative  Negative Final  . Leukocytes, UA 07/11/2020 Negative  Negative Final  . Specimen Description 07/11/2020 URINE, CLEAN CATCH   Final  . Special Requests 07/11/2020  NONE   Final  . Culture 07/11/2020  (A)  Final                   Value:<10,000 COLONIES/mL INSIGNIFICANT GROWTH Performed at [facility], [address] [place], [place] [address]   . Report Status 07/11/2020 07/13/2020 FINAL   Final  Admission on 05/01/2020, Discharged on 05/01/2020  Component Date Value Ref Range Status  . SARS Coronavirus 2 05/01/2020 POSITIVE (A) NEGATIVE Final   Comment: (NOTE) SARS-CoV-2 target nucleic acids are DETECTED.  The SARS-CoV-2 RNA is generally detectable in upper and lower respiratory specimens during the acute phase of infection. Positive results are indicative of the presence of SARS-CoV-2 RNA. Clinical correlation with patient history and other diagnostic information is  necessary to determine patient infection status. Positive results do not rule out bacterial infection or co-infection with other viruses.  The expected result is Negative.  Fact Sheet for Patients: SugarRoll.be  Fact Sheet for Healthcare Providers: https://www.woods-mathews.com/  This test is not yet approved or cleared by the [place] FDA and  has been authorized for detection and/or diagnosis of SARS-CoV-2 by FDA under an Emergency Use Authorization (EUA). This EUA will remain  in effect (meaning this test can be used) for the duration of the COVID-19 declaration under Section 564(b)(1) of  the Act, 21 U.                          S.C. section 360bbb-3(b)(1), unless the authorization is terminated or revoked sooner.   Performed at [facility], [place] [address] [place], [place] [address]   Admission on 04/10/2020, Discharged on 04/11/2020  Component Date Value Ref Range Status  . Sodium 04/10/2020 142  135 - 145 mmol/L Final  . Potassium 04/10/2020 3.9  3.5 - 5.1 mmol/L Final  . Chloride 04/10/2020 106  98 - 111 mmol/L Final  . CO2 04/10/2020 25  22 - 32 mmol/L Final  . Glucose, Bld 04/10/2020 111 (A) 70 - 99  mg/dL Final   Glucose reference range applies only to samples taken after fasting for at least 8 hours.  . BUN 04/10/2020 5 (A) 6 - 20 mg/dL Final  . Creatinine, Ser 04/10/2020 0.79  0.61 - 1.24 mg/dL Final  . Calcium 04/10/2020 9.1  8.9 - 10.3 mg/dL Final  . GFR, Estimated 04/10/2020 >60  >60 mL/min Final   Comment: (NOTE) Calculated using the CKD-EPI Creatinine Equation (2021)   . Anion gap 04/10/2020 11  5 - 15 Final   Performed at [place] [address] [place], [place] [address]  . WBC 04/10/2020 7.0  4.0 - 10.5 K/uL Final  . RBC 04/10/2020 4.51  4.22 - 5.81 MIL/uL Final  . Hemoglobin 04/10/2020 15.9  13.0 - 17.0 g/dL Final  . HCT 04/10/2020 45.2  39.0 - 52.0 % Final  . MCV 04/10/2020 100.2 (A) 80.0 - 100.0 fL Final  . MCH 04/10/2020 35.3 (A) 26.0 - 34.0 pg Final  . MCHC 04/10/2020 35.2  30.0 - 36.0 g/dL Final  . RDW 04/10/2020 12.9  11.5 - 15.5 % Final  . Platelets 04/10/2020 315  150 - 400 K/uL Final  . nRBC 04/10/2020 0.0  0.0 - 0.2 % Final   Performed at [facility], [place] [address] [place], [place] [address]  . Troponin I (High Sensitivity) 04/10/2020 2  <18 ng/L Final   Comment: (NOTE) Elevated high sensitivity troponin I (hsTnI) values and significant  changes across serial measurements may suggest ACS but many other  chronic and acute conditions are known to elevate hsTnI results.  Refer to the "Links" section for chest pain algorithms and additional  guidance. Performed at [facility], [place] [address], [place], [place] [address]   . Troponin I (High Sensitivity) 04/11/2020 3  <18 ng/L Final   Comment: (NOTE) Elevated high sensitivity troponin I (hsTnI) values and significant  changes across serial measurements may suggest ACS but many other  chronic and acute conditions are known to elevate hsTnI results.  Refer to the "Links" section for chest pain algorithms and additional  guidance. Performed at [facility], [place] [address] [place], [place] [address]     Allergies: Wellbutrin [bupropion]  PTA Medications: (Not in a hospital admission)   Medical Decision Making  Covid and labs ordered Home medications restarted Ativan PRN Detox Protocol ordered    Recommendations  Based on my evaluation the patient does not appear to have an emergency medical condition.  [place], FNP 09/29/20  2:45 PM

## 2020-10-02 NOTE — Telephone Encounter (Signed)
-----   Message from Roney Jaffe, [place] sent at 09/24/2020  9:11 AM EDT ----- Please call patient and let him know that his thyroid function, kidney function and liver function are within normal limits.  His vitamin D is within normal limits as well.  He does not show signs of anemia.  His cholesterol is very elevated.  Please confirm that he was fasting for this lab.

## 2020-10-07 ENCOUNTER — Encounter (HOSPITAL_COMMUNITY): Payer: Self-pay

## 2020-10-07 ENCOUNTER — Emergency Department (HOSPITAL_COMMUNITY)
Admission: EM | Admit: 2020-10-07 | Discharge: 2020-10-07 | Disposition: A | Discharge status: Home / Self Care | Payer: Self-pay | Attending: Emergency Medicine | Admitting: Emergency Medicine

## 2020-10-07 ENCOUNTER — Emergency Department (HOSPITAL_COMMUNITY): Payer: Self-pay

## 2020-10-07 DIAGNOSIS — S0083XA Contusion of other part of head, initial encounter: Secondary | ICD-10-CM | POA: Insufficient documentation

## 2020-10-07 DIAGNOSIS — Z79899 Other long term (current) drug therapy: Secondary | ICD-10-CM | POA: Insufficient documentation

## 2020-10-07 DIAGNOSIS — W08XXXA Fall from other furniture, initial encounter: Secondary | ICD-10-CM | POA: Insufficient documentation

## 2020-10-07 DIAGNOSIS — E039 Hypothyroidism, unspecified: Secondary | ICD-10-CM | POA: Insufficient documentation

## 2020-10-07 DIAGNOSIS — Y908 Blood alcohol level of 240 mg/100 ml or more: Secondary | ICD-10-CM | POA: Insufficient documentation

## 2020-10-07 DIAGNOSIS — F10920 Alcohol use, unspecified with intoxication, uncomplicated: Secondary | ICD-10-CM

## 2020-10-07 DIAGNOSIS — F10929 Alcohol use, unspecified with intoxication, unspecified: Secondary | ICD-10-CM | POA: Insufficient documentation

## 2020-10-07 LAB — CBC WITH DIFFERENTIAL/PLATELET
Abs Immature Granulocytes: 0.02 10*3/uL (ref 0.00–0.07)
Basophils Absolute: 0 10*3/uL (ref 0.0–0.1)
Basophils Relative: 1 %
Eosinophils Absolute: 0 10*3/uL (ref 0.0–0.5)
Eosinophils Relative: 1 %
HCT: 42.8 % (ref 39.0–52.0)
Hemoglobin: 14.1 g/dL (ref 13.0–17.0)
Immature Granulocytes: 0 %
Lymphocytes Relative: 33 %
Lymphs Abs: 2 10*3/uL (ref 0.7–4.0)
MCH: 35.1 pg — ABNORMAL HIGH (ref 26.0–34.0)
MCHC: 32.9 g/dL (ref 30.0–36.0)
MCV: 106.5 fL — ABNORMAL HIGH (ref 80.0–100.0)
Monocytes Absolute: 0.4 10*3/uL (ref 0.1–1.0)
Monocytes Relative: 6 %
Neutro Abs: 3.7 10*3/uL (ref 1.7–7.7)
Neutrophils Relative %: 59 %
Platelets: 256 10*3/uL (ref 150–400)
RBC: 4.02 MIL/uL — ABNORMAL LOW (ref 4.22–5.81)
RDW: 12.7 % (ref 11.5–15.5)
WBC: 6.1 10*3/uL (ref 4.0–10.5)
nRBC: 0 % (ref 0.0–0.2)

## 2020-10-07 LAB — RAPID URINE DRUG SCREEN, HOSP PERFORMED
Amphetamines: NOT DETECTED
Barbiturates: NOT DETECTED
Benzodiazepines: POSITIVE — AB
Cocaine: NOT DETECTED
Opiates: NOT DETECTED
Tetrahydrocannabinol: NOT DETECTED

## 2020-10-07 LAB — COMPREHENSIVE METABOLIC PANEL
ALT: 36 U/L (ref 0–44)
AST: 21 U/L (ref 15–41)
Albumin: 4.3 g/dL (ref 3.5–5.0)
Alkaline Phosphatase: 89 U/L (ref 38–126)
Anion gap: 8 (ref 5–15)
BUN: 10 mg/dL (ref 6–20)
CO2: 28 mmol/L (ref 22–32)
Calcium: 8.9 mg/dL (ref 8.9–10.3)
Chloride: 102 mmol/L (ref 98–111)
Creatinine, Ser: 0.75 mg/dL (ref 0.61–1.24)
GFR, Estimated: >60 mL/min (ref >60)
Glucose, Bld: 102 mg/dL — ABNORMAL HIGH (ref 70–99)
Potassium: 3.5 mmol/L (ref 3.5–5.1)
Sodium: 138 mmol/L (ref 135–145)
Total Bilirubin: 0.5 mg/dL (ref 0.3–1.2)
Total Protein: 7.7 g/dL (ref 6.5–8.1)

## 2020-10-07 LAB — ETHANOL: Alcohol, Ethyl (B): 374 mg/dL (ref <10)

## 2020-10-07 MED ORDER — LORAZEPAM 1 MG PO TABS: 0.0000 mg | ORAL_TABLET | Freq: Two times a day (BID) | ORAL | Status: DC

## 2020-10-07 MED ORDER — LORAZEPAM 1 MG PO TABS: 0.0000 mg | ORAL_TABLET | Freq: Four times a day (QID) | ORAL | Status: DC

## 2020-10-07 MED ORDER — LORAZEPAM 2 MG/ML IJ SOLN: 0.0000 mg | Freq: Four times a day (QID) | INTRAMUSCULAR | Status: DC

## 2020-10-07 MED ORDER — THIAMINE HCL 100 MG/ML IJ SOLN
100.0000 mg | Freq: Every day | INTRAMUSCULAR | Status: DC
Start: 2020-10-07 — End: 2020-10-08

## 2020-10-07 MED ORDER — LORAZEPAM 2 MG/ML IJ SOLN
0.0000 mg | Freq: Two times a day (BID) | INTRAMUSCULAR | Status: DC
Start: 2020-10-10 — End: 2020-10-08

## 2020-10-07 MED ORDER — THIAMINE HCL 100 MG PO TABS: 100.0000 mg | ORAL_TABLET | Freq: Every day | ORAL | Status: DC

## 2020-10-07 NOTE — ED Notes (Signed)
Pt's ETOH level was 374, Hina notified.

## 2020-10-07 NOTE — ED Provider Notes (Signed)
[facility]-EMERGENCY DEPT Provider Note   CSN: [id number] Arrival date & time: 10/07/20  1515     History Chief Complaint  Patient presents with   Fall    Possible alcohol intoxication     Ian Munoz is a 40 y.o. male with a past medical history of methamphetamine abuse, alcohol abuse, depression presenting to the ED with a chief complaint of fall.  Patient states that he has been drinking alcohol today and drinks alcohol on a daily basis.  Admits to methamphetamine use as well.  States that he fell but does not remember the circumstances regarding his fall.  States that he has pain but will not tell me where. Level 5 caveat 2/2 possible intoxication.  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 Authorizing Provider  ARIPiprazole (ABILIFY) 5 MG tablet Take 1 tablet (5 mg total) by mouth daily. 09/29/20   Money, Gerlene Burdock, FNP  escitalopram (LEXAPRO) 10 MG tablet Take 2 tablets (20 mg total) by mouth daily. 09/29/20   Money, Gerlene Burdock, FNP  famotidine  (PEPCID) 20 MG tablet Take 1 tablet (20 mg total) by mouth 2 (two) times daily. 09/29/20   Money, Gerlene Burdock, FNP  levothyroxine (SYNTHROID) 25 MCG tablet Take 1 tablet (25 mcg total) by mouth daily before breakfast. 09/29/20   Money, Gerlene Burdock, FNP  traZODone (DESYREL) 100 MG tablet Take 1 tablet (100 mg total) by mouth at bedtime as needed for sleep. 09/29/20   Money, Gerlene Burdock, FNP    Allergies    Wellbutrin [bupropion]  Review of Systems   Review of Systems  Unable to perform ROS: Mental status change   Physical Exam Updated Vital Signs BP 117/88 (BP Location: Left Arm)   Pulse 88   Temp 98 F (36.7 C) (Oral)   Resp 16   SpO2 98%   Physical Exam Vitals and nursing note reviewed.  Constitutional:      General: He is not in acute distress.    Appearance: He is well-developed.  HENT:     Head: Normocephalic.     Comments: Hematoma noted to forehead.    Nose: Nose normal.  Eyes:     General: No scleral icterus.       Right eye: No discharge.        Left eye: No discharge.     Conjunctiva/sclera: Conjunctivae normal.     Pupils: Pupils are equal, round, and reactive to  light.  Cardiovascular:     Rate and Rhythm: Normal rate and regular rhythm.     Heart sounds: Normal heart sounds. No murmur heard.   No friction rub. No gallop.  Pulmonary:     Effort: Pulmonary effort is normal. No respiratory distress.     Breath sounds: Normal breath sounds.  Abdominal:     General: Bowel sounds are normal. There is no distension.     Palpations: Abdomen is soft.     Tenderness: There is no abdominal tenderness. There is no guarding.  Musculoskeletal:        General: Normal range of motion.     Cervical back: Normal range of motion and neck supple.  Skin:    General: Skin is warm and dry.     Findings: No rash.  Neurological:     Mental Status: He is alert.     Cranial Nerves: No cranial nerve deficit.     Motor: No abnormal muscle tone.     Coordination: Coordination normal.      Comments: Alert.  No facial asymmetry noted.  Strength 5/5 in bilateral upper and lower extremities.    ED Results / Procedures / Treatments   Labs (all labs ordered are listed, but only abnormal results are displayed) Labs Reviewed  COMPREHENSIVE METABOLIC PANEL - Abnormal; Notable for the following components:      Result Value   Glucose, Bld 102 (*)    All other components within normal limits  ETHANOL - Abnormal; Notable for the following components:   Alcohol, Ethyl (B) 374 (*)    All other components within normal limits  CBC WITH DIFFERENTIAL/PLATELET - Abnormal; Notable for the following components:   RBC 4.02 (*)    MCV 106.5 (*)    MCH 35.1 (*)    All other components within normal limits  RAPID URINE DRUG SCREEN, HOSP PERFORMED - Abnormal; Notable for the following components:   Benzodiazepines POSITIVE (*)    All other components within normal limits    EKG None  Radiology CT Head Wo Contrast  Result Date: 10/07/2020 CLINICAL DATA:  40 year old male with altered mental status. EXAM: CT HEAD WITHOUT CONTRAST TECHNIQUE: Contiguous axial images were obtained from the base of the skull through the vertex without intravenous contrast. COMPARISON:  05/01/2020 CT FINDINGS: Brain: No evidence of acute infarction, hemorrhage, hydrocephalus, extra-axial collection or mass lesion/mass effect. Vascular: No hyperdense vessel or unexpected calcification. Skull: Normal. Negative for fracture or focal lesion. Sinuses/Orbits: No acute finding. Other: None. IMPRESSION: Unremarkable noncontrast head CT. Electronically Signed   By: Harmon Pier M.D.   On: 10/07/2020 16:56    Procedures Procedures   Medications Ordered in ED Medications  LORazepam (ATIVAN) injection 0-4 mg (0 mg Intravenous Not Given 10/07/20 1814)    Or  LORazepam (ATIVAN) tablet 0-4 mg ( Oral See Alternative 10/07/20 1814)  LORazepam (ATIVAN) injection 0-4 mg (has no administration in time range)    Or  LORazepam  (ATIVAN) tablet 0-4 mg (has no administration in time range)  thiamine tablet 100 mg (100 mg Oral Not Given 10/07/20 1814)    Or  thiamine (B-1) injection 100 mg ( Intravenous See Alternative 10/07/20 1814)    ED Course  I have reviewed the triage vital signs and the nursing notes.  Pertinent labs & imaging results that were available during my care of the patient were reviewed by me and considered in my medical decision making (see chart for details).  Clinical Course as  of 10/07/20 2007  Mon Oct 07, 2020  1717 CT Head Wo Contrast No acute findings. [HK]  1824 Sodium: 138 [HK]  1824 Creatinine: 0.75 [HK]  1824 Hemoglobin: 14.1 [HK]  1824 Benzodiazepines(!): POSITIVE [HK]  1824 Patient sleeping comfortably on reexamination. [HK]  1848 Alcohol, Ethyl (B)(!!): 374 [HK]    Clinical Course User Index [HK] Dietrich Pates, PA-C   MDM Rules/Calculators/A&P                          40 year old male with past medical history of methamphetamine abuse, alcohol abuse, depression presenting to the ED with chief complaint of fall.  Hematoma noted to forehead.  He is moving all extremities without difficulty.  He does admit to alcohol and methamphetamine use today.  Reports history of alcohol abuse.  He is alert, vital signs within normal limits.  He denies any shortness of breath.  CT of the head without any abnormalities.  UDS positive for benzodiazepines.  CBC unremarkable.  Ethanol level elevated at 374.  CMP unremarkable.  Was observed here for 5 hours with improvement in his mental status.  He is alert, stating that he would like to go home.  I suspect that his symptoms are due to his acute alcohol intoxication without complication.  No intracranial injury or traumatic cause noted.  He denies SI or HI.  Ambulatory here.  Return precautions given.   Patient is hemodynamically stable, in NAD, and able to ambulate in the ED. Evaluation does not show pathology that would require ongoing emergent  intervention or inpatient treatment. I explained the diagnosis to the patient. Pain has been managed and has no complaints prior to discharge. Patient is comfortable with above plan and is stable for discharge at this time. All questions were answered prior to disposition. Strict return precautions for returning to the ED were discussed. Encouraged follow up with PCP.   An After Visit Summary was printed and given to the patient.   Portions of this note were generated with Scientist, clinical (histocompatibility and immunogenetics). Dictation errors may occur despite best attempts at proofreading.  Final Clinical Impression(s) / ED Diagnoses Final diagnoses:  Contusion of other part of head, initial encounter  Alcoholic intoxication without complication [facility])    Rx / DC Orders ED Discharge Orders     None        Dietrich Pates, PA-C 10/07/20 2008    Rolan Bucco, MD 10/08/20 1520

## 2020-10-07 NOTE — ED Triage Notes (Addendum)
Patient arrived via [facility] from [facility] on [address].   Staff states the patient was not intoxicated when he showed up and do not know if he had anything to drink.   Staff found him in the corner slumped over. Patient fell off his stool and hit his head the floor.   Contusion to middle forehead.   No LOC no blood thinners.  C/o right knee pain  A/Ox4   Hx: alcohol and depression

## 2020-10-07 NOTE — Discharge Instructions (Addendum)
Continue your home medications as previously prescribed. Follow-up with your primary care provider. Return to the ER if you start to experience thoughts of wanting to harm yourself or others, chest pain, shortness of breath, injuries or falls

## 2020-10-07 NOTE — ED Notes (Signed)
Pt refused EKG.

## 2020-10-08 ENCOUNTER — Emergency Department (HOSPITAL_COMMUNITY)
Admission: EM | Admit: 2020-10-08 | Discharge: 2020-10-09 | Disposition: A | Discharge status: Home / Self Care | Payer: Self-pay | Attending: Emergency Medicine | Admitting: Emergency Medicine

## 2020-10-08 ENCOUNTER — Encounter (HOSPITAL_COMMUNITY): Payer: Self-pay | Admitting: Emergency Medicine

## 2020-10-08 DIAGNOSIS — Z79899 Other long term (current) drug therapy: Secondary | ICD-10-CM | POA: Insufficient documentation

## 2020-10-08 DIAGNOSIS — F1094 Alcohol use, unspecified with alcohol-induced mood disorder: Secondary | ICD-10-CM | POA: Insufficient documentation

## 2020-10-08 DIAGNOSIS — R45851 Suicidal ideations: Secondary | ICD-10-CM | POA: Insufficient documentation

## 2020-10-08 DIAGNOSIS — E039 Hypothyroidism, unspecified: Secondary | ICD-10-CM | POA: Insufficient documentation

## 2020-10-08 DIAGNOSIS — F1010 Alcohol abuse, uncomplicated: Secondary | ICD-10-CM

## 2020-10-08 LAB — COMPREHENSIVE METABOLIC PANEL
ALT: 32 U/L (ref 0–44)
AST: 20 U/L (ref 15–41)
Albumin: 4.1 g/dL (ref 3.5–5.0)
Alkaline Phosphatase: 79 U/L (ref 38–126)
Anion gap: 11 (ref 5–15)
BUN: 9 mg/dL (ref 6–20)
CO2: 28 mmol/L (ref 22–32)
Calcium: 9.5 mg/dL (ref 8.9–10.3)
Chloride: 98 mmol/L (ref 98–111)
Creatinine, Ser: 0.86 mg/dL (ref 0.61–1.24)
GFR, Estimated: >60 mL/min (ref >60)
Glucose, Bld: 86 mg/dL (ref 70–99)
Potassium: 3.7 mmol/L (ref 3.5–5.1)
Sodium: 137 mmol/L (ref 135–145)
Total Bilirubin: 0.7 mg/dL (ref 0.3–1.2)
Total Protein: 7 g/dL (ref 6.5–8.1)

## 2020-10-08 LAB — RAPID URINE DRUG SCREEN, HOSP PERFORMED
Amphetamines: NOT DETECTED
Barbiturates: NOT DETECTED
Benzodiazepines: POSITIVE — AB
Cocaine: NOT DETECTED
Opiates: NOT DETECTED
Tetrahydrocannabinol: NOT DETECTED

## 2020-10-08 LAB — ETHANOL: Alcohol, Ethyl (B): <10 mg/dL (ref <10)

## 2020-10-08 LAB — CBC
HCT: 42.4 % (ref 39.0–52.0)
Hemoglobin: 14.1 g/dL (ref 13.0–17.0)
MCH: 35.4 pg — ABNORMAL HIGH (ref 26.0–34.0)
MCHC: 33.3 g/dL (ref 30.0–36.0)
MCV: 106.5 fL — ABNORMAL HIGH (ref 80.0–100.0)
Platelets: 280 10*3/uL (ref 150–400)
RBC: 3.98 MIL/uL — ABNORMAL LOW (ref 4.22–5.81)
RDW: 13.2 % (ref 11.5–15.5)
WBC: 8 10*3/uL (ref 4.0–10.5)
nRBC: 0 % (ref 0.0–0.2)

## 2020-10-08 LAB — ACETAMINOPHEN LEVEL: Acetaminophen (Tylenol), Serum: <10 ug/mL — ABNORMAL LOW (ref 10–30)

## 2020-10-08 LAB — SALICYLATE LEVEL: Salicylate Lvl: <7 mg/dL — ABNORMAL LOW (ref 7.0–30.0)

## 2020-10-08 NOTE — ED Notes (Signed)
Pt claiming SI while PA assessed him on triage, staffing called for sitter case, no sitter available at this time. Pt to stay on triage until he get a room.

## 2020-10-08 NOTE — ED Triage Notes (Signed)
Pt arrive POV c/o ETOH intoxication, requesting safe transport to go home.

## 2020-10-08 NOTE — ED Notes (Signed)
Pt now claiming SI during triage.

## 2020-10-08 NOTE — ED Provider Notes (Signed)
Emergency Medicine Provider Triage Evaluation Note  Ian Munoz , a 40 y.o. male  was evaluated in triage.  Pt complains of alcohol intoxication requesting safe transport to [facility].  Also endorses passive SI without plan.  States he had 5 drinking Cokes today and is not currently having any concerns aside from intoxication and desire for transfer.  States he could progress here but constricted [facility].  Review of Systems  Positive: Alcohol intoxication, intermittent lightheadedness, passive SI Negative: Suicidal plan, homicidal intent.,  AVH  Physical Exam  BP 109/79   Pulse 83   Temp 98.2 F (36.8 C) (Oral)   Resp 15   SpO2 100%  Gen:   Awake, no distress   Resp:  Normal effort  MSK:   Moves extremities without difficulty  Other:  RRR no M/R/G.  Lungs CTA BL.  Patient currently oriented, no focal deficit on neuro exam.  Medical Decision Making  Medically screening exam initiated at 8:40 PM.  Appropriate orders placed.  AADITYA LETIZIA was informed that the remainder of the evaluation will be completed by another provider, this initial triage assessment does not replace that evaluation, and the importance of remaining in the ED until their evaluation is complete.  This chart was dictated using voice recognition software, Dragon. Despite the best efforts of this provider to proofread and correct errors, errors may still occur which can change documentation meaning.  Discussed patient with PA at the [facility], states they are at capacity and unable to serve this patient.  Will evaluate him via TTS.   Sherrilee Gilles 10/08/20 2109    Pollyann Savoy, MD 10/08/20 [phone]

## 2020-10-08 NOTE — BH Assessment (Signed)
Clinician attempted to make contact w/ pt's Treatment Team at 2209 but pt is currently in the lobby and does not have any providers/nurses assigned to him in which clinician can make contact. TTS will attempt pt's assessment again at a later time.

## 2020-10-09 ENCOUNTER — Ambulatory Visit (HOSPITAL_COMMUNITY)
Admission: RE | Admit: 2020-10-09 | Discharge: 2020-10-09 | Disposition: A | Discharge status: Home / Self Care | Payer: No Payment, Other | Attending: Psychiatry | Admitting: Psychiatry

## 2020-10-09 DIAGNOSIS — F1094 Alcohol use, unspecified with alcohol-induced mood disorder: Secondary | ICD-10-CM | POA: Insufficient documentation

## 2020-10-09 MED ORDER — LORAZEPAM 1 MG PO TABS: 0.0000 mg | ORAL_TABLET | Freq: Two times a day (BID) | ORAL | Status: DC

## 2020-10-09 MED ORDER — LORAZEPAM 1 MG PO TABS: 0.0000 mg | ORAL_TABLET | Freq: Four times a day (QID) | ORAL | Status: DC

## 2020-10-09 MED ORDER — LORAZEPAM 2 MG/ML IJ SOLN: 0.0000 mg | Freq: Four times a day (QID) | INTRAMUSCULAR | Status: DC

## 2020-10-09 MED ORDER — LORAZEPAM 2 MG/ML IJ SOLN: 0.0000 mg | Freq: Two times a day (BID) | INTRAMUSCULAR | Status: DC

## 2020-10-09 MED ORDER — LEVOTHYROXINE SODIUM 25 MCG PO TABS: 25.0000 ug | ORAL_TABLET | Freq: Every day | ORAL | Status: DC

## 2020-10-09 MED ORDER — FAMOTIDINE 20 MG PO TABS: 20.0000 mg | ORAL_TABLET | Freq: Two times a day (BID) | ORAL | Status: DC

## 2020-10-09 MED ORDER — ARIPIPRAZOLE 5 MG PO TABS: 5.0000 mg | ORAL_TABLET | Freq: Every day | ORAL | Status: DC

## 2020-10-09 MED ORDER — TRAZODONE HCL 50 MG PO TABS: 100.0000 mg | ORAL_TABLET | Freq: Every evening | ORAL | Status: DC | PRN

## 2020-10-09 MED ORDER — ESCITALOPRAM OXALATE 10 MG PO TABS: 20.0000 mg | ORAL_TABLET | Freq: Every day | ORAL | Status: DC

## 2020-10-09 MED ORDER — THIAMINE HCL 100 MG PO TABS: 100.0000 mg | ORAL_TABLET | Freq: Every day | ORAL | Status: DC

## 2020-10-09 MED ORDER — THIAMINE HCL 100 MG/ML IJ SOLN: 100.0000 mg | Freq: Every day | INTRAMUSCULAR | Status: DC

## 2020-10-09 NOTE — ED Provider Notes (Signed)
TTS has been performed.  They do not feel the patient requires hospitalization.  He just completed an inpatient alcohol program, has only been out of week.  He does not need a detox.  Suicidal ideation has been evaluated and he has not felt to be a threat to himself.  Patient to be discharged with resources.   Gilda Crease, MD 10/09/20 [phone]

## 2020-10-09 NOTE — ED Provider Notes (Signed)
[facility] EMERGENCY DEPARTMENT Provider Note   CSN: [id number] Arrival date & time: 10/08/20  1815     History Chief Complaint  Patient presents with   Alcohol Intoxication    DANNIE HATTABAUGH is a 40 y.o. male.  Patient presents to the emergency department for evaluation of alcohol abuse with suicidal ideation.  Patient reports that he drinks daily.  When he does not drink he gets shaky but he has never had any seizures.  Patient reports that his last drink was around 5 AM.  Patient also concerned about pression and thoughts of harming himself.      Past Medical History:  Diagnosis Date   Depression    Prostate enlargement     Patient Active Problem List   Diagnosis Date Noted   Suicidal ideation    Homicidal ideation    Alcohol abuse with intoxication (HCC)    Hypothyroidism 09/17/2020   Gastroesophageal reflux disease without esophagitis 09/17/2020   Alcohol use disorder, severe, dependence (HCC) 08/07/2020   Methamphetamine abuse (HCC) 08/07/2020   Substance induced mood disorder (HCC)    Severe recurrent major depression without psychotic features (HCC) 08/06/2019    Past Surgical History:  Procedure Laterality Date   EYE SURGERY     WRIST SURGERY Right        Family History  Family history unknown: Yes    Social History   Tobacco Use   Smoking status: Never   Smokeless tobacco: Current    Types: Chew  Substance Use Topics   Alcohol use: No   Drug use: No    Home Medications Prior to Admission medications   Medication Sig Start Date End Date Taking? Authorizing Provider  ARIPiprazole (ABILIFY) 5 MG tablet Take 1 tablet (5 mg total) by mouth daily. 09/29/20   Money, Gerlene Burdock, FNP  escitalopram (LEXAPRO) 10 MG tablet Take 2 tablets (20 mg total) by mouth daily. 09/29/20   Money, Gerlene Burdock, FNP  famotidine (PEPCID) 20 MG tablet Take 1 tablet (20 mg total) by mouth 2 (two) times daily. 09/29/20   Money, Gerlene Burdock, FNP  levothyroxine  (SYNTHROID) 25 MCG tablet Take 1 tablet (25 mcg total) by mouth daily before breakfast. 09/29/20   Money, Gerlene Burdock, FNP  traZODone (DESYREL) 100 MG tablet Take 1 tablet (100 mg total) by mouth at bedtime as needed for sleep. 09/29/20   Money, Gerlene Burdock, FNP    Allergies    Wellbutrin [bupropion]  Review of Systems   Review of Systems  Psychiatric/Behavioral:  Positive for suicidal ideas.   All other systems reviewed and are negative.  Physical Exam Updated Vital Signs BP 109/71 (BP Location: Left Arm)   Pulse 74   Temp 98.2 F (36.8 C) (Oral)   Resp 16   SpO2 97%   Physical Exam Vitals and nursing note reviewed.  Constitutional:      General: He is not in acute distress.    Appearance: Normal appearance. He is well-developed.  HENT:     Head: Normocephalic and atraumatic.     Right Ear: Hearing normal.     Left Ear: Hearing normal.     Nose: Nose normal.  Eyes:     Conjunctiva/sclera: Conjunctivae normal.     Pupils: Pupils are equal, round, and reactive to light.  Cardiovascular:     Rate and Rhythm: Regular rhythm.     Heart sounds: S1 normal and S2 normal. No murmur heard.   No friction rub. No gallop.  Pulmonary:     Effort: Pulmonary effort is normal. No respiratory distress.     Breath sounds: Normal breath sounds.  Chest:     Chest wall: No tenderness.  Abdominal:     General: Bowel sounds are normal.     Palpations: Abdomen is soft.     Tenderness: There is no abdominal tenderness. There is no guarding or rebound. Negative signs include Murphy's sign and McBurney's sign.     Hernia: No hernia is present.  Musculoskeletal:        General: Normal range of motion.     Cervical back: Normal range of motion and neck supple.  Skin:    General: Skin is warm and dry.     Findings: No rash.  Neurological:     Mental Status: He is alert and oriented to person, place, and time.     GCS: GCS eye subscore is 4. GCS verbal subscore is 5. GCS motor subscore is 6.      Cranial Nerves: No cranial nerve deficit.     Sensory: No sensory deficit.     Coordination: Coordination normal.  Psychiatric:        Speech: Speech normal.        Behavior: Behavior normal.        Thought Content: Thought content includes suicidal ideation.    ED Results / Procedures / Treatments   Labs (all labs ordered are listed, but only abnormal results are displayed) Labs Reviewed  SALICYLATE LEVEL - Abnormal; Notable for the following components:      Result Value   Salicylate Lvl <7.0 (*)    All other components within normal limits  ACETAMINOPHEN LEVEL - Abnormal; Notable for the following components:   Acetaminophen (Tylenol), Serum <10 (*)    All other components within normal limits  CBC - Abnormal; Notable for the following components:   RBC 3.98 (*)    MCV 106.5 (*)    MCH 35.4 (*)    All other components within normal limits  RAPID URINE DRUG SCREEN, HOSP PERFORMED - Abnormal; Notable for the following components:   Benzodiazepines POSITIVE (*)    All other components within normal limits  COMPREHENSIVE METABOLIC PANEL  ETHANOL    EKG None  Radiology CT Head Wo Contrast  Result Date: 10/07/2020 CLINICAL DATA:  40 year old male with altered mental status. EXAM: CT HEAD WITHOUT CONTRAST TECHNIQUE: Contiguous axial images were obtained from the base of the skull through the vertex without intravenous contrast. COMPARISON:  05/01/2020 CT FINDINGS: Brain: No evidence of acute infarction, hemorrhage, hydrocephalus, extra-axial collection or mass lesion/mass effect. Vascular: No hyperdense vessel or unexpected calcification. Skull: Normal. Negative for fracture or focal lesion. Sinuses/Orbits: No acute finding. Other: None. IMPRESSION: Unremarkable noncontrast head CT. Electronically Signed   By: Harmon Pier M.D.   On: 10/07/2020 16:56    Procedures Procedures   Medications Ordered in ED Medications - No data to display  ED Course  I have reviewed the triage  vital signs and the nursing notes.  Pertinent labs & imaging results that were available during my care of the patient were reviewed by me and considered in my medical decision making (see chart for details).    MDM Rules/Calculators/A&P                          Patient with history of alcoholism presents to the emergency department with concerns over needing detox.  Additionally he is depressed  and having suicidal thoughts.  Will require psychiatric evaluation.  Patient medically clear for psychiatric evaluation and treatment.  Final Clinical Impression(s) / ED Diagnoses Final diagnoses:  ETOH abuse  Suicidal ideation    Rx / DC Orders ED Discharge Orders     None        Jeannine Pennisi, Canary Brim, MD 10/09/20 0128
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
## 2020-10-09 NOTE — ED Notes (Signed)
All wires have been removed from the room.

## 2020-10-09 NOTE — ED Notes (Signed)
Breakfast order placed ?

## 2020-10-09 NOTE — BH Assessment (Signed)
Pt re- presented to [facility] stating he was told to come here from ED. He was transported by a [employer] officer. This Clinical research associate and Elta Guadeloupe NP assessed patient, he stated his goal was to detox and he wanted to go to [facility] in [place] but ''thought I needed to come here to get paper to go [facility]''. Record reviewed and confirmed pt was seen and given resources for detox. The patient then asked for Safe transport to [employer]. Writer unable to provide safe transport. Pt given three bus passes to [facility] bus system and patient  given part bus route print out schedule with the officer who stated he would take him to nearest pick up so that patient could get to [facility]. Pt given enough fare passes to connect ride from [place] to [employer].

## 2020-10-09 NOTE — ED Notes (Signed)
Pt belongings have been inventoried and placed in locker #2. Security called to wand the patient. Phone and chewing tobacco locked with security.

## 2020-10-10 NOTE — H&P (Signed)
Medical screening exam  Patient refused vitals, MSE and refused to sign the form as he stated he wanted transportation and had just been at [facility] ED

## 2020-10-12 NOTE — Progress Notes (Signed)
Patient ID: Ian Munoz, male   DOB: 1981/03/05, 40 y.o.   MRN: [id number]  Late Entry: Patient was seen on 10/09/2020. Patient refused vitals and medical screening exam. He refused to sign the form stating he was only here for transportation to [facility] in [place] for detox. He had just been seen at [facility] emergency room. He was given 3 [facility] bus passes with a print out of the [facility] bus routes. He was here with a Event organiser who stated he would provide patient a ride to the nearest [facility] bus station. Patient left with [employer] in no apparent distress.

## 2020-10-14 ENCOUNTER — Ambulatory Visit: Payer: Self-pay | Admitting: Physician Assistant

## 2020-10-14 ENCOUNTER — Other Ambulatory Visit: Payer: Self-pay

## 2020-10-14 VITALS — BP 138/79 | HR 81 | Temp 98.2°F | Resp 18 | Ht 62.0 in | Wt 146.0 lb

## 2020-10-14 DIAGNOSIS — K219 Gastro-esophageal reflux disease without esophagitis: Secondary | ICD-10-CM

## 2020-10-14 DIAGNOSIS — F332 Major depressive disorder, recurrent severe without psychotic features: Secondary | ICD-10-CM

## 2020-10-14 DIAGNOSIS — F1994 Other psychoactive substance use, unspecified with psychoactive substance-induced mood disorder: Secondary | ICD-10-CM

## 2020-10-14 DIAGNOSIS — E039 Hypothyroidism, unspecified: Secondary | ICD-10-CM

## 2020-10-14 DIAGNOSIS — F1020 Alcohol dependence, uncomplicated: Secondary | ICD-10-CM

## 2020-10-14 MED ORDER — FAMOTIDINE 20 MG PO TABS
20.0000 mg | ORAL_TABLET | Freq: Two times a day (BID) | ORAL | 0 refills | Status: DC
  Filled 2020-10-14: qty 60, 30d supply, fill #0

## 2020-10-14 MED ORDER — ARIPIPRAZOLE 5 MG PO TABS
5.0000 mg | ORAL_TABLET | Freq: Every day | ORAL | 1 refills | Status: DC
  Filled 2020-10-14: qty 30, 30d supply, fill #0

## 2020-10-14 MED ORDER — ESCITALOPRAM OXALATE 10 MG PO TABS
20.0000 mg | ORAL_TABLET | Freq: Every day | ORAL | 1 refills | Status: DC
  Filled 2020-10-14: qty 60, 30d supply, fill #0

## 2020-10-14 MED ORDER — LEVOTHYROXINE SODIUM 25 MCG PO TABS
25.0000 ug | ORAL_TABLET | Freq: Every day | ORAL | 1 refills | Status: DC
  Filled 2020-10-14: qty 30, 30d supply, fill #0

## 2020-10-14 MED ORDER — QUETIAPINE FUMARATE 50 MG PO TABS
50.0000 mg | ORAL_TABLET | Freq: Every day | ORAL | 1 refills | Status: DC
  Filled 2020-10-14: qty 30, 30d supply, fill #0

## 2020-10-14 NOTE — Patient Instructions (Signed)
You will stop trazodone and start Seroquel 50 mg at bedtime.  I encourage you to take this on a nightly basis, not as needed.  Please let us know if there is anything else we can do for you.  Roney Jaffe, PA-C Physician Assistant [facility] [email or web address]   Quetiapine tablets What is this medication? QUETIAPINE (kwe TYE a peen) is an antipsychotic. It is used to treatschizophrenia and bipolar disorder, also known as manic-depression. This medicine may be used for other purposes; ask your health care provider orpharmacist if you have questions. COMMON BRAND NAME(S): Seroquel What should I tell my care team before I take this medication? They need to know if you have any of these conditions: blockage in your bowel cataracts constipation dementia diabetes difficulty swallowing glaucoma heart disease high levels of prolactin history of breast cancer history of irregular heartbeat liver disease low blood counts, like low white cell, platelet, or red cell counts low blood pressure Parkinson's disease prostate disease seizures suicidal thoughts, plans or attempt; a previous suicide attempt by you or a family member thyroid disease trouble passing urine an unusual or allergic reaction to quetiapine, other medicines, foods, dyes, or preservatives pregnant or trying to get pregnant breast-feeding How should I use this medication? Take this medicine by mouth. Swallow it with a drink of water. Follow the directions on the prescription label. If it upsets your stomach you can take it with food. Take your medicine at regular intervals. Do not take it more often than directed. Do not stop taking except on the advice of your doctor or healthcare professional. A special MedGuide will be given to you by the pharmacist with eachprescription and refill. Be sure to read this information carefully each time. Talk to your  pediatrician regarding the use of this medicine in children. While this drug may be prescribed for children as young as 10 years for selectedconditions, precautions do apply. Patients over age 82 years may have a stronger reaction to this medicine andneed smaller doses. Overdosage: If you think you have taken too much of this medicine contact apoison control center or emergency room at once. NOTE: This medicine is only for you. Do not share this medicine with others. What if I miss a dose? If you miss a dose, take it as soon as you can. If it is almost time for yournext dose, take only that dose. Do not take double or extra doses. What may interact with this medication? Do not take this medicine with any of the following medications: cisapride dronedarone metoclopramide pimozide thioridazine This medicine may also interact with the following medications: alcohol antihistamines for allergy, cough, and cold atropine avasimibe certain antivirals for HIV or hepatitis certain medicines for anxiety or sleep certain medicines for bladder problems like oxybutynin, tolterodine certain medicines for depression like amitriptyline, fluoxetine, nefazodone, sertraline certain medicines for fungal infections like fluconazole, ketoconazole, itraconazole, posaconazole certain medicines for stomach problems like dicyclomine, hyoscyamine certain medicines for travel sickness like scopolamine cimetidine general anesthetics like halothane, isoflurane, methoxyflurane, propofol ipratropium levodopa or other medicines for Parkinson's disease medicines for blood pressure medicines for seizures medicines that relax muscles for surgery narcotic medicines for pain other medicines that prolong the QT interval (cause an abnormal heart rhythm) phenothiazines like chlorpromazine, prochlorperazine rifampin St. John's wort This list may not describe all possible interactions. Give your health care provider a list  of all the medicines, herbs, non-prescription drugs, or dietary supplements you use. Also tell them if you smoke,  drink alcohol, or use illegaldrugs. Some items may interact with your medicine. What should I watch for while using this medication? Visit your health care professional for regular checks on your progress. Tell your health care professional if symptoms do not start to get better or if they get worse. Do not stop taking except on your health care professional's advice. You may develop a severe reaction. Your health care professional will tell youhow much medicine to take. You may need to have an eye exam before and during use of this medicine. This medicine may increase blood sugar. Ask your health care provider ifchanges in diet or medicines are needed if you have diabetes. Patients and their families should watch out for new or worsening depression or thoughts of suicide. Also watch out for sudden or severe changes in feelings such as feeling anxious, agitated, panicky, irritable, hostile, aggressive, impulsive, severely restless, overly excited and hyperactive, or not being able to sleep. If this happens, especially at the beginning of antidepressanttreatment or after a change in dose, call your health care professional. Bonita Quin may get dizzy or drowsy. Do not drive, use machinery, or do anything that needs mental alertness until you know how this medicine affects you. Do not stand or sit up quickly, especially if you are an older patient. This reduces the risk of dizzy or fainting spells. Alcohol may interfere with the effect ofthis medicine. Avoid alcoholic drinks. This drug can cause problems with controlling your body temperature. It can lower the response of your body to cold temperatures. If possible, stay indoors during cold weather. If you must go outdoors, wear warm clothes. It can also lower the response of your body to heat. Do not overheat. Do not over-exercise. Stay out of the sun when  possible. If you must be in the sun, wear cool clothing. Drink plenty of water. If you have trouble controlling your bodytemperature, call your health care provider right away. What side effects may I notice from receiving this medication? Side effects that you should report to your doctor or health care professionalas soon as possible: allergic reactions like skin rash, itching or hives, swelling of the face, lips, or tongue breathing problems changes in vision confusion elevated mood, decreased need for sleep, racing thoughts, impulsive behavior eye pain fast, irregular heartbeat fever or chills, sore throat inability to keep still males: prolonged or painful erection problems with balance, talking, walking redness, blistering, peeling, or loosening of the skin, including inside the mouth seizures signs and symptoms of high blood sugar such as being more thirsty or hungry or having to urinate more than normal. You may also feel very tired or have blurry vision signs and symptoms of hypothyroidism like fatigue; increased sensitivity to cold; weight gain; hoarseness; thinning hair signs and symptoms of low blood pressure like dizziness; feeling faint or lightheaded; falls; unusually weak or tired signs and symptoms of neuroleptic malignant syndrome like confusion; fast, irregular heartbeat; high fever; increased sweating; stiff muscles sudden numbness or weakness of the face, arm, or leg suicidal thoughts or other mood changes trouble swallowing uncontrollable movements of the arms, face, head, mouth, neck, or upper body Side effects that usually do not require medical attention (report to yourdoctor or health care professional if they continue or are bothersome): change in sex drive or performance constipation drowsiness dry mouth upset stomach weight gain This list may not describe all possible side effects. Call your doctor for medical advice about side effects. You may report side  effects to  FDA at1-800-FDA-1088. Where should I keep my medication? Keep out of the reach of children. Store at room temperature between 15 and 30 degrees C (59 and 86 degrees F).Throw away any unused medicine after the expiration date. NOTE: This sheet is a summary. It may not cover all possible information. If you have questions about this medicine, talk to your doctor, pharmacist, orhealth care provider.  2022 Elsevier/Gold Standard (2019-02-22 14:42:48)

## 2020-10-14 NOTE — Progress Notes (Signed)
Patient has eaten today and patient has not taken medication today.Patient request a change in sleep medication due to it showing as "benzo's" in his drug test for sober living.

## 2020-10-14 NOTE — Progress Notes (Signed)
Established Patient Office Visit  Subjective:  Patient ID: Ian Munoz, male    DOB: [birth year]  Age: 40 y.o. MRN: [id number]  CC:  Chief Complaint  Patient presents with   Medication Management    HPI Ian Munoz reports that he is working on going to [facility] for residential rehab treatment.  Reports that he first has to go through detox in [place].  Reports that he went yesterday but was told to come back with his medication bottles.  Reports last time he filled his medications he put his medications into [facility] and threw his bottles away.  Reports that he was staying at a sober living house, states that he tested positive for benzodiazepines on his drug urine screen, states that he feels it is because he was taking trazodone.     Past Medical History:  Diagnosis Date   Depression    Prostate enlargement     Past Surgical History:  Procedure Laterality Date   EYE SURGERY     WRIST SURGERY Right     Family History  Family history unknown: Yes    Social History   Socioeconomic History   Marital status: Single    Spouse name: Not on file   Number of children: Not on file   Years of education: Not on file   Highest education level: Not on file  Occupational History   Not on file  Tobacco Use   Smoking status: Never   Smokeless tobacco: Current    Types: Chew  Substance and Sexual Activity   Alcohol use: No   Drug use: No   Sexual activity: Yes    Birth control/protection: None  Other Topics Concern   Not on file  Social History Narrative   Not on file   Social Determinants of Health   Financial Resource Strain: Not on file  Food Insecurity: Not on file  Transportation Needs: Not on file  Physical Activity: Not on file  Stress: Not on file  Social Connections: Not on file  Intimate Partner Violence: Not on file    Outpatient Medications Prior to Visit  Medication Sig Dispense Refill   ARIPiprazole (ABILIFY) 5 MG tablet Take 1 tablet (5  mg total) by mouth daily. 30 tablet 0   escitalopram (LEXAPRO) 10 MG tablet Take 2 tablets (20 mg total) by mouth daily. 60 tablet 0   famotidine (PEPCID) 20 MG tablet Take 1 tablet (20 mg total) by mouth 2 (two) times daily. 60 tablet 0   levothyroxine (SYNTHROID) 25 MCG tablet Take 1 tablet (25 mcg total) by mouth daily before breakfast. 30 tablet 0   traZODone (DESYREL) 100 MG tablet Take 1 tablet (100 mg total) by mouth at bedtime as needed for sleep. 30 tablet 0   No facility-administered medications prior to visit.    Allergies  Allergen Reactions   Wellbutrin [Bupropion]     Constipation    ROS Review of Systems  Constitutional: Negative.   HENT: Negative.    Eyes: Negative.   Respiratory:  Negative for shortness of breath.   Cardiovascular:  Negative for chest pain.  Gastrointestinal: Negative.   Endocrine: Negative.   Genitourinary: Negative.   Musculoskeletal: Negative.   Skin: Negative.   Allergic/Immunologic: Negative.   Neurological: Negative.   Hematological: Negative.   Psychiatric/Behavioral:  Positive for sleep disturbance. Negative for dysphoric mood, self-injury and suicidal ideas. The patient is not nervous/anxious.      Objective:    Physical Exam Vitals and  nursing note reviewed.  Constitutional:      Appearance: Normal appearance.  HENT:     Head: Normocephalic and atraumatic.     Right Ear: External ear normal.     Left Ear: External ear normal.     Nose: Nose normal.     Mouth/Throat:     Mouth: Mucous membranes are moist.     Pharynx: Oropharynx is clear.  Eyes:     Extraocular Movements: Extraocular movements intact.     Conjunctiva/sclera: Conjunctivae normal.     Pupils: Pupils are equal, round, and reactive to light.  Cardiovascular:     Rate and Rhythm: Normal rate and regular rhythm.     Pulses: Normal pulses.     Heart sounds: Normal heart sounds.  Pulmonary:     Effort: Pulmonary effort is normal.     Breath sounds: Normal  breath sounds.  Musculoskeletal:        General: Normal range of motion.     Cervical back: Normal range of motion and neck supple.  Skin:    General: Skin is warm and dry.  Neurological:     General: No focal deficit present.     Mental Status: He is alert and oriented to person, place, and time.  Psychiatric:        Mood and Affect: Mood normal.        Behavior: Behavior normal.        Thought Content: Thought content normal.        Judgment: Judgment normal.    BP 138/79 (BP Location: Left Arm, Patient Position: Sitting, Cuff Size: Normal)   Pulse 81   Temp 98.2 F (36.8 C) (Oral)   Resp 18   Ht 5' 2" (1.575 m)   Wt 146 lb (66.2 kg)   SpO2 99%   BMI 26.70 kg/m  Wt Readings from Last 3 Encounters:  10/14/20 146 lb (66.2 kg)  09/18/20 145 lb 8.1 oz (66 kg)  09/17/20 147 lb (66.7 kg)     Health Maintenance Due  Topic Date Due   Pneumococcal Vaccine 9-100 Years old (1 - PCV) Never done   HIV Screening  Never done    There are no preventive care reminders to display for this patient.  Lab Results  Component Value Date   TSH 1.680 09/23/2020   Lab Results  Component Value Date   WBC 8.0 10/08/2020   HGB 14.1 10/08/2020   HCT 42.4 10/08/2020   MCV 106.5 (H) 10/08/2020   PLT 280 10/08/2020   Lab Results  Component Value Date   NA 137 10/08/2020   K 3.7 10/08/2020   CO2 28 10/08/2020   GLUCOSE 86 10/08/2020   BUN 9 10/08/2020   CREATININE 0.86 10/08/2020   BILITOT 0.7 10/08/2020   ALKPHOS 79 10/08/2020   AST 20 10/08/2020   ALT 32 10/08/2020   PROT 7.0 10/08/2020   ALBUMIN 4.1 10/08/2020   CALCIUM 9.5 10/08/2020   ANIONGAP 11 10/08/2020   EGFR 116 09/23/2020   Lab Results  Component Value Date   CHOL 260 (H) 09/23/2020   Lab Results  Component Value Date   HDL 83 09/23/2020   Lab Results  Component Value Date   LDLCALC 137 (H) 09/23/2020   Lab Results  Component Value Date   TRIG 226 (H) 09/23/2020   Lab Results  Component Value Date    CHOLHDL 3.1 09/23/2020   No results found for: HGBA1C    Assessment & Plan:  Problem List Items Addressed This Visit       Digestive   Gastroesophageal reflux disease without esophagitis   Relevant Medications   famotidine (PEPCID) 20 MG tablet     Endocrine   Hypothyroidism   Relevant Medications   levothyroxine (SYNTHROID) 25 MCG tablet     Nervous and Auditory   Substance induced mood disorder (HCC)   Relevant Medications   ARIPiprazole (ABILIFY) 5 MG tablet   escitalopram (LEXAPRO) 10 MG tablet   QUEtiapine (SEROQUEL) 50 MG tablet     Other   Severe recurrent major depression without psychotic features (HCC) - Primary   Relevant Medications   escitalopram (LEXAPRO) 10 MG tablet   Alcohol use disorder, severe, dependence ([place])    Meds ordered this encounter  Medications   ARIPiprazole (ABILIFY) 5 MG tablet    Sig: Take 1 tablet (5 mg total) by mouth daily.    Dispense:  30 tablet    Refill:  1    Order Specific Question:   Supervising Provider    Answer:   Asencion Noble E [[id number]]   escitalopram (LEXAPRO) 10 MG tablet    Sig: Take 2 tablets (20 mg total) by mouth daily.    Dispense:  60 tablet    Refill:  1    Order Specific Question:   Supervising Provider    Answer:   Asencion Noble E [[id number]]   famotidine (PEPCID) 20 MG tablet    Sig: Take 1 tablet (20 mg total) by mouth 2 (two) times daily.    Dispense:  60 tablet    Refill:  0    Order Specific Question:   Supervising Provider    Answer:   Noralyn Pick   levothyroxine (SYNTHROID) 25 MCG tablet    Sig: Take 1 tablet (25 mcg total) by mouth daily before breakfast.    Dispense:  30 tablet    Refill:  1    Order Specific Question:   Supervising Provider    Answer:   Asencion Noble E [[id number]]   QUEtiapine (SEROQUEL) 50 MG tablet    Sig: Take 1 tablet (50 mg total) by mouth at bedtime.    Dispense:  30 tablet    Refill:  1    Stop trazodone    Order Specific Question:   Supervising  Provider    Answer:   Joya Gaskins, PATRICK E [[id number]]  1. Substance induced mood disorder [facility]) Patient given application for [place] financial assistance, [facility] to help patient with payment.  Trial Seroquel, stop trazodone.  Patient encouraged to return to mobile unit upon completion of rehab program. - ARIPiprazole (ABILIFY) 5 MG tablet; Take 1 tablet (5 mg total) by mouth daily.  Dispense: 30 tablet; Refill: 1 - escitalopram (LEXAPRO) 10 MG tablet; Take 2 tablets (20 mg total) by mouth daily.  Dispense: 60 tablet; Refill: 1 - QUEtiapine (SEROQUEL) 50 MG tablet; Take 1 tablet (50 mg total) by mouth at bedtime.  Dispense: 30 tablet; Refill: 1  2. Severe recurrent major depression without psychotic features [facility]) Patient will present to [facility] today  3. Alcohol use disorder, severe, dependence ([place]) Patient will present to [facility] today  4. Hypothyroidism, unspecified type Continue current regimen - levothyroxine (SYNTHROID) 25 MCG tablet; Take 1 tablet (25 mcg total) by mouth daily before breakfast.  Dispense: 30 tablet; Refill: 1  5. Gastroesophageal reflux disease without esophagitis Continue current regimen - famotidine (PEPCID) 20 MG tablet; Take 1  tablet (20 mg total) by mouth 2 (two) times daily.  Dispense: 60 tablet; Refill: 0  Patient visited the mobile unit at the end of May. 2022 and returned 1 week later to have fasting labs completed.  He states today that he was not fasting during those labs.  Encourage patient to have fasting labs completed as soon as he is able.   I have reviewed the patient's medical history (PMH, PSH, Social History, Family History, Medications, and allergies) , and have been updated if relevant. I spent 31 minutes reviewing chart and  face to face time with patient.    Follow-up: Return if symptoms worsen or fail to improve.    Loraine Grip Mayers, PA-C

## 2020-11-04 ENCOUNTER — Other Ambulatory Visit: Payer: Self-pay

## 2020-11-18 ENCOUNTER — Ambulatory Visit: Payer: 59 | Admitting: Critical Care Medicine

## 2020-12-19 ENCOUNTER — Ambulatory Visit: Payer: Self-pay | Admitting: Critical Care Medicine

## 2020-12-19 NOTE — Progress Notes (Deleted)
New Patient Office Visit  Subjective:  Patient ID: Ian Munoz, male    DOB: [birth year]  Age: 40 y.o. MRN: [id number]  CC: No chief complaint on file.   HPI RUTGER SALTON presents for *** Seen on [facility] at [facility] 10/14/20 by [facility] PA Mellody Life reports that he is working on going to [facility] for residential rehab treatment.  Reports that he first has to go through detox in [place].  Reports that he went yesterday but was told to come back with his medication bottles.  Reports last time he filled his medications he put his medications into [facility] and threw his bottles away.  Reports that he was staying at a sober living house, states that he tested positive for benzodiazepines on his drug urine screen, states that he feels it is because he was taking trazodone.  1. Substance induced mood disorder [facility]) Patient given application for [place] financial assistance, medication sent to [facility] to help patient with payment.  Trial Seroquel, stop trazodone.  Patient encouraged to return to mobile unit upon completion of rehab program. - ARIPiprazole (ABILIFY) 5 MG tablet; Take 1 tablet (5 mg total) by mouth daily.  Dispense: 30 tablet; Refill: 1 - escitalopram (LEXAPRO) 10 MG tablet; Take 2 tablets (20 mg total) by mouth daily.  Dispense: 60 tablet; Refill: 1 - QUEtiapine (SEROQUEL) 50 MG tablet; Take 1 tablet (50 mg total) by mouth at bedtime.  Dispense: 30 tablet; Refill: 1   2. Severe recurrent major depression without psychotic features [facility]) Patient will present to [facility] today   3. Alcohol use disorder, severe, dependence (HCC) Patient will present to [facility] today   4. Hypothyroidism, unspecified type Continue current regimen - levothyroxine (SYNTHROID) 25 MCG tablet; Take 1 tablet (25 mcg total) by mouth daily before breakfast.  Dispense: 30 tablet; Refill: 1   5. Gastroesophageal reflux disease without esophagitis Continue current  regimen - famotidine (PEPCID) 20 MG tablet; Take 1 tablet (20 mg total) by mouth 2 (two) times daily.  Dispense: 60 tablet; Refill: 0   Patient visited the mobile unit at the end of May. 2022 and returned 1 week later to have fasting labs completed.  He states today that he was not fasting during those labs.  Encourage patient to have fasting labs completed as soon as he is able.   Past Medical History:  Diagnosis Date   Depression    Prostate enlargement     Past Surgical History:  Procedure Laterality Date   EYE SURGERY     WRIST SURGERY Right     Family History  Family history unknown: Yes    Social History   Socioeconomic History   Marital status: Single    Spouse name: Not on file   Number of children: Not on file   Years of education: Not on file   Highest education level: Not on file  Occupational History   Not on file  Tobacco Use   Smoking status: Never   Smokeless tobacco: Current    Types: Chew  Substance and Sexual Activity   Alcohol use: No   Drug use: No   Sexual activity: Yes    Birth control/protection: None  Other Topics Concern   Not on file  Social History Narrative   Not on file   Social Determinants of Health   Financial Resource Strain: Not on file  Food Insecurity: Not on file  Transportation Needs: Not on file  Physical Activity:  Not on file  Stress: Not on file  Social Connections: Not on file  Intimate Partner Violence: Not on file    ROS Review of Systems  Objective:   Today's Vitals: There were no vitals taken for this visit.  Physical Exam  Assessment & Plan:   Problem List Items Addressed This Visit   None   Outpatient Encounter Medications as of 12/19/2020  Medication Sig   ARIPiprazole (ABILIFY) 5 MG tablet Take 1 tablet (5 mg total) by mouth daily.   escitalopram (LEXAPRO) 10 MG tablet Take 2 tablets (20 mg total) by mouth daily.   famotidine (PEPCID) 20 MG tablet Take 1 tablet (20 mg total) by mouth 2 (two)  times daily.   levothyroxine (SYNTHROID) 25 MCG tablet Take 1 tablet (25 mcg total) by mouth daily before breakfast.   QUEtiapine (SEROQUEL) 50 MG tablet Take 1 tablet (50 mg total) by mouth at bedtime.   No facility-administered encounter medications on file as of 12/19/2020.    Follow-up: No follow-ups on file.   Shan Levans, MD

## 2021-01-20 ENCOUNTER — Other Ambulatory Visit: Payer: Self-pay | Admitting: Physician Assistant

## 2021-01-20 ENCOUNTER — Other Ambulatory Visit: Payer: Self-pay

## 2021-01-20 DIAGNOSIS — F1994 Other psychoactive substance use, unspecified with psychoactive substance-induced mood disorder: Secondary | ICD-10-CM

## 2021-01-20 DIAGNOSIS — E039 Hypothyroidism, unspecified: Secondary | ICD-10-CM

## 2021-02-05 ENCOUNTER — Other Ambulatory Visit: Payer: Self-pay

## 2021-02-06 ENCOUNTER — Emergency Department (HOSPITAL_COMMUNITY): Payer: Self-pay

## 2021-02-06 ENCOUNTER — Emergency Department (HOSPITAL_COMMUNITY)
Admission: EM | Admit: 2021-02-06 | Discharge: 2021-02-06 | Disposition: A | Discharge status: Home / Self Care | Payer: Self-pay | Attending: Emergency Medicine | Admitting: Emergency Medicine

## 2021-02-06 ENCOUNTER — Other Ambulatory Visit: Payer: Self-pay

## 2021-02-06 ENCOUNTER — Encounter (HOSPITAL_COMMUNITY): Payer: Self-pay

## 2021-02-06 DIAGNOSIS — R45851 Suicidal ideations: Secondary | ICD-10-CM | POA: Insufficient documentation

## 2021-02-06 DIAGNOSIS — F1010 Alcohol abuse, uncomplicated: Secondary | ICD-10-CM | POA: Insufficient documentation

## 2021-02-06 DIAGNOSIS — F32A Depression, unspecified: Secondary | ICD-10-CM | POA: Diagnosis present

## 2021-02-06 DIAGNOSIS — E039 Hypothyroidism, unspecified: Secondary | ICD-10-CM | POA: Diagnosis present

## 2021-02-06 DIAGNOSIS — W19XXXA Unspecified fall, initial encounter: Secondary | ICD-10-CM

## 2021-02-06 DIAGNOSIS — F1020 Alcohol dependence, uncomplicated: Secondary | ICD-10-CM | POA: Diagnosis present

## 2021-02-06 DIAGNOSIS — S0990XA Unspecified injury of head, initial encounter: Secondary | ICD-10-CM | POA: Insufficient documentation

## 2021-02-06 DIAGNOSIS — F1994 Other psychoactive substance use, unspecified with psychoactive substance-induced mood disorder: Secondary | ICD-10-CM | POA: Diagnosis present

## 2021-02-06 DIAGNOSIS — W07XXXA Fall from chair, initial encounter: Secondary | ICD-10-CM | POA: Insufficient documentation

## 2021-02-06 DIAGNOSIS — F10920 Alcohol use, unspecified with intoxication, uncomplicated: Secondary | ICD-10-CM

## 2021-02-06 DIAGNOSIS — Y908 Blood alcohol level of 240 mg/100 ml or more: Secondary | ICD-10-CM | POA: Insufficient documentation

## 2021-02-06 LAB — CBC WITH DIFFERENTIAL/PLATELET
Abs Immature Granulocytes: 0.02 10*3/uL (ref 0.00–0.07)
Basophils Absolute: 0 10*3/uL (ref 0.0–0.1)
Basophils Relative: 0 %
Eosinophils Absolute: 0.1 10*3/uL (ref 0.0–0.5)
Eosinophils Relative: 1 %
HCT: 49.2 % (ref 39.0–52.0)
Hemoglobin: 16.6 g/dL (ref 13.0–17.0)
Immature Granulocytes: 0 %
Lymphocytes Relative: 37 %
Lymphs Abs: 2.3 10*3/uL (ref 0.7–4.0)
MCH: 34.2 pg — ABNORMAL HIGH (ref 26.0–34.0)
MCHC: 33.7 g/dL (ref 30.0–36.0)
MCV: 101.4 fL — ABNORMAL HIGH (ref 80.0–100.0)
Monocytes Absolute: 0.3 10*3/uL (ref 0.1–1.0)
Monocytes Relative: 5 %
Neutro Abs: 3.5 10*3/uL (ref 1.7–7.7)
Neutrophils Relative %: 57 %
Platelets: 264 10*3/uL (ref 150–400)
RBC: 4.85 MIL/uL (ref 4.22–5.81)
RDW: 12.7 % (ref 11.5–15.5)
WBC: 6.2 10*3/uL (ref 4.0–10.5)
nRBC: 0 % (ref 0.0–0.2)

## 2021-02-06 LAB — RAPID URINE DRUG SCREEN, HOSP PERFORMED
Amphetamines: NOT DETECTED
Barbiturates: NOT DETECTED
Benzodiazepines: POSITIVE — AB
Cocaine: NOT DETECTED
Opiates: NOT DETECTED
Tetrahydrocannabinol: NOT DETECTED

## 2021-02-06 LAB — URINALYSIS, ROUTINE W REFLEX MICROSCOPIC
Bacteria, UA: NONE SEEN
Bilirubin Urine: NEGATIVE
Glucose, UA: NEGATIVE mg/dL
Ketones, ur: NEGATIVE mg/dL
Leukocytes,Ua: NEGATIVE
Nitrite: NEGATIVE
Protein, ur: NEGATIVE mg/dL
Specific Gravity, Urine: 1.004 — ABNORMAL LOW (ref 1.005–1.030)
pH: 5 (ref 5.0–8.0)

## 2021-02-06 LAB — COMPREHENSIVE METABOLIC PANEL
ALT: 30 U/L (ref 0–44)
AST: 23 U/L (ref 15–41)
Albumin: 4.5 g/dL (ref 3.5–5.0)
Alkaline Phosphatase: 101 U/L (ref 38–126)
Anion gap: 9 (ref 5–15)
BUN: 12 mg/dL (ref 6–20)
CO2: 26 mmol/L (ref 22–32)
Calcium: 8.8 mg/dL — ABNORMAL LOW (ref 8.9–10.3)
Chloride: 103 mmol/L (ref 98–111)
Creatinine, Ser: 1.24 mg/dL (ref 0.61–1.24)
GFR, Estimated: >60 mL/min (ref >60)
Glucose, Bld: 119 mg/dL — ABNORMAL HIGH (ref 70–99)
Potassium: 3.6 mmol/L (ref 3.5–5.1)
Sodium: 138 mmol/L (ref 135–145)
Total Bilirubin: 0.4 mg/dL (ref 0.3–1.2)
Total Protein: 8.5 g/dL — ABNORMAL HIGH (ref 6.5–8.1)

## 2021-02-06 LAB — ETHANOL: Alcohol, Ethyl (B): 249 mg/dL — ABNORMAL HIGH (ref <10)

## 2021-02-06 LAB — LIPASE, BLOOD: Lipase: 42 U/L (ref 11–51)

## 2021-02-06 MED ORDER — LORAZEPAM 2 MG/ML IJ SOLN: 0.0000 mg | Freq: Four times a day (QID) | INTRAMUSCULAR | Status: DC

## 2021-02-06 MED ORDER — THIAMINE HCL 100 MG/ML IJ SOLN: 100.0000 mg | Freq: Every day | INTRAMUSCULAR | Status: DC

## 2021-02-06 MED ORDER — QUETIAPINE FUMARATE 50 MG PO TABS: 50.0000 mg | ORAL_TABLET | Freq: Every day | ORAL | 1 refills | Status: DC

## 2021-02-06 MED ORDER — QUETIAPINE FUMARATE 50 MG PO TABS: 50.0000 mg | ORAL_TABLET | Freq: Every day | ORAL | Status: DC

## 2021-02-06 MED ORDER — LORAZEPAM 2 MG/ML IJ SOLN: 0.0000 mg | Freq: Two times a day (BID) | INTRAMUSCULAR | Status: DC

## 2021-02-06 MED ORDER — ARIPIPRAZOLE 5 MG PO TABS
5.0000 mg | ORAL_TABLET | Freq: Every day | ORAL | Status: DC
  Administered 2021-02-06: 5 mg via ORAL
  Filled 2021-02-06: qty 1

## 2021-02-06 MED ORDER — ARIPIPRAZOLE 5 MG PO TABS: 5.0000 mg | ORAL_TABLET | Freq: Every day | ORAL | 1 refills | Status: DC

## 2021-02-06 MED ORDER — LEVOTHYROXINE SODIUM 25 MCG PO TABS
25.0000 ug | ORAL_TABLET | Freq: Every day | ORAL | Status: DC
  Administered 2021-02-06: 25 ug via ORAL
  Filled 2021-02-06: qty 1

## 2021-02-06 MED ORDER — LEVOTHYROXINE SODIUM 25 MCG PO TABS
25.0000 ug | ORAL_TABLET | Freq: Every day | ORAL | 1 refills | Status: DC
Start: 2021-02-06 — End: 2021-12-25

## 2021-02-06 MED ORDER — FAMOTIDINE 20 MG PO TABS
20.0000 mg | ORAL_TABLET | Freq: Two times a day (BID) | ORAL | Status: DC
  Administered 2021-02-06: 20 mg via ORAL
  Filled 2021-02-06: qty 1

## 2021-02-06 MED ORDER — THIAMINE HCL 100 MG PO TABS
100.0000 mg | ORAL_TABLET | Freq: Every day | ORAL | Status: DC
  Administered 2021-02-06: 100 mg via ORAL
  Filled 2021-02-06: qty 1

## 2021-02-06 MED ORDER — ESCITALOPRAM OXALATE 10 MG PO TABS
20.0000 mg | ORAL_TABLET | Freq: Every day | ORAL | Status: DC
  Administered 2021-02-06: 20 mg via ORAL
  Filled 2021-02-06: qty 2

## 2021-02-06 MED ORDER — LORAZEPAM 1 MG PO TABS: 0.0000 mg | ORAL_TABLET | Freq: Two times a day (BID) | ORAL | Status: DC

## 2021-02-06 MED ORDER — LORAZEPAM 1 MG PO TABS: 0.0000 mg | ORAL_TABLET | Freq: Four times a day (QID) | ORAL | Status: DC

## 2021-02-06 MED ORDER — ESCITALOPRAM OXALATE 10 MG PO TABS: 20.0000 mg | ORAL_TABLET | Freq: Every day | ORAL | 1 refills | Status: DC

## 2021-02-06 MED ORDER — LEVOTHYROXINE SODIUM 25 MCG PO TABS: 25.0000 ug | ORAL_TABLET | Freq: Every day | ORAL | Status: DC

## 2021-02-06 NOTE — Discharge Instructions (Signed)
To help you maintain a sober lifestyle, a substance use disorder treatment program may be beneficial to you.  Contact one of the following providers at your earliest opportunity to ask about enrolling their program:       [facility]      [address] [place], [place] [address]      [phone]       [facility]      [address]      [place], [place] [address]      [phone]       [facility] residents only)      [address]      [place], [place] [address]      [phone]       [facility] for [facility] only)      [address]      [place], [place] [address]      [phone]

## 2021-02-06 NOTE — ED Provider Notes (Addendum)
7:23 AM Care assumed from Dr. Manus Gunning.  At time of transfer care, patient is waiting for TTS evaluation after he has already been medically cleared for follow-up of a barstool.  Patient reportedly was having thoughts of hurting himself and will need TTS evaluation.  Anticipate disposition based on their recommendations   Kaitlan Bin, Canary Brim, MD 02/06/21 1115   Per behavioral health team, patient is psychiatrically cleared and can follow-up with outpatient substance abuse clinics.  Patient will be discharged.   Auron Tadros, Canary Brim, MD 02/06/21 [phone]

## 2021-02-06 NOTE — ED Provider Notes (Signed)
[facility]-EMERGENCY DEPT Provider Note   CSN: [id number] Arrival date & time: 02/06/21  0046     History Chief Complaint  Patient presents with   Fall    Pt said that he fell off of a bar stool, pain in the neck and back of head. Also having thoughts of hurting himself tonight    Ian Munoz is a 40 y.o. male.  Patient with history of depression, anxiety, substance abuse here with fall and head injury.  EMS picked him up by Narcotics Anonymous meeting where he was sitting on a barstool.  He admits to being "drunk".  And states he has been drinking since 3 PM.  Unable to quantify how he drinks he has had.  Apparently fell off the barstool backwards and struck his head and complaining of neck and back and head pain.  No loss of consciousness.  No vomiting.  No blood thinner use.  No focal weakness, numbness or tingling.  Denies any chest pain or shortness of breath.  No abdominal pain.  Unable to quantify how many drinks he had today.  Denies any drug use.  States he has not had his depression medication for about a week and has been having thoughts of suicide today without a plan.  Does not want to hurt anyone else and is not hearing any voices  The history is provided by the EMS personnel and the patient.  Fall Associated symptoms include headaches. Pertinent negatives include no chest pain, no abdominal pain and no shortness of breath.      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 Authorizing Provider  ARIPiprazole (ABILIFY) 5 MG tablet Take 1 tablet (5 mg total) by mouth daily. 10/14/20   Mayers, Cari S, PA-C  escitalopram (LEXAPRO) 10 MG tablet Take 2 tablets (20 mg total) by mouth daily. 10/14/20   Mayers, Cari S, PA-C  famotidine (PEPCID) 20 MG tablet Take 1 tablet (20 mg total) by mouth 2 (two) times daily. 10/14/20   Mayers, Cari S, PA-C  levothyroxine (SYNTHROID) 25 MCG tablet Take 1 tablet (25 mcg total) by mouth daily before breakfast. 10/14/20   Mayers, Cari S, PA-C  QUEtiapine (SEROQUEL) 50 MG tablet Take 1 tablet (50 mg total) by mouth at bedtime. 10/14/20   Mayers, Cari S, PA-C    Allergies    Wellbutrin [bupropion]  Review of Systems   Review of  Systems  Constitutional:  Negative for activity change, appetite change and fever.  HENT:  Negative for congestion and rhinorrhea.   Respiratory:  Negative for cough, chest tightness and shortness of breath.   Cardiovascular:  Negative for chest pain.  Gastrointestinal:  Negative for abdominal pain, nausea and vomiting.  Genitourinary:  Negative for dysuria and hematuria.  Musculoskeletal:  Negative for arthralgias.  Skin:  Negative for rash.  Neurological:  Positive for headaches. Negative for syncope and weakness.  Psychiatric/Behavioral:  Positive for decreased concentration, dysphoric mood, self-injury, sleep disturbance and suicidal ideas. The patient is nervous/anxious.    all other systems are negative except as noted in  the HPI and PMH.   Physical Exam Updated Vital Signs SpO2 93%   Physical Exam Vitals and nursing note reviewed.  Constitutional:      General: He is not in acute distress.    Appearance: He is well-developed.     Comments: Slurred speech, oriented to person, place and time  HENT:     Head: Normocephalic.     Comments: Abrasion to occipital scalp.  No active bleeding  No septal hematoma or hematympanum    Mouth/Throat:     Pharynx: No oropharyngeal exudate.  Eyes:     Conjunctiva/sclera: Conjunctivae normal.     Pupils: Pupils are equal, round, and reactive to light.  Neck:     Comments: C-collar in place.  Paraspinal tenderness diffusely.  No midline tenderness or step-off Cardiovascular:     Rate and Rhythm: Normal rate and regular rhythm.     Heart sounds: Normal heart sounds. No murmur heard. Pulmonary:     Effort: Pulmonary effort is normal. No respiratory distress.     Breath sounds: Normal breath sounds.  Abdominal:     Palpations: Abdomen is soft.     Tenderness: There is no abdominal tenderness. There is no guarding or rebound.  Musculoskeletal:        General: No tenderness. Normal range of motion.     Cervical back: Normal range of motion and neck supple.     Comments: No T or L spine tenderness  Skin:    General: Skin is warm.  Neurological:     General: No focal deficit present.     Mental Status: He is alert and oriented to person, place, and time. Mental status is at baseline.     Cranial Nerves: No cranial nerve deficit.     Motor: No abnormal muscle tone.     Coordination: Coordination normal.     Comments:  5/5 strength throughout. CN 2-12 intact.Equal grip strength.   Psychiatric:        Behavior: Behavior normal.    ED Results / Procedures / Treatments   Labs (all labs ordered are listed, but only abnormal results are displayed) Labs Reviewed  CBC WITH DIFFERENTIAL/PLATELET - Abnormal; Notable for the following components:      Result Value    MCV 101.4 (*)    MCH 34.2 (*)    All other components within normal limits  COMPREHENSIVE METABOLIC PANEL - Abnormal; Notable for the following components:   Glucose, Bld 119 (*)    Calcium 8.8 (*)    Total Protein 8.5 (*)    All other components within normal limits  URINALYSIS, ROUTINE W REFLEX MICROSCOPIC - Abnormal; Notable for the following components:   Color, Urine STRAW (*)    Specific Gravity, Urine 1.004 (*)    Hgb urine dipstick SMALL (*)    All  other components within normal limits  RAPID URINE DRUG SCREEN, HOSP PERFORMED - Abnormal; Notable for the following components:   Benzodiazepines POSITIVE (*)    All other components within normal limits  ETHANOL - Abnormal; Notable for the following components:   Alcohol, Ethyl (B) 249 (*)    All other components within normal limits  LIPASE, BLOOD    EKG EKG Interpretation  Date/Time:  Thursday February 06 2021 01:18:27 EDT Ventricular Rate:  75 PR Interval:  146 QRS Duration: 86 QT Interval:  371 QTC Calculation: 415 R Axis:   56 Text Interpretation: Sinus rhythm No significant change was found Confirmed by Glynn Octave [phone]) on 02/06/2021 1:29:20 AM  Radiology CT Head Wo Contrast  Result Date: 02/06/2021 CLINICAL DATA:  Fall from bar stool with headaches and neck pain, initial encounter EXAM: CT HEAD WITHOUT CONTRAST CT CERVICAL SPINE WITHOUT CONTRAST TECHNIQUE: Multidetector CT imaging of the head and cervical spine was performed following the standard protocol without intravenous contrast. Multiplanar CT image reconstructions of the cervical spine were also generated. COMPARISON:  CT of the head from 10/07/2020, CT of the cervical spine from 05/01/2020 FINDINGS: CT HEAD FINDINGS Brain: No evidence of acute infarction, hemorrhage, hydrocephalus, extra-axial collection or mass lesion/mass effect. Vascular: No hyperdense vessel or unexpected calcification. Skull: Normal. Negative for fracture or focal lesion.  Sinuses/Orbits: No acute finding. Other: Mild scalp hematoma is noted in the left posterior parietal region near the vertex. CT CERVICAL SPINE FINDINGS Alignment: Mild straightening of the normal cervical lordosis is noted. Skull base and vertebrae: 7 cervical segments are well visualized. Vertebral body height is well maintained. No acute fracture or acute facet abnormality is noted. Soft tissues and spinal canal: Surrounding soft tissue structures are within normal limits. Upper chest: Visualized lung apices are unremarkable. Other: None IMPRESSION: CT of the head: No acute intracranial abnormality noted. Scalp hematoma in the left parietal region near the vertex. CT of the cervical spine: No acute abnormality noted. Electronically Signed   By: Alcide Clever M.D.   On: 02/06/2021 02:21   CT Cervical Spine Wo Contrast  Result Date: 02/06/2021 CLINICAL DATA:  Fall from bar stool with headaches and neck pain, initial encounter EXAM: CT HEAD WITHOUT CONTRAST CT CERVICAL SPINE WITHOUT CONTRAST TECHNIQUE: Multidetector CT imaging of the head and cervical spine was performed following the standard protocol without intravenous contrast. Multiplanar CT image reconstructions of the cervical spine were also generated. COMPARISON:  CT of the head from 10/07/2020, CT of the cervical spine from 05/01/2020 FINDINGS: CT HEAD FINDINGS Brain: No evidence of acute infarction, hemorrhage, hydrocephalus, extra-axial collection or mass lesion/mass effect. Vascular: No hyperdense vessel or unexpected calcification. Skull: Normal. Negative for fracture or focal lesion. Sinuses/Orbits: No acute finding. Other: Mild scalp hematoma is noted in the left posterior parietal region near the vertex. CT CERVICAL SPINE FINDINGS Alignment: Mild straightening of the normal cervical lordosis is noted. Skull base and vertebrae: 7 cervical segments are well visualized. Vertebral body height is well maintained. No acute fracture or acute facet  abnormality is noted. Soft tissues and spinal canal: Surrounding soft tissue structures are within normal limits. Upper chest: Visualized lung apices are unremarkable. Other: None IMPRESSION: CT of the head: No acute intracranial abnormality noted. Scalp hematoma in the left parietal region near the vertex. CT of the cervical spine: No acute abnormality noted. Electronically Signed   By: Alcide Clever M.D.   On: 02/06/2021 02:21    Procedures Procedures   Medications Ordered in  ED Medications - No data to display  ED Course  I have reviewed the triage vital signs and the nursing notes.  Pertinent labs & imaging results that were available during my care of the patient were reviewed by me and considered in my medical decision making (see chart for details).    MDM Rules/Calculators/A&P                           Intoxicated fall with head injury.  No loss of consciousness.  No vomiting.  Abrasion to occipital scalp.  Neurologically intact. Given his intoxication will obtain imaging.  Will be allowed to sober.  May need TTS evaluation if he continues to be suicidal.  Labs show alcohol intoxication.  CT head and C-spine are obtained given his intoxication and mechanism of injury.  This is negative for acute traumatic injury.  TTS consult will be obtained  CT head and C-spine are negative.  Labs are reassuring other than alcohol intoxication.  Patient is calm and cooperative.  He is medically clear for TTS evaluation. Final Clinical Impression(s) / ED Diagnoses Final diagnoses:  None    Rx / DC Orders ED Discharge Orders     None        Marcianna Daily, Jeannett Senior, MD 02/06/21 [phone]

## 2021-02-06 NOTE — ED Triage Notes (Signed)
Pt said that he has been having thoughts with no plan

## 2021-02-06 NOTE — ED Notes (Signed)
Placed patients belongings on the top shelf of cabinet at nurses desk.

## 2021-02-06 NOTE — ED Notes (Signed)
Pt belonging: blue jeans, pair of shoes, sweater, black wallet, shirt

## 2021-02-06 NOTE — Consult Note (Signed)
[facility] Psych ED Discharge  02/06/2021 12:08 PM BENEDICTO CAPOZZI  MRN:  [id number]  Method of visit?: Face to Face   Principal Problem: Substance induced mood disorder [facility]) Discharge Diagnoses: Principal Problem:   Substance induced mood disorder (HCC) Active Problems:   Alcohol use disorder, severe, dependence (HCC)   Hypothyroidism   Depression   Subjective: Ian Munoz is a 40 year old male who presents after falling off a stool and hitting his head while under the influence of alcohol. He states his depression has increased since he ran out of meds a few days, around the same time he began to drink. Patient reports he was sober for 30 days prior to drinking 3 days ago. He states he was able to rest and feels much better on evaluation this morning. He shows much interest in expressing interest in quitting his drinking "for good and dislikes the way it makes him feel. " He denies any suicidal ideations, homicidal ideations, and or hallucinations. He denies any current intent to self harm or injury himself. At this time he appears to be psychiatrically cleared to discharge. Will authorize refills which have been sent to his local pharmacy, in addition to providing resources for rehab.   HPI: Patient with history of depression, anxiety, substance abuse here with fall and head injury.  EMS picked him up by Narcotics Anonymous meeting where he was sitting on a barstool.  He admits to being "drunk".  And states he has been drinking since 3 PM.  Unable to quantify how he drinks he has had.  Apparently fell off the barstool backwards and struck his head and complaining of neck and back and head pain.  No loss of consciousness.  No vomiting.  No blood thinner use.  No focal weakness, numbness or tingling.  Denies any chest pain or shortness of breath.  No abdominal pain.   Unable to quantify how many drinks he had today.  Denies any drug use.  States he has not had his depression medication for about a  week and has been having thoughts of suicide today without a plan.  Does not want to hurt anyone else and is not hearing any voices  Total Time spent with patient: 30 minutes  Past Psychiatric History:   Past Medical History:  Past Medical History:  Diagnosis Date   Depression    Prostate enlargement     Past Surgical History:  Procedure Laterality Date   EYE SURGERY     WRIST SURGERY Right    Family History:  Family History  Family history unknown: Yes   Family Psychiatric  History:  Social History:  Social History   Substance and Sexual Activity  Alcohol Use Yes   Alcohol/week: 5.0 standard drinks   Types: 5 Cans of beer per week     Social History   Substance and Sexual Activity  Drug Use No    Social History   Socioeconomic History   Marital status: Single    Spouse name: Not on file   Number of children: Not on file   Years of education: Not on file   Highest education level: Not on file  Occupational History   Not on file  Tobacco Use   Smoking status: Never   Smokeless tobacco: Current    Types: Chew  Substance and Sexual Activity   Alcohol use: Yes    Alcohol/week: 5.0 standard drinks    Types: 5 Cans of beer per week   Drug  use: No   Sexual activity: Yes    Birth control/protection: None  Other Topics Concern   Not on file  Social History Narrative   Not on file   Social Determinants of Health   Financial Resource Strain: Not on file  Food Insecurity: Not on file  Transportation Needs: Not on file  Physical Activity: Not on file  Stress: Not on file  Social Connections: Not on file    Tobacco Cessation:  N/A, patient does not currently use tobacco products  Current Medications: Current Facility-Administered Medications  Medication Dose Route Frequency Provider Last Rate Last Admin   ARIPiprazole (ABILIFY) tablet 5 mg  5 mg Oral Daily Rancour, Stephen, MD   5 mg at 02/06/21 1007   escitalopram (LEXAPRO) tablet 20 mg  20 mg Oral  Daily Rancour, Stephen, MD   20 mg at 02/06/21 1007   famotidine (PEPCID) tablet 20 mg  20 mg Oral BID Rancour, Jeannett Senior, MD   20 mg at 02/06/21 1007   levothyroxine (SYNTHROID) tablet 25 mcg  25 mcg Oral Q0600 Tegeler, Canary Brim, MD   25 mcg at 02/06/21 1010   LORazepam (ATIVAN) injection 0-4 mg  0-4 mg Intravenous Q6H Rancour, Stephen, MD       Or   LORazepam (ATIVAN) tablet 0-4 mg  0-4 mg Oral Q6H Rancour, Stephen, MD       Melene Muller ON 02/08/2021] LORazepam (ATIVAN) injection 0-4 mg  0-4 mg Intravenous Q12H Rancour, Jeannett Senior, MD       Or   Melene Muller ON 02/08/2021] LORazepam (ATIVAN) tablet 0-4 mg  0-4 mg Oral Q12H Rancour, Stephen, MD       QUEtiapine (SEROQUEL) tablet 50 mg  50 mg Oral QHS Rancour, Stephen, MD       thiamine tablet 100 mg  100 mg Oral Daily Rancour, Stephen, MD   100 mg at 02/06/21 1007   Or   thiamine (B-1) injection 100 mg  100 mg Intravenous Daily Rancour, Stephen, MD       Current Outpatient Medications  Medication Sig Dispense Refill   ARIPiprazole (ABILIFY) 5 MG tablet Take 1 tablet (5 mg total) by mouth daily. 30 tablet 1   escitalopram (LEXAPRO) 10 MG tablet Take 2 tablets (20 mg total) by mouth daily. 60 tablet 1   famotidine (PEPCID) 20 MG tablet Take 1 tablet (20 mg total) by mouth 2 (two) times daily. 60 tablet 0   levothyroxine (SYNTHROID) 25 MCG tablet Take 1 tablet (25 mcg total) by mouth daily before breakfast. 30 tablet 1   naltrexone (DEPADE) 50 MG tablet Take 25-50 mg by mouth daily.     QUEtiapine (SEROQUEL) 50 MG tablet Take 1 tablet (50 mg total) by mouth at bedtime. 30 tablet 1   PTA Medications: (Not in a hospital admission)   Musculoskeletal: Strength & Muscle Tone: within normal limits Gait & Station: normal Patient leans: N/A  Psychiatric Specialty Exam:  Presentation  General Appearance: Appropriate for Environment; Casual  Eye Contact:Fair  Speech:Clear and Coherent; Slow  Speech Volume:Normal  Handedness:Right   Mood and  Affect  Mood:Anxious  Affect:Appropriate; Congruent   Thought Process  Thought Processes:Coherent; Linear  Descriptions of Associations:Intact  Orientation:Full (Time, Place and Person)  Thought Content:Logical; WDL  History of Schizophrenia/Schizoaffective disorder:No  Duration of Psychotic Symptoms:Greater than six months  Hallucinations:Hallucinations: None  Ideas of Reference:None  Suicidal Thoughts:Suicidal Thoughts: No  Homicidal Thoughts:Homicidal Thoughts: No   Sensorium  Memory:Immediate Good; Recent Good; Remote Good  Judgment:Fair  Insight:Fair  Executive Functions  Concentration:Good  Attention Span:Good  Recall:Good  Fund of Knowledge:Good  Language:Good   Psychomotor Activity  Psychomotor Activity:Psychomotor Activity: Normal   Assets  Assets:Communication Skills; Desire for Improvement; Financial Resources/Insurance; Housing; Resilience; Social Support; Leisure Time   Sleep  Sleep:Sleep: Good    Physical Exam: Physical Exam Vitals and nursing note reviewed.  Constitutional:      Appearance: Normal appearance. He is normal weight.  HENT:     Head: Normocephalic.  Neurological:     Mental Status: He is alert.  Psychiatric:        Mood and Affect: Mood normal.        Behavior: Behavior normal.        Thought Content: Thought content normal.        Judgment: Judgment normal.   Review of Systems  Neurological:  Positive for headaches.  Psychiatric/Behavioral:  Positive for depression and substance abuse (alcohol). Negative for hallucinations, memory loss and suicidal ideas. The patient is nervous/anxious. The patient does not have insomnia.   Blood pressure 117/75, pulse 89, temperature 98.2 F (36.8 C), temperature source Oral, resp. rate 20, height 5\' 5"  (1.651 m), weight 79.4 kg, SpO2 99 %. Body mass index is 29.12 kg/m.   Demographic Factors:  Male, Caucasian, and Low socioeconomic status  Loss Factors: Decrease in  vocational status and Financial problems/change in socioeconomic status  Historical Factors: Impulsivity  Risk Reduction Factors:   Sense of responsibility to family, Positive social support, Positive therapeutic relationship, and Positive coping skills or problem solving skills  Continued Clinical Symptoms:  Depression:   Impulsivity Alcohol/Substance Abuse/Dependencies More than one psychiatric diagnosis Previous Psychiatric Diagnoses and Treatments  Cognitive Features That Contribute To Risk:  None    Suicide Risk:  Minimal: No identifiable suicidal ideation.  Patients presenting with no risk factors but with morbid ruminations; may be classified as minimal risk based on the severity of the depressive symptoms    Plan Of Care/Follow-up recommendations:  Other:  We have provided residential resources for alcohol use, please follow up for intake and admission.  YOur medications have been sent to the pharmacy, please resume them for stabilization and improvement in you rmental health.   Disposition: Psych cleared at this time.  , FNP 02/06/2021, 12:08 PM

## 2021-02-06 NOTE — BH Assessment (Signed)
[facility] Assessment Progress Note   Per Caryn Bee, NP, this voluntary pt does not require psychiatric hospitalization at this time.  Pt is psychiatrically cleared.  Discharge instructions include referral information for area substance abuse treatment providers.  EDP Lynden [name], MD and pt's nurse, Jonny Ruiz, have been notified.  Doylene Canning, MA Triage Specialist [phone]

## 2021-02-17 ENCOUNTER — Other Ambulatory Visit: Payer: Self-pay

## 2021-02-17 ENCOUNTER — Encounter (HOSPITAL_COMMUNITY): Payer: Self-pay

## 2021-02-17 ENCOUNTER — Emergency Department (HOSPITAL_COMMUNITY)
Admission: EM | Admit: 2021-02-17 | Discharge: 2021-02-17 | Disposition: A | Discharge status: Home / Self Care | Payer: Self-pay | Attending: Emergency Medicine | Admitting: Emergency Medicine

## 2021-02-17 DIAGNOSIS — Z0279 Encounter for issue of other medical certificate: Secondary | ICD-10-CM | POA: Insufficient documentation

## 2021-02-17 DIAGNOSIS — E039 Hypothyroidism, unspecified: Secondary | ICD-10-CM | POA: Insufficient documentation

## 2021-02-17 DIAGNOSIS — F1910 Other psychoactive substance abuse, uncomplicated: Secondary | ICD-10-CM | POA: Insufficient documentation

## 2021-02-17 DIAGNOSIS — Z0000 Encounter for general adult medical examination without abnormal findings: Secondary | ICD-10-CM

## 2021-02-17 DIAGNOSIS — R197 Diarrhea, unspecified: Secondary | ICD-10-CM | POA: Insufficient documentation

## 2021-02-17 DIAGNOSIS — F17200 Nicotine dependence, unspecified, uncomplicated: Secondary | ICD-10-CM | POA: Insufficient documentation

## 2021-02-17 DIAGNOSIS — Z79899 Other long term (current) drug therapy: Secondary | ICD-10-CM | POA: Insufficient documentation

## 2021-02-17 DIAGNOSIS — Z111 Encounter for screening for respiratory tuberculosis: Secondary | ICD-10-CM | POA: Insufficient documentation

## 2021-02-17 LAB — COMPREHENSIVE METABOLIC PANEL
ALT: 41 U/L (ref 0–44)
AST: 26 U/L (ref 15–41)
Albumin: 4.5 g/dL (ref 3.5–5.0)
Alkaline Phosphatase: 109 U/L (ref 38–126)
Anion gap: 5 (ref 5–15)
BUN: 11 mg/dL (ref 6–20)
CO2: 28 mmol/L (ref 22–32)
Calcium: 9.1 mg/dL (ref 8.9–10.3)
Chloride: 103 mmol/L (ref 98–111)
Creatinine, Ser: 0.87 mg/dL (ref 0.61–1.24)
GFR, Estimated: >60 mL/min (ref >60)
Glucose, Bld: 106 mg/dL — ABNORMAL HIGH (ref 70–99)
Potassium: 3.8 mmol/L (ref 3.5–5.1)
Sodium: 136 mmol/L (ref 135–145)
Total Bilirubin: 1 mg/dL (ref 0.3–1.2)
Total Protein: 7.8 g/dL (ref 6.5–8.1)

## 2021-02-17 LAB — CBC
HCT: 44.9 % (ref 39.0–52.0)
Hemoglobin: 15.5 g/dL (ref 13.0–17.0)
MCH: 34.4 pg — ABNORMAL HIGH (ref 26.0–34.0)
MCHC: 34.5 g/dL (ref 30.0–36.0)
MCV: 99.8 fL (ref 80.0–100.0)
Platelets: 268 10*3/uL (ref 150–400)
RBC: 4.5 MIL/uL (ref 4.22–5.81)
RDW: 12.9 % (ref 11.5–15.5)
WBC: 5 10*3/uL (ref 4.0–10.5)
nRBC: 0 % (ref 0.0–0.2)

## 2021-02-17 LAB — ETHANOL: Alcohol, Ethyl (B): <10 mg/dL (ref <10)

## 2021-02-17 LAB — ACETAMINOPHEN LEVEL: Acetaminophen (Tylenol), Serum: <10 ug/mL — ABNORMAL LOW (ref 10–30)

## 2021-02-17 LAB — RAPID URINE DRUG SCREEN, HOSP PERFORMED
Amphetamines: NOT DETECTED
Barbiturates: NOT DETECTED
Benzodiazepines: POSITIVE — AB
Cocaine: NOT DETECTED
Opiates: NOT DETECTED
Tetrahydrocannabinol: POSITIVE — AB

## 2021-02-17 LAB — SALICYLATE LEVEL: Salicylate Lvl: <7 mg/dL — ABNORMAL LOW (ref 7.0–30.0)

## 2021-02-17 MED ORDER — TUBERCULIN PPD 5 UNIT/0.1ML ID SOLN
5.0000 [IU] | INTRADERMAL | Status: DC
  Administered 2021-02-17: 5 [IU] via INTRADERMAL
  Filled 2021-02-17: qty 0.1

## 2021-02-17 NOTE — ED Triage Notes (Signed)
Pt reports already being accepted at [facility] in [place] and reports needing a physical and TB test for medical clearance. Denies any symptoms after this time.

## 2021-02-17 NOTE — ED Provider Notes (Signed)
[facility]-EMERGENCY DEPT Provider Note   CSN: [id number] Arrival date & time: 02/17/21  1018     History Chief Complaint  Patient presents with   Medical Clearance    Ian Munoz is a 40 y.o. male.  Patient is a 41 year old male with a history of polysubstance use, anxiety, depression who is presenting today requesting a TB skin test and a physical exam so that he can go to a rehab facility.  Patient has been accepted to the [facility] in [place].  He currently reports he last had any alcohol on Friday and he has had a little bit of diarrhea but otherwise has been feeling okay.  He denies any severe tremors or withdrawal symptoms.  He has had no nausea vomiting, chest pain, palpitations or shortness of breath.  He is currently off all of his psychiatric medications because he felt that it was not really helping and discontinued them 3 weeks ago.  He last used methamphetamine weeks ago.  The history is provided by the patient.      Past Medical History:  Diagnosis Date   Depression    Prostate enlargement     Patient Active Problem List   Diagnosis Date Noted   Alcohol-induced mood disorder (HCC)    Suicidal ideation    Homicidal ideation    Alcohol abuse with intoxication (HCC)    Hypothyroidism 09/17/2020   Gastroesophageal reflux disease without esophagitis 09/17/2020   Alcohol use disorder, severe, dependence (HCC) 08/07/2020   Methamphetamine abuse (HCC) 08/07/2020   Substance induced mood disorder (HCC)    Severe recurrent major depression without psychotic features (HCC) 08/06/2019   Depression 11/14/2014    Past Surgical History:  Procedure Laterality Date   EYE SURGERY     WRIST SURGERY Right        Family History  Family history unknown: Yes    Social History   Tobacco Use   Smoking status: Never   Smokeless tobacco: Current    Types: Chew  Substance Use Topics   Alcohol use: Yes    Alcohol/week: 5.0 standard drinks     Types: 5 Cans of beer per week   Drug use: No    Home Medications Prior to Admission medications   Medication Sig Start Date End Date Taking? Authorizing Provider  ARIPiprazole (ABILIFY) 5 MG tablet Take 1 tablet (5 mg total) by mouth daily. 02/06/21   Starkes-Perry, Juel Burrow, FNP  escitalopram (LEXAPRO) 10 MG tablet Take 2 tablets (20 mg total) by mouth daily. 02/06/21   Maryagnes Amos, FNP  levothyroxine (SYNTHROID) 25 MCG tablet Take 1 tablet (25 mcg total) by mouth daily before breakfast. 02/06/21   Starkes-Perry, Juel Burrow, FNP  naltrexone (DEPADE) 50 MG tablet Take 25-50 mg by mouth daily. 12/24/20   [provider]  QUEtiapine (SEROQUEL) 50 MG tablet Take 1 tablet (50 mg total) by mouth at bedtime. 02/06/21   Maryagnes Amos, FNP    Allergies    Wellbutrin [bupropion]  Review of Systems   Review of Systems  All other systems reviewed and are negative.  Physical Exam Updated Vital Signs BP (!) 141/89   Pulse 97   Temp 98.1 F (36.7 C)   Resp 18   SpO2 99%   Physical Exam Vitals and nursing note reviewed.  Constitutional:      General: He is not in acute distress.    Appearance: Normal appearance. He is well-developed.  HENT:  Head: Normocephalic and atraumatic.  Eyes:     Conjunctiva/sclera: Conjunctivae normal.     Pupils: Pupils are equal, round, and reactive to light.  Cardiovascular:     Rate and Rhythm: Normal rate and regular rhythm.     Heart sounds: No murmur heard. Pulmonary:     Effort: Pulmonary effort is normal. No respiratory distress.     Breath sounds: Normal breath sounds. No wheezing or rales.  Abdominal:     General: There is no distension.     Palpations: Abdomen is soft.     Tenderness: There is no abdominal tenderness. There is no guarding or rebound.  Musculoskeletal:        General: No tenderness. Normal range of motion.     Cervical back: Normal range of motion and neck supple.  Skin:    General: Skin is  warm and dry.     Findings: No erythema or rash.  Neurological:     Mental Status: He is alert and oriented to person, place, and time. Mental status is at baseline.     Comments: No notable tremors  Psychiatric:        Mood and Affect: Mood normal.        Behavior: Behavior normal.        Thought Content: Thought content is not paranoid. Thought content does not include homicidal or suicidal ideation.    ED Results / Procedures / Treatments   Labs (all labs ordered are listed, but only abnormal results are displayed) Labs Reviewed  CBC - Abnormal; Notable for the following components:      Result Value   MCH 34.4 (*)    All other components within normal limits  COMPREHENSIVE METABOLIC PANEL  ETHANOL  SALICYLATE LEVEL  ACETAMINOPHEN LEVEL  RAPID URINE DRUG SCREEN, HOSP PERFORMED    EKG None  Radiology No results found.  Procedures Procedures   Medications Ordered in ED Medications  tuberculin injection 5 Units (has no administration in time range)    ED Course  I have reviewed the triage vital signs and the nursing notes.  Pertinent labs & imaging results that were available during my care of the patient were reviewed by me and considered in my medical decision making (see chart for details).    MDM Rules/Calculators/A&P                           Patient is a 40 year old male presenting today with complaints of needing a physical exam and TB test.  Patient actually has no complaints and just wants to go to a rehab facility.  His exam was normal today.  No findings consistent with alcohol withdrawal.  He is awake alert and oriented.  No significant tremor.  No significant tachycardia or hypertension.  TB test was placed and patient will be discharged to go to the rehab facility.   Final Clinical Impression(s) / ED Diagnoses Final diagnoses:  Polysubstance abuse [facility])  Physical exam    Rx / DC Orders ED Discharge Orders     None        Gwyneth Sprout, MD 02/17/21 1139

## 2021-02-17 NOTE — Discharge Instructions (Signed)
TB skin test placed on 02/17/21.  Physical exam is normal.  Clear for rehab.

## 2021-11-14 ENCOUNTER — Other Ambulatory Visit: Payer: Self-pay | Admitting: Family Medicine

## 2021-11-14 DIAGNOSIS — N50812 Left testicular pain: Secondary | ICD-10-CM

## 2021-11-14 DIAGNOSIS — I861 Scrotal varices: Secondary | ICD-10-CM

## 2021-11-17 ENCOUNTER — Ambulatory Visit
Admission: RE | Admit: 2021-11-17 | Discharge: 2021-11-17 | Disposition: A | Discharge status: Home / Self Care | Payer: BC Managed Care – PPO | Source: Ambulatory Visit | Attending: Family Medicine | Admitting: Family Medicine

## 2021-11-17 ENCOUNTER — Encounter: Payer: Self-pay | Admitting: *Deleted

## 2021-11-17 DIAGNOSIS — N50812 Left testicular pain: Secondary | ICD-10-CM

## 2021-11-17 DIAGNOSIS — I861 Scrotal varices: Secondary | ICD-10-CM

## 2021-11-17 HISTORY — PX: IR RADIOLOGIST EVAL & MGMT: IMG5224

## 2021-11-17 NOTE — Consult Note (Signed)
Chief Complaint: Patient was seen in consultation today for left varicocele at the request of Jeanes,Sarah  Referring Physician(s): Jeanes,Sarah  History of Present Illness: Ian Munoz is a 40 y.o. male with a longstanding history of a known and palpable left varicocele for approximately 10 to 15 years.  He was seen by a urologist in [place] many years ago who did not recommend surgery or other intervention at that time.  He has had chronic left-sided scrotal pain for at least 10 years which has been increasing in severity for the last 1 to 2 years.  He has pain on a daily basis and usually has some degree of pain throughout the day.  He describes the pain as mostly a "dull and achy pain" which is at times sharp.  The pain is exacerbated by prolonged standing and strenuous activity.  Pain is lessened by lying down for prolonged periods of time and after sleeping.  He has tried chronic use of a jockstrap which has helped somewhat but has not relieved his chronic pain.  Ian Munoz previously has had episodes of prostatitis.  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  He has been homeless previously and has successfully been through drug and alcohol rehab and lives in a halfway house.  Past Medical History:  Diagnosis Date   Depression    Prostate enlargement     Past Surgical History:  Procedure Laterality Date   EYE SURGERY     WRIST SURGERY Right     Allergies: Wellbutrin [bupropion]  Medications: Prior to Admission medications   Medication Sig Start Date End Date Taking? Authorizing Provider  ARIPiprazole (ABILIFY) 5 MG tablet Take 1 tablet (5 mg total) by mouth daily. 02/06/21   Starkes-Perry, Gayland Curry, FNP  escitalopram (LEXAPRO) 10 MG tablet Take 2 tablets (20 mg total) by mouth daily. 02/06/21   Suella Broad, FNP  levothyroxine (SYNTHROID) 25 MCG tablet Take 1 tablet (25 mcg total) by mouth daily before breakfast. 02/06/21    Starkes-Perry, Gayland Curry, FNP  naltrexone (DEPADE) 50 MG tablet Take 25-50 mg by mouth daily. 12/24/20   [provider]  QUEtiapine (SEROQUEL) 50 MG tablet Take 1 tablet (50 mg total) by mouth at bedtime. 02/06/21   Starkes-Perry, Gayland Curry, FNP     Family History  Family history unknown: Yes    Social History   Socioeconomic History   Marital status: Single    Spouse name: Not on file   Number of children: Not on file   Years of education: Not on file   Highest education level: Not on file  Occupational History   Not on file  Tobacco Use   Smoking status: Never   Smokeless tobacco: Current    Types: Chew  Substance and Sexual Activity   Alcohol use: Yes    Alcohol/week: 5.0 standard drinks of alcohol    Types: 5 Cans of beer per week   Drug use: No   Sexual activity: Yes    Birth control/protection: None  Other Topics Concern   Not on file  Social History Narrative   Not on file   Social Determinants of Health   Financial Resource Strain: Not on file  Food Insecurity: Not on file  Transportation Needs: Not on file  Physical Activity: Not on file  Stress: Not on file  Social Connections: Not on file     Review of Systems: A 12 point ROS discussed and pertinent positives are  indicated in the HPI above.  All other systems are negative.  Review of Systems  Constitutional: Negative.   Respiratory: Negative.    Gastrointestinal: Negative.   Genitourinary:  Positive for scrotal swelling and testicular pain. Negative for difficulty urinating, dysuria, flank pain, hematuria and penile pain.  Musculoskeletal: Negative.   Neurological: Negative.     Vital Signs: BP 122/80 (BP Location: Left Arm)   Pulse 80   SpO2 97%    Physical Exam Vitals reviewed.  Constitutional:      General: He is not in acute distress.    Appearance: Normal appearance. He is not ill-appearing, toxic-appearing or diaphoretic.  Cardiovascular:     Rate and Rhythm: Normal rate and  regular rhythm.     Heart sounds: Normal heart sounds. No murmur heard.    No friction rub. No gallop.  Pulmonary:     Effort: Pulmonary effort is normal. No respiratory distress.     Breath sounds: Normal breath sounds. No stridor. No wheezing, rhonchi or rales.  Abdominal:     General: Bowel sounds are normal. There is no distension.     Palpations: Abdomen is soft. There is no mass.     Tenderness: There is no abdominal tenderness. There is no guarding or rebound.     Hernia: No hernia is present.  Genitourinary:    Penis: Normal.      Testes: Normal.     Comments: Palpable 3+ left-sided varicocele with increased distention with Valsalva.  Left testis is smaller than the right with roughly 25% reduction in volume. Musculoskeletal:        General: No swelling.  Skin:    General: Skin is warm and dry.  Neurological:     General: No focal deficit present.     Mental Status: He is alert and oriented to person, place, and time.    Imaging: See below.  Labs:  CBC: Recent Labs    02/06/21 0130 02/17/21 1028  WBC 6.2 5.0  HGB 16.6 15.5  HCT 49.2 44.9  PLT 264 268    COAGS: No results for input(s): "INR", "APTT" in the last 8760 hours.  BMP: Recent Labs    02/06/21 0130 02/17/21 1028  NA 138 136  K 3.6 3.8  CL 103 103  CO2 26 28  GLUCOSE 119* 106*  BUN 12 11  CALCIUM 8.8* 9.1  CREATININE 1.24 0.87  GFRNONAA >60 >60    LIVER FUNCTION TESTS: Recent Labs    02/06/21 0130 02/17/21 1028  BILITOT 0.4 1.0  AST 23 26  ALT 30 41  ALKPHOS 101 109  PROT 8.5* 7.8  ALBUMIN 4.5 4.5    Assessment and Plan:  I met with Ian Munoz.  We reviewed his prior scrotal ultrasound studies on 10/16/2021, 08/06/2020 and 03/13/2020, all demonstrating very prominent left-sided varicocele that distends with Valsalva maneuver and decreased volume of the left testicle relative to the right.  On the most recent study left testicular volume is approximately 22% smaller than the right.    Ian Munoz clearly has significant chronic pain related to a persistent and prominent left-sided varicocele.  This pain affects his activities on a daily basis.  There is clearly an indication for treatment.  We discussed treatment options including surgical ligation and transcatheter embolization.  Details of gonadal venography and transcatheter embolization were discussed.  The procedure is performed under conscious sedation on an outpatient basis.  I told him that he could resume work a few days after the  procedure.  After discussion, Ian Munoz would like to schedule gonadal venography to confirm an incompetent left testicular vein with reflux into a left varicocele and transcatheter embolization of a left varicocele at that time.  We will begin the authorization and scheduling process at [facility].  Thank you for this interesting consult.  I greatly enjoyed meeting Ian Munoz and look forward to participating in their care.  A copy of this report was sent to the requesting provider on this date.  Electronically Signed: Azzie Roup 11/17/2021, 3:47 PM    I spent a total of  30 Minutes   in face to face in clinical consultation, greater than 50% of which was counseling/coordinating care for a symptomatic left varicocele.

## 2021-12-16 ENCOUNTER — Other Ambulatory Visit (HOSPITAL_COMMUNITY): Payer: Self-pay | Admitting: Interventional Radiology

## 2021-12-16 DIAGNOSIS — I861 Scrotal varices: Secondary | ICD-10-CM

## 2021-12-25 ENCOUNTER — Other Ambulatory Visit: Payer: Self-pay | Admitting: Radiology

## 2021-12-25 DIAGNOSIS — I861 Scrotal varices: Secondary | ICD-10-CM

## 2021-12-29 ENCOUNTER — Other Ambulatory Visit (HOSPITAL_COMMUNITY): Payer: Self-pay | Admitting: Interventional Radiology

## 2021-12-29 ENCOUNTER — Ambulatory Visit (HOSPITAL_COMMUNITY)
Admission: RE | Admit: 2021-12-29 | Discharge: 2021-12-29 | Disposition: A | Discharge status: Home / Self Care | Payer: BLUE CROSS/BLUE SHIELD | Source: Ambulatory Visit | Attending: Interventional Radiology | Admitting: Interventional Radiology

## 2021-12-29 ENCOUNTER — Other Ambulatory Visit: Payer: Self-pay

## 2021-12-29 DIAGNOSIS — F32A Depression, unspecified: Secondary | ICD-10-CM | POA: Diagnosis not present

## 2021-12-29 DIAGNOSIS — I861 Scrotal varices: Secondary | ICD-10-CM

## 2021-12-29 HISTORY — PX: IR ANGIOGRAM FOLLOW UP STUDY: IMG697

## 2021-12-29 HISTORY — PX: IR ANGIOGRAM SELECTIVE EACH ADDITIONAL VESSEL: IMG667

## 2021-12-29 HISTORY — PX: IR US GUIDE VASC ACCESS RIGHT: IMG2390

## 2021-12-29 HISTORY — PX: IR EMBO VENOUS NOT HEMORR HEMANG  INC GUIDE ROADMAPPING: IMG5447

## 2021-12-29 HISTORY — PX: IR VENOGRAM RENAL UNI LEFT: IMG680

## 2021-12-29 LAB — CBC
HCT: 42.5 % (ref 39.0–52.0)
Hemoglobin: 14.7 g/dL (ref 13.0–17.0)
MCH: 34.3 pg — ABNORMAL HIGH (ref 26.0–34.0)
MCHC: 34.6 g/dL (ref 30.0–36.0)
MCV: 99.1 fL (ref 80.0–100.0)
Platelets: 256 10*3/uL (ref 150–400)
RBC: 4.29 MIL/uL (ref 4.22–5.81)
RDW: 12.7 % (ref 11.5–15.5)
WBC: 6.1 10*3/uL (ref 4.0–10.5)
nRBC: 0 % (ref 0.0–0.2)

## 2021-12-29 LAB — PROTIME-INR
INR: 1 (ref 0.8–1.2)
Prothrombin Time: 12.8 seconds (ref 11.4–15.2)

## 2021-12-29 MED ORDER — FENTANYL CITRATE (PF) 100 MCG/2ML IJ SOLN
INTRAMUSCULAR | Status: AC | PRN
  Administered 2021-12-29: 25 ug via INTRAVENOUS
  Administered 2021-12-29: 50 ug via INTRAVENOUS

## 2021-12-29 MED ORDER — SODIUM CHLORIDE 0.9 % IV SOLN: INTRAVENOUS | Status: DC

## 2021-12-29 MED ORDER — NITROGLYCERIN 1 MG/10 ML FOR IR/CATH LAB
INTRA_ARTERIAL | Status: AC
  Filled 2021-12-29: qty 10

## 2021-12-29 MED ORDER — CEFAZOLIN SODIUM-DEXTROSE 2-4 GM/100ML-% IV SOLN
INTRAVENOUS | Status: AC
  Administered 2021-12-29: 2 g via INTRAVENOUS
  Filled 2021-12-29: qty 100

## 2021-12-29 MED ORDER — HEPARIN SODIUM (PORCINE) 1000 UNIT/ML IJ SOLN
INTRAMUSCULAR | Status: AC | PRN
  Administered 2021-12-29: 2000 [IU] via INTRAVENOUS

## 2021-12-29 MED ORDER — IOHEXOL 300 MG/ML  SOLN
100.0000 mL | Freq: Once | INTRAMUSCULAR | Status: AC | PRN
  Administered 2021-12-29: 60 mL via INTRAVENOUS

## 2021-12-29 MED ORDER — HEPARIN SODIUM (PORCINE) 1000 UNIT/ML IJ SOLN
INTRAMUSCULAR | Status: AC
  Filled 2021-12-29: qty 10

## 2021-12-29 MED ORDER — FENTANYL CITRATE (PF) 100 MCG/2ML IJ SOLN
INTRAMUSCULAR | Status: AC
  Filled 2021-12-29: qty 2

## 2021-12-29 MED ORDER — LIDOCAINE HCL 1 % IJ SOLN
INTRAMUSCULAR | Status: AC
  Filled 2021-12-29: qty 20

## 2021-12-29 MED ORDER — MIDAZOLAM HCL 2 MG/2ML IJ SOLN
INTRAMUSCULAR | Status: AC
  Filled 2021-12-29: qty 2

## 2021-12-29 MED ORDER — LIDOCAINE HCL (PF) 1 % IJ SOLN
INTRAMUSCULAR | Status: AC | PRN
  Administered 2021-12-29: 10 mL

## 2021-12-29 MED ORDER — MIDAZOLAM HCL 2 MG/2ML IJ SOLN
INTRAMUSCULAR | Status: AC | PRN
  Administered 2021-12-29: .5 mg via INTRAVENOUS
  Administered 2021-12-29: 1 mg via INTRAVENOUS

## 2021-12-29 MED ORDER — CEFAZOLIN SODIUM-DEXTROSE 2-4 GM/100ML-% IV SOLN: 2.0000 g | INTRAVENOUS | Status: AC

## 2021-12-29 NOTE — Procedures (Signed)
Interventional Radiology Procedure Note  Procedure: Left renal and testicular venography. Embolization of left testicular vein.  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 Embolization of left testicular vein with embolization coils and Microvascular Plug. Good occlusion achieved.  Jodi Marble. Fredia Sorrow, M.D Pager:  [phone]

## 2021-12-29 NOTE — H&P (Signed)
Chief Complaint: Patient was seen in consultation today for left varicocele embolization  Referring Physician(s): Sherlyn Lees  Supervising Physician: Aletta Edouard  Patient Status: [facility] - Out-pt  History of Present Illness: Ian Munoz is a 41 y.o. male with a past medical history significant for depression, prostatitis and palpable left varicocele who presents today for gonadal venography with possible embolization. Ian Munoz was seen in formal consultation by Dr. Kathlene Cote on 11/17/21 - please see this consult note for full details. Briefly, Ian Munoz has had a known left varicocele for 10-15 years that is associated with chronic left sided scrotal pain which has worsened over the last 1-2 years and is exacerbated by prolonged standing and strenuous activity. On physical exam he was noted to have a palpable 3+ left sided varicocele with increased distention during valsalva as well as left testis with approximately 25% less volume than the right. After review of previous imaging and physical exam findings he was deemed a candidate for gonadal venography with possible embolization of the left varicocele for which he presents today.  Past Medical History:  Diagnosis Date   Depression    Prostate enlargement     Past Surgical History:  Procedure Laterality Date   EYE SURGERY     IR RADIOLOGIST EVAL & MGMT  11/17/2021   WRIST SURGERY Right     Allergies: Wellbutrin [bupropion]  Medications: Prior to Admission medications   Medication Sig Start Date End Date Taking? Authorizing Provider  escitalopram (LEXAPRO) 20 MG tablet Take 20 mg by mouth daily.   Yes [provider]  levothyroxine (SYNTHROID) 50 MCG tablet Take 50 mcg by mouth daily before breakfast. 12/01/21  Yes [provider]  METAMUCIL FIBER PO Take 2 Scoops by mouth daily.   Yes [provider]  Multiple Vitamins-Minerals (MULTIVITAMIN WITH MINERALS) tablet Take 1 tablet by mouth daily.    Yes [provider]     Family History  Family history unknown: Yes    Social History   Socioeconomic History   Marital status: Single    Spouse name: Not on file   Number of children: Not on file   Years of education: Not on file   Highest education level: Not on file  Occupational History   Not on file  Tobacco Use   Smoking status: Never   Smokeless tobacco: Current    Types: Chew  Substance and Sexual Activity   Alcohol use: Yes    Alcohol/week: 5.0 standard drinks of alcohol    Types: 5 Cans of beer per week   Drug use: No   Sexual activity: Yes    Birth control/protection: None  Other Topics Concern   Not on file  Social History Narrative   Not on file   Social Determinants of Health   Financial Resource Strain: Not on file  Food Insecurity: Not on file  Transportation Needs: Not on file  Physical Activity: Not on file  Stress: Not on file  Social Connections: Not on file     Review of Systems: A 12 point ROS discussed and pertinent positives are indicated in the HPI above.  All other systems are negative.  Review of Systems  Constitutional:  Negative for chills and fever.  Respiratory:  Negative for cough and shortness of breath.   Cardiovascular:  Negative for chest pain.  Gastrointestinal:  Negative for abdominal pain, diarrhea, nausea and vomiting.  Genitourinary:  Positive for testicular pain (left sided;chronic).  Musculoskeletal:  Negative for back  pain.  Neurological:  Positive for headaches. Negative for dizziness.    Vital Signs: BP 124/75   Pulse 70   Temp (!) 97.5 F (36.4 C) (Tympanic)   Resp 18   Ht 5\' 2"  (1.575 m)   Wt 160 lb (72.6 kg)   SpO2 97%   BMI 29.26 kg/m   Physical Exam Vitals reviewed.  Constitutional:      General: He is not in acute distress. HENT:     Head: Normocephalic.     Mouth/Throat:     Mouth: Mucous membranes are moist.     Pharynx: Oropharynx is clear. No oropharyngeal exudate or posterior  oropharyngeal erythema.  Cardiovascular:     Rate and Rhythm: Normal rate and regular rhythm.  Pulmonary:     Effort: Pulmonary effort is normal.     Breath sounds: Normal breath sounds.  Abdominal:     General: There is no distension.     Palpations: Abdomen is soft.     Tenderness: There is no abdominal tenderness.  Skin:    General: Skin is warm and dry.  Neurological:     Mental Status: He is alert and oriented to person, place, and time.  Psychiatric:        Mood and Affect: Mood normal.        Behavior: Behavior normal.        Thought Content: Thought content normal.        Judgment: Judgment normal.      MD Evaluation Airway: WNL Heart: WNL Abdomen: WNL Chest/ Lungs: WNL ASA  Classification: 2 Mallampati/Airway Score: Two   Imaging: No results found.  Labs:  CBC: Recent Labs    02/06/21 0130 02/17/21 1028  WBC 6.2 5.0  HGB 16.6 15.5  HCT 49.2 44.9  PLT 264 268    COAGS: No results for input(s): "INR", "APTT" in the last 8760 hours.  BMP: Recent Labs    02/06/21 0130 02/17/21 1028  NA 138 136  K 3.6 3.8  CL 103 103  CO2 26 28  GLUCOSE 119* 106*  BUN 12 11  CALCIUM 8.8* 9.1  CREATININE 1.24 0.87  GFRNONAA >60 >60    LIVER FUNCTION TESTS: Recent Labs    02/06/21 0130 02/17/21 1028  BILITOT 0.4 1.0  AST 23 26  ALT 30 41  ALKPHOS 101 109  PROT 8.5* 7.8  ALBUMIN 4.5 4.5    TUMOR MARKERS: No results for input(s): "AFPTM", "CEA", "CA199", "CHROMGRNA" in the last 8760 hours.  Assessment and Plan:  41 y/o M with history of symptomatic left varicocele who presents today for gonadal venography with possible embolization of the left varicocele.   Risks and benefits of gonadal venography with possible intervention were discussed with the patient including, but not limited to bleeding, infection, vascular injury, contrast induced renal failure, stroke, reperfusion hemorrhage, or even death.  This interventional procedure involves the  use of X-rays and because of the nature of the planned procedure, it is possible that we will have prolonged use of X-ray fluoroscopy. Potential radiation risks to you include (but are not limited to) the following: - A slightly elevated risk for cancer  several years later in life. This risk is typically less than 0.5% percent. This risk is low in comparison to the normal incidence of human cancer, which is 33% for women and 50% for men according to the [employer] Society. - Radiation induced injury can include skin redness, resembling a rash, tissue breakdown / ulcers and  hair loss (which can be temporary or permanent).  The likelihood of either of these occurring depends on the difficulty of the procedure and whether you are sensitive to radiation due to previous procedures, disease, or genetic conditions.  IF your procedure requires a prolonged use of radiation, you will be notified and given written instructions for further action.  It is your responsibility to monitor the irradiated area for the 2 weeks following the procedure and to notify your physician if you are concerned that you have suffered a radiation induced injury.    All of the patient's questions were answered, patient is agreeable to proceed.  Consent signed and in chart.  Thank you for this interesting consult.  I greatly enjoyed meeting Ian Munoz and look forward to participating in Ian Munoz.  A copy of this report was sent to the requesting provider on this date.  Electronically Signed: Villa Herb, PA-C 12/29/2021, 8:27 AM   I spent a total of  30 Minutes  in face to face in clinical consultation, greater than 50% of which was counseling/coordinating Munoz for gonadal venography with possible embolization.

## 2022-01-08 ENCOUNTER — Other Ambulatory Visit: Payer: Self-pay | Admitting: Interventional Radiology

## 2022-01-08 DIAGNOSIS — I861 Scrotal varices: Secondary | ICD-10-CM

## 2022-02-06 ENCOUNTER — Ambulatory Visit
Admission: RE | Admit: 2022-02-06 | Discharge: 2022-02-06 | Disposition: A | Discharge status: Home / Self Care | Payer: BLUE CROSS/BLUE SHIELD | Source: Ambulatory Visit | Attending: Interventional Radiology | Admitting: Interventional Radiology

## 2022-02-06 ENCOUNTER — Encounter: Payer: Self-pay | Admitting: *Deleted

## 2022-02-06 DIAGNOSIS — I861 Scrotal varices: Secondary | ICD-10-CM

## 2022-02-06 HISTORY — PX: IR RADIOLOGIST EVAL & MGMT: IMG5224

## 2022-02-06 NOTE — Progress Notes (Signed)
Chief Complaint: Patient was consulted remotely today ([place]) for follow-up after left varicocele repair.  History of Present Illness: Ian Munoz is a 41 y.o. male status post left varicocele repair consisting of embolization coil and vascular plug placement in the left testicular vein on 12/29/2021.  He noticed significant diminished distention of the left varicocele the day after the procedure and shortly thereafter also noticed significant diminishment and resolution of pain associated with the varicocele.  He states that the chronic and daily left-sided scrotal pain that he had been experiencing has now completely resolved.  He is back to work and has not had any recurrence of pain with prolonged standing or strenuous activity.  He has noticed some rare periodic twinges of discomfort in the left inguinal region which are brief and resolve quickly.  He denies fever.  Urinary habits have been normal.  Past Medical History:  Diagnosis Date   Depression    Prostate enlargement     Past Surgical History:  Procedure Laterality Date   EYE SURGERY     IR ANGIOGRAM FOLLOW UP STUDY  12/29/2021   IR ANGIOGRAM SELECTIVE EACH ADDITIONAL VESSEL  12/29/2021   IR EMBO VENOUS NOT HEMORR HEMANG  INC GUIDE ROADMAPPING  12/29/2021   IR RADIOLOGIST EVAL & MGMT  11/17/2021   IR US GUIDE VASC ACCESS RIGHT  12/29/2021   IR VENOGRAM RENAL UNI LEFT  12/29/2021   WRIST SURGERY Right     Allergies: Wellbutrin [bupropion]  Medications: Prior to Admission medications   Medication Sig Start Date End Date Taking? Authorizing Provider  escitalopram (LEXAPRO) 20 MG tablet Take 20 mg by mouth daily.    [provider]  levothyroxine (SYNTHROID) 50 MCG tablet Take 50 mcg by mouth daily before breakfast. 12/01/21   [provider]  METAMUCIL FIBER PO Take 2 Scoops by mouth daily.    [provider]  Multiple Vitamins-Minerals (MULTIVITAMIN WITH MINERALS) tablet Take 1 tablet by  mouth daily.    [provider]     Family History  Family history unknown: Yes    Social History   Socioeconomic History   Marital status: Single    Spouse name: Not on file   Number of children: Not on file   Years of education: Not on file   Highest education level: Not on file  Occupational History   Not on file  Tobacco Use   Smoking status: Never   Smokeless tobacco: Current    Types: Chew  Substance and Sexual Activity   Alcohol use: Yes    Alcohol/week: 5.0 standard drinks of alcohol    Types: 5 Cans of beer per week   Drug use: No   Sexual activity: Yes    Birth control/protection: None  Other Topics Concern   Not on file  Social History Narrative   Not on file   Social Determinants of Health   Financial Resource Strain: Not on file  Food Insecurity: Not on file  Transportation Needs: Not on file  Physical Activity: Not on file  Stress: Not on file  Social Connections: Not on file    Review of Systems  Constitutional: Negative.   Respiratory: Negative.    Cardiovascular: Negative.   Gastrointestinal: Negative.   Genitourinary: Negative.   Musculoskeletal: Negative.   Neurological: Negative.     Review of Systems: A 12 point ROS discussed and pertinent positives are indicated in the HPI above.  All other systems are negative.    Physical  Exam No direct physical exam was performed (except for noted visual exam findings with Video Visits).   Vital Signs: There were no vitals taken for this visit.  Imaging: No results found.  Labs:  CBC: Recent Labs    02/17/21 1028 12/29/21 0910  WBC 5.0 6.1  HGB 15.5 14.7  HCT 44.9 42.5  PLT 268 256    COAGS: Recent Labs    12/29/21 0910  INR 1.0    BMP: Recent Labs    02/17/21 1028  NA 136  K 3.8  CL 103  CO2 28  GLUCOSE 106*  BUN 11  CALCIUM 9.1  CREATININE 0.87  GFRNONAA >60    LIVER FUNCTION TESTS: Recent Labs    02/17/21 1028  BILITOT 1.0  AST 26  ALT 41   ALKPHOS 109  PROT 7.8  ALBUMIN 4.5    Assessment and Plan:  [name] has done very well after embolization of a symptomatic left varicocele.  His symptoms of chronic everyday discomfort related to the left varicocele have completely resolved and he is no longer experiencing any chronic pain.  He has had a few brief episodes of discomfort higher up in the left inguinal region after the procedure which may be attributable to some residual inflammation related to the location of embolization coils and/or resolving thrombophlebitis.  I told him that I would anticipate this will completely resolve but to let me know if any pain symptoms worsen or if he develops a fever.    Electronically Signed: Reola Calkins 02/06/2022, 2:21 PM    I spent a total of 10 Minutes in remote  clinical consultation, greater than 50% of which was counseling/coordinating care status post varicocele embolization.    Visit type: Audio only (telephone). Audio (no video) only due to patient's lack of internet/smartphone capability. Alternative for in-person consultation at [facility], [address] [place], [place], [place]. This visit type was conducted due to national recommendations for restrictions regarding the COVID-19 Pandemic (e.g. social distancing).  This format is felt to be most appropriate for this patient at this time.  All issues noted in this document were discussed and addressed.

## 2022-04-28 IMAGING — CR DG NASAL BONES 3+V
3 series · 3 of 3 positions shown · non-contrast
Comparison: Head CT 08/03/2017

CLINICAL DATA: Fall striking nose on table.  Pain along the nose.

EXAM:
NASAL BONES - 3+ VIEW

[nasal waters]
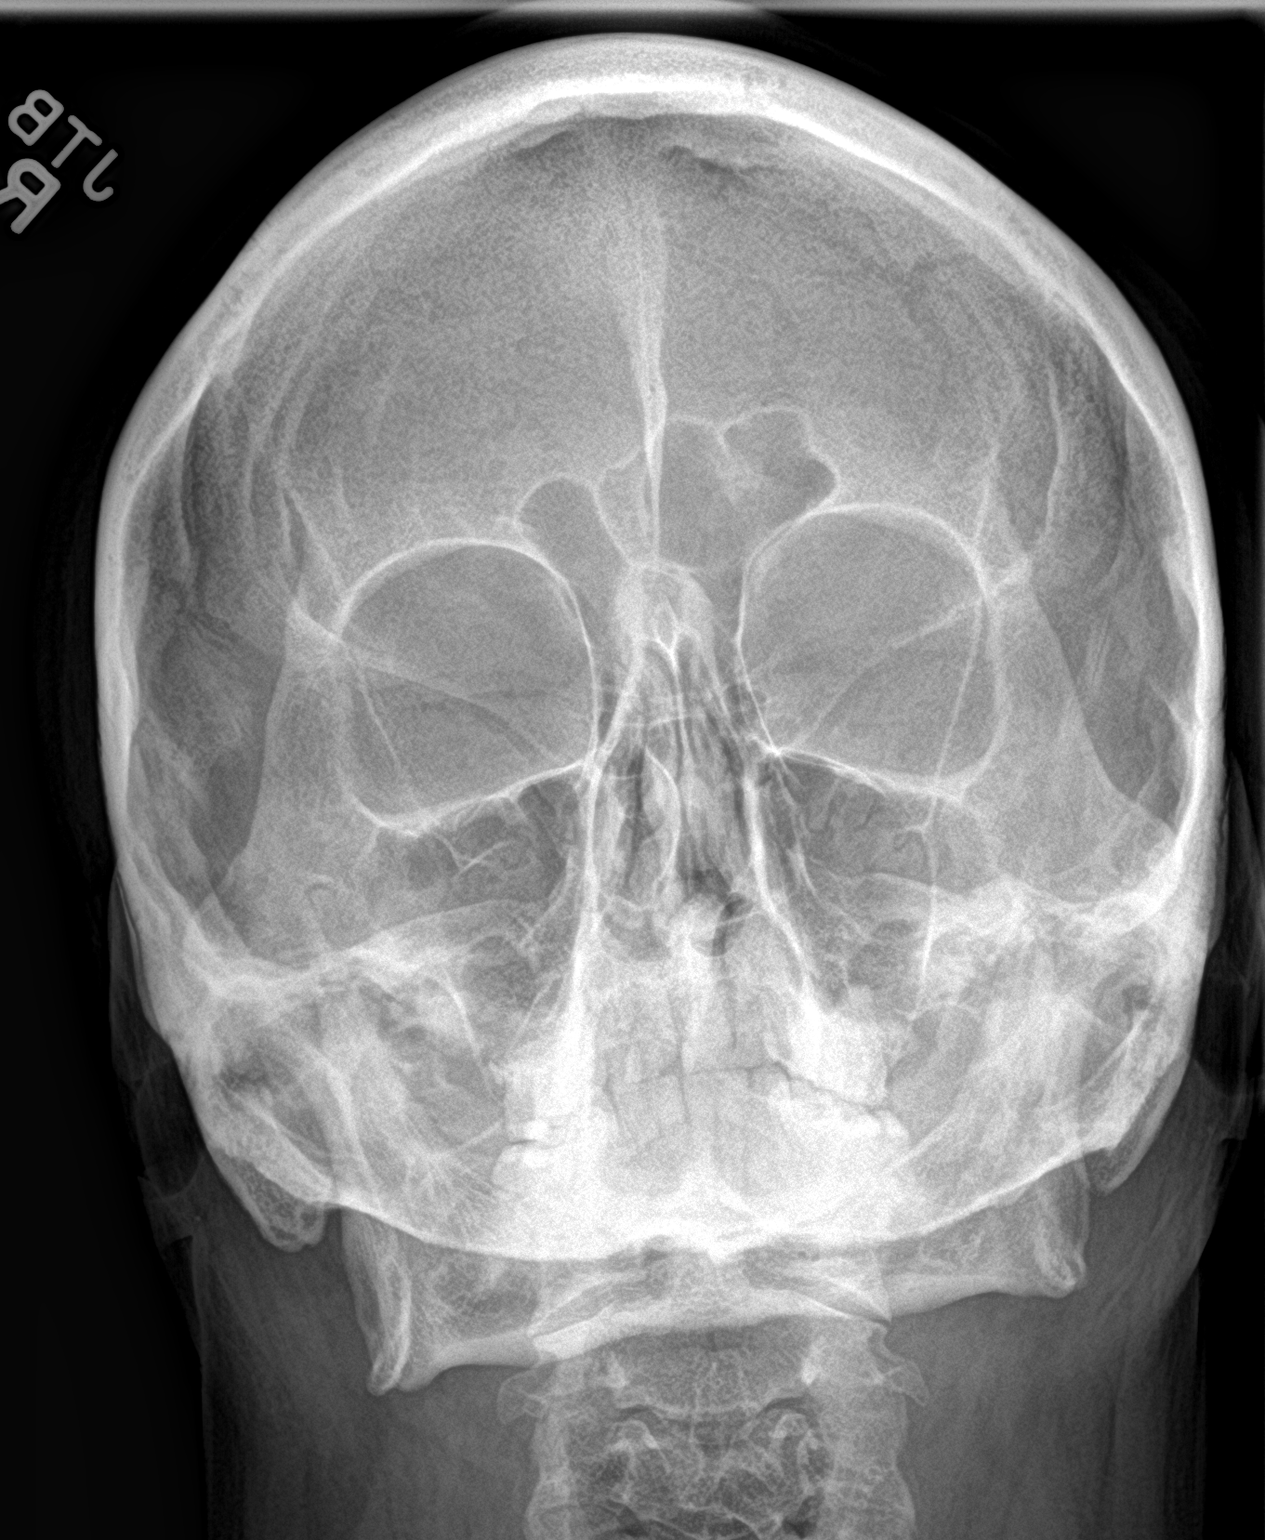

[nasal lat (1 of 2)]
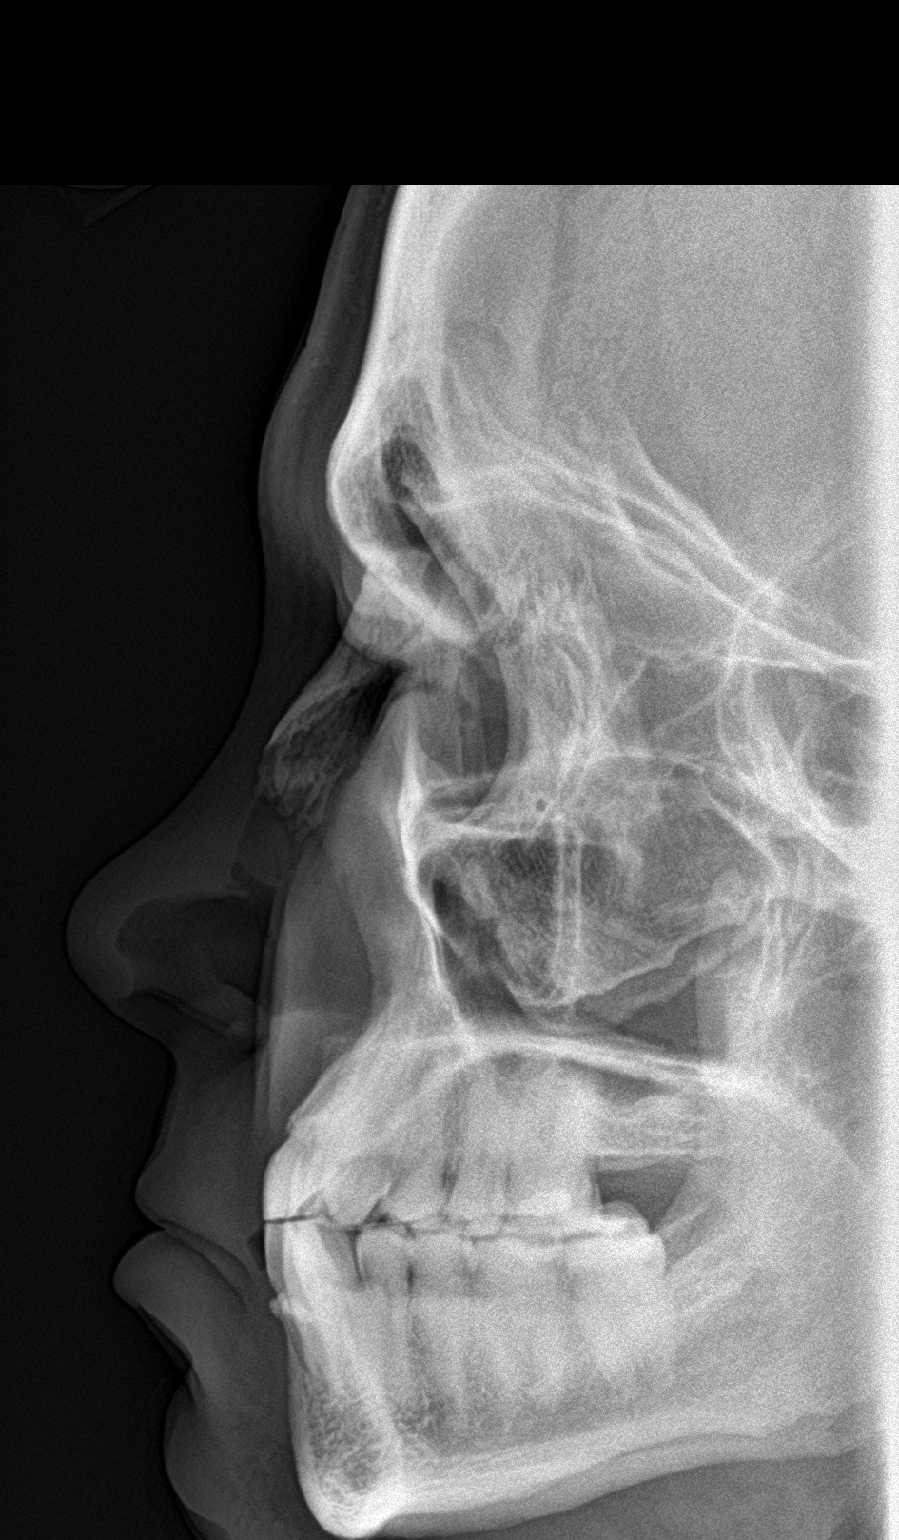

[nasal lat (2 of 2)]
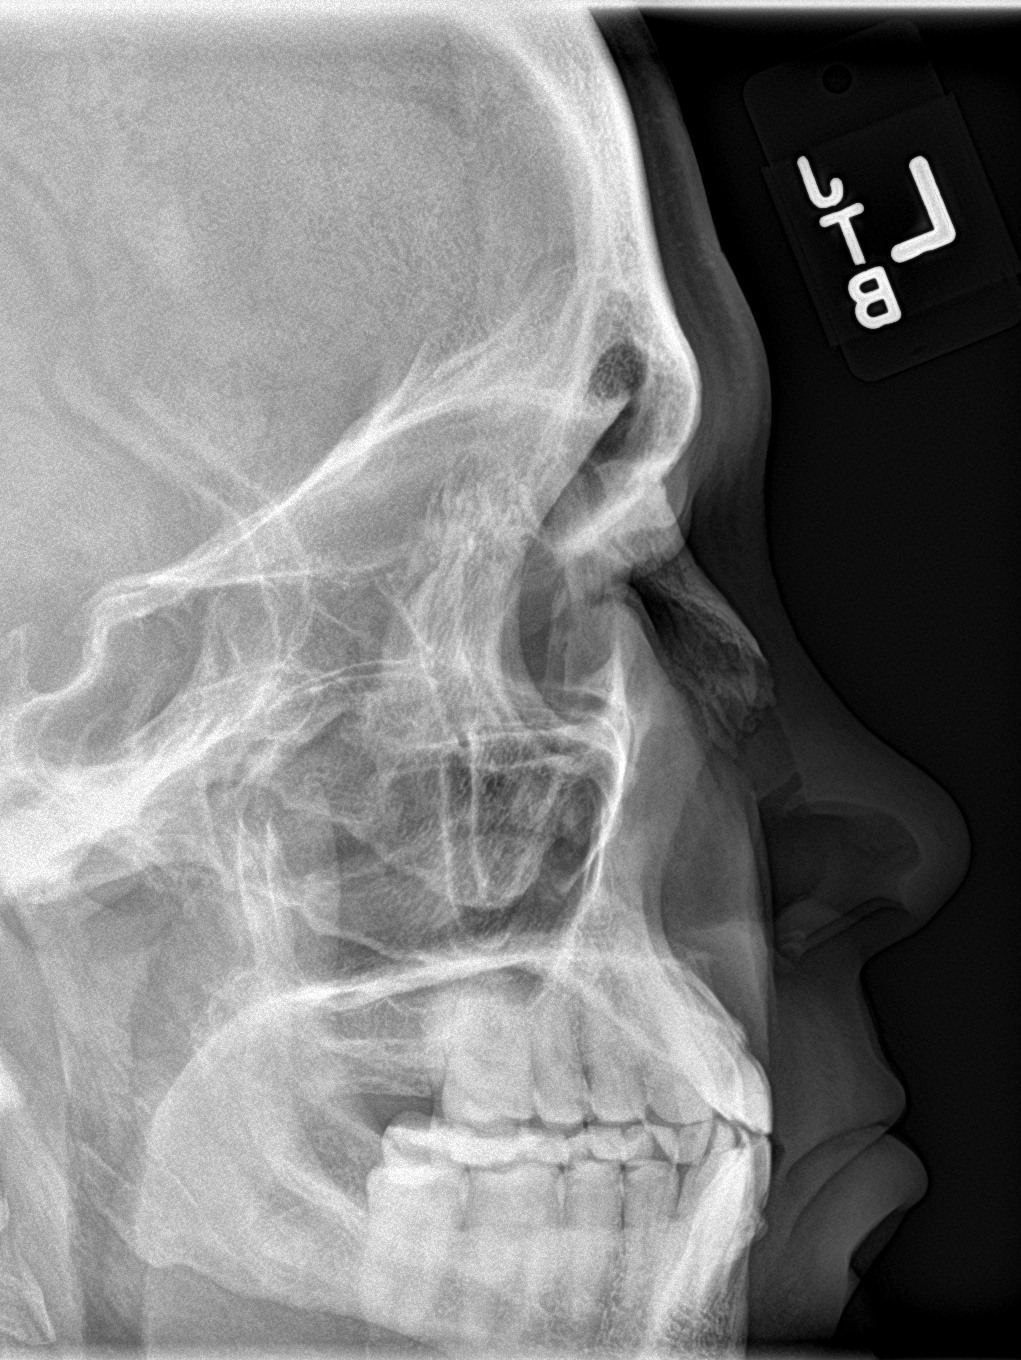

[3 of 3 positions shown; findings below may reference images not displayed]

FINDINGS: No fracture is identified. No asymmetric sinus opacification is
appreciated.
IMPRESSION: 1. No fracture identified.

## 2022-06-30 ENCOUNTER — Other Ambulatory Visit: Payer: Self-pay

## 2022-06-30 ENCOUNTER — Encounter (HOSPITAL_COMMUNITY): Payer: Self-pay

## 2022-06-30 ENCOUNTER — Emergency Department (HOSPITAL_COMMUNITY): Payer: 59

## 2022-06-30 ENCOUNTER — Emergency Department (HOSPITAL_COMMUNITY)
Admission: EM | Admit: 2022-06-30 | Discharge: 2022-06-30 | Disposition: A | Discharge status: Home / Self Care | Payer: 59 | Attending: Emergency Medicine | Admitting: Emergency Medicine

## 2022-06-30 DIAGNOSIS — J0100 Acute maxillary sinusitis, unspecified: Secondary | ICD-10-CM | POA: Diagnosis not present

## 2022-06-30 DIAGNOSIS — R059 Cough, unspecified: Secondary | ICD-10-CM | POA: Diagnosis present

## 2022-06-30 DIAGNOSIS — Z1152 Encounter for screening for COVID-19: Secondary | ICD-10-CM | POA: Insufficient documentation

## 2022-06-30 LAB — RESP PANEL BY RT-PCR (RSV, FLU A&B, COVID)  RVPGX2
Influenza A by PCR: NEGATIVE
Influenza B by PCR: NEGATIVE
Resp Syncytial Virus by PCR: NEGATIVE
SARS Coronavirus 2 by RT PCR: NEGATIVE

## 2022-06-30 LAB — GROUP A STREP BY PCR: Group A Strep by PCR: NOT DETECTED

## 2022-06-30 MED ORDER — IPRATROPIUM-ALBUTEROL 0.5-2.5 (3) MG/3ML IN SOLN
3.0000 mL | Freq: Once | RESPIRATORY_TRACT | Status: AC
  Administered 2022-06-30: 3 mL via RESPIRATORY_TRACT
  Filled 2022-06-30: qty 3

## 2022-06-30 MED ORDER — KETOROLAC TROMETHAMINE 15 MG/ML IJ SOLN
15.0000 mg | Freq: Once | INTRAMUSCULAR | Status: AC
  Administered 2022-06-30: 15 mg via INTRAMUSCULAR
  Filled 2022-06-30: qty 1

## 2022-06-30 MED ORDER — AMOXICILLIN-POT CLAVULANATE 875-125 MG PO TABS: 1.0000 | ORAL_TABLET | Freq: Two times a day (BID) | ORAL | 0 refills | Status: AC

## 2022-06-30 MED ORDER — METOCLOPRAMIDE HCL 10 MG PO TABS
10.0000 mg | ORAL_TABLET | Freq: Once | ORAL | Status: AC
  Administered 2022-06-30: 10 mg via ORAL
  Filled 2022-06-30: qty 1

## 2022-06-30 MED ORDER — AMOXICILLIN-POT CLAVULANATE 875-125 MG PO TABS: 1.0000 | ORAL_TABLET | Freq: Two times a day (BID) | ORAL | 0 refills | Status: DC

## 2022-06-30 MED ORDER — DEXAMETHASONE 4 MG PO TABS
6.0000 mg | ORAL_TABLET | Freq: Once | ORAL | Status: AC
  Administered 2022-06-30: 6 mg via ORAL
  Filled 2022-06-30: qty 1

## 2022-06-30 NOTE — ED Notes (Signed)
Gave EKG To MD [facility]

## 2022-06-30 NOTE — Discharge Instructions (Addendum)
Thank you for letting us take care of you today.  Your swabs for COVID, RSV, flu, and strep are negative.  The chest x-ray did not show any pneumonia or other abnormalities.  Your EKG was also normal.  As discussed, with your prolonged symptoms I will go ahead and treat you with antibiotics for a sinus infection.  I prescribed Augmentin.  Please take this as prescribed.  I  have also provided information for sinus rinses which may help with your symptoms.  You may take over-the-counter Tylenol and ibuprofen as needed for pain, discomfort, and fever.  You may take up to 1000 mg Tylenol and 600 mg ibuprofen every 6 hours.  Do not take more than 4000 mg Tylenol or 3200 mg ibuprofen in 24 hours.  If you continue to have symptoms over the next week, I recommend that you follow-up with your primary care provider.  If you do not have a PCP, I provided the information for 2 clinics that you may contact to schedule a follow-up appointment or to establish primary care to control any chronic conditions.  For any new or worsening symptoms, please return to nearest emergency department for re-evaluation.

## 2022-06-30 NOTE — ED Triage Notes (Signed)
C/o congestion, itchy/watery eyes, headache, body aches, and productive cough x1 week  OTC meds w/o relief.

## 2022-06-30 NOTE — ED Provider Notes (Signed)
[facility] Provider Note   CSN: [id number] Arrival date & time: 06/30/22  [id number]     History  Chief Complaint  Patient presents with   Cough    Ian Munoz is a 42 y.o. male who presents to the ED can waning of generalized headache, facial pain, cough, congestion, and eye watering for the last 10 days.  He started developing generalized body aches and weakness a few days ago.  He reports that today he has had sharp chest pain diffusely across the anterior chest only when he coughs.  No other chest pain or associated symptoms such as lightheadedness, diaphoresis, dizziness, syncope, palpitations, or radiating pain.  No known sick contacts.  He has been attempting to treat symptoms with over-the-counter medications without any relief.  He denies fever, chills, shortness of breath, abdominal pain, neck pain, nausea, vomiting, or diarrhea.  He denies tobacco use.  States he has a history of recurrent sinusitis with his last reported infection being in December 2023 which he treated with over-the-counter medications at home.  No antibiotics in the last few months.      Home Medications States currently not taking any prescription medication  Allergies    Bupropion    Review of Systems   Review of Systems  All other systems reviewed and are negative.   Physical Exam Updated Vital Signs BP (!) 129/95 (BP Location: Right Arm)   Pulse 97   Temp 98.1 F (36.7 C) (Oral)   Resp 16   Wt 72 kg   SpO2 100%   BMI 29.03 kg/m  Physical Exam Vitals and nursing note reviewed.  Constitutional:      General: He is not in acute distress.    Appearance: Normal appearance. He is not ill-appearing, toxic-appearing or diaphoretic.  HENT:     Head: Normocephalic and atraumatic. No right periorbital erythema or left periorbital erythema.     Nose: Congestion present.     Right Sinus: Maxillary sinus tenderness present. No frontal sinus tenderness.      Left Sinus: Maxillary sinus tenderness present. No frontal sinus tenderness.     Comments: No skin changes overlying sinuses    Mouth/Throat:     Mouth: Mucous membranes are moist.     Pharynx: Oropharynx is clear. No oropharyngeal exudate or posterior oropharyngeal erythema.  Eyes:     General:        Right eye: Discharge (mild watery) present.        Left eye: Discharge (mild watery) present.    Conjunctiva/sclera: Conjunctivae normal.  Neck:     Comments: No meningismus Cardiovascular:     Rate and Rhythm: Normal rate and regular rhythm.     Heart sounds: Normal heart sounds. No murmur heard. Pulmonary:     Effort: Pulmonary effort is normal. No respiratory distress.     Breath sounds: No stridor. Wheezing (mild expiratory diffusely worse on the left) present. No rhonchi or rales.  Chest:     Chest wall: Tenderness (mild diffuse anterior) present.  Abdominal:     General: Abdomen is flat. There is no distension.     Palpations: Abdomen is soft.     Tenderness: There is no abdominal tenderness. There is no guarding or rebound.  Musculoskeletal:        General: Normal range of motion.     Cervical back: Normal range of motion and neck supple. No rigidity.     Right lower leg: No edema.  Left lower leg: No edema.  Skin:    General: Skin is warm and dry.     Capillary Refill: Capillary refill takes less than 2 seconds.     Findings: No rash.  Neurological:     General: No focal deficit present.     Mental Status: He is alert and oriented to person, place, and time.     GCS: GCS eye subscore is 4. GCS verbal subscore is 5. GCS motor subscore is 6.     Cranial Nerves: Cranial nerves 2-12 are intact. No cranial nerve deficit, dysarthria or facial asymmetry.     Sensory: Sensation is intact.     Motor: Motor function is intact. No weakness, atrophy or abnormal muscle tone.     Coordination: Coordination is intact.     Gait: Gait is intact.  Psychiatric:        Behavior:  Behavior normal.     ED Results / Procedures / Treatments   Labs (all labs ordered are listed, but only abnormal results are displayed) Labs Reviewed  RESP PANEL BY RT-PCR (RSV, FLU A&B, COVID)  [id number]  GROUP A STREP BY PCR    EKG NSR at 86 bpm, no acute ST-T changes, normal EKG  Radiology DG Chest 2 View  Result Date: 06/30/2022 CLINICAL DATA:  Cough, congestion, itchy watery eyes, headache, body aches and productive cough for 1 week EXAM: CHEST - 2 VIEW COMPARISON:  08/06/2020 FINDINGS: Normal heart size, mediastinal contours, and pulmonary vascularity. Lungs clear. No pulmonary infiltrate, pleural effusion, or pneumothorax. Mild upper thoracic scoliosis. IMPRESSION: No acute abnormalities. Electronically Signed   By: Lavonia Dana M.D.   On: 06/30/2022 10:22    Procedures Procedures    Medications Ordered in ED Medications  ketorolac (TORADOL) 15 MG/ML injection 15 mg (has no administration in time range)  metoCLOPramide (REGLAN) tablet 10 mg (has no administration in time range)  dexamethasone (DECADRON) tablet 6 mg (has no administration in time range)  ipratropium-albuterol (DUONEB) 0.5-2.5 (3) MG/3ML nebulizer solution 3 mL (3 mLs Nebulization Given 06/30/22 1041)    ED Course/ Medical Decision Making/ A&P                             Medical Decision Making Amount and/or Complexity of Data Reviewed Labs: ordered. Decision-making details documented in ED Course. Radiology: ordered. Decision-making details documented in ED Course.  Risk Prescription drug management.   Medical Decision Making:   Ian Munoz is a 42 y.o. male who presented to the ED today with URI symptoms detailed above.    Patient's presentation is complicated by their history of recurrent sinusitis.  Complete initial physical exam performed, notably the patient  was in no acute distress, normal heart rate, mild expiratory wheezing on lung exam, tenderness over the bilateral maxillary sinuses  without overlying skin changes, no meningismus.  Normal oropharyngeal exam.  Normal neurological exam with equal 5/5 strength to bilateral upper and lower extremities, cranial nerves intact, normal gait, normal speech.    Reviewed and confirmed nursing documentation for past medical history, family history, social history.    Initial Assessment:   With the patient's presentation of URI symptoms, most likely diagnosis is acute bacterial sinusitis. Differential diagnosis includes but is not limited to COVID-19, RSV, influenza, unspecified viral syndrome, pneumonia, seasonal allergies, GERD, pleurisy.  This is most consistent with an acute complicated illness  Initial Plan:  Viral swabs  Strep swab  CXR to  evaluate for structural/infectious intrathoracic pathology.  Medication management for headache as above  Objective evaluation as reviewed   Initial Study Results:   Laboratory  All laboratory results reviewed without evidence of clinically relevant pathology.    EKG EKG was reviewed independently. ST segments without concerns for elevations.   EKG: NSR at 86 bpm, no acute ST-T changes, normal EKG.   Radiology:  All images reviewed independently. Agree with radiology report at this time.   DG Chest 2 View  Result Date: 06/30/2022 CLINICAL DATA:  Cough, congestion, itchy watery eyes, headache, body aches and productive cough for 1 week EXAM: CHEST - 2 VIEW COMPARISON:  08/06/2020 FINDINGS: Normal heart size, mediastinal contours, and pulmonary vascularity. Lungs clear. No pulmonary infiltrate, pleural effusion, or pneumothorax. Mild upper thoracic scoliosis. IMPRESSION: No acute abnormalities. Electronically Signed   By: Lavonia Dana M.D.   On: 06/30/2022 10:22     Final Assessment and Plan:   This is a 42 year old male presenting to ED with URI symptoms as above now going on for at least 10 days. He reports generalized headache, facial pain, cough, congestion, and generalized body aches.  Vital signs stable. He has bilateral maxillary sinus tenderness and nasal congestion on exam. Oropharyngeal exam is unremarkable. Mild expiratory wheezing in lungs but no rhonchi, rales, and no respiratory distress. He notes pleuritic chest pain with tenderness across chest wall. EKG unremarkable. Swabs and XR also unremarkable. Suspect acute bacterial sinusitis. Will treat with 10 day course of Augmentin. Discussed all findings with pt, treatment plan, strict ED return precautions. All questions answered and pt stable for discharge.     Clinical Impression:  1. Acute maxillary sinusitis, recurrence not specified      Discharge           Final Clinical Impression(s) / ED Diagnoses Final diagnoses:  Acute maxillary sinusitis, recurrence not specified    Rx / DC Orders ED Discharge Orders          Ordered    amoxicillin-clavulanate (AUGMENTIN) 875-125 MG tablet  Every 12 hours        06/30/22 1052              Turner Daniels 06/30/22 1117    Isla Pence, MD 06/30/22 1124

## 2022-07-13 ENCOUNTER — Emergency Department (HOSPITAL_COMMUNITY)
Admission: EM | Admit: 2022-07-13 | Discharge: 2022-07-13 | Disposition: A | Discharge status: Home / Self Care | Payer: 59 | Attending: Emergency Medicine | Admitting: Emergency Medicine

## 2022-07-13 ENCOUNTER — Emergency Department (HOSPITAL_COMMUNITY): Payer: 59

## 2022-07-13 ENCOUNTER — Other Ambulatory Visit: Payer: Self-pay

## 2022-07-13 DIAGNOSIS — M25561 Pain in right knee: Secondary | ICD-10-CM | POA: Insufficient documentation

## 2022-07-13 MED ORDER — ACETAMINOPHEN 325 MG PO TABS: 650.0000 mg | ORAL_TABLET | Freq: Once | ORAL | Status: DC

## 2022-07-13 MED ORDER — OXYCODONE-ACETAMINOPHEN 5-325 MG PO TABS
1.0000 | ORAL_TABLET | Freq: Once | ORAL | Status: AC
  Administered 2022-07-13: 1 via ORAL
  Filled 2022-07-13: qty 1

## 2022-07-13 NOTE — Discharge Instructions (Addendum)
Please use Tylenol or ibuprofen for pain.  You may use 600 mg ibuprofen every 6 hours or 1000 mg of Tylenol every 6 hours.  You may choose to alternate between the 2.  This would be most effective.  Not to exceed 4 g of Tylenol within 24 hours.  Not to exceed 3200 mg ibuprofen 24 hours.  Patient received percocet x 1 in the emergency department for acute pain. I recommend Rest, Ice, compression, and elevation of the affected extremity. Follow up with orthopedics if pain not significantly improved after 1-2 weeks

## 2022-07-13 NOTE — ED Triage Notes (Signed)
C/o right knee pain.  Denies recent injury.  No swelling or deformity noted in triage.  Rest, tylenol, elevation w/o relief.
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
## 2023-03-25 ENCOUNTER — Telehealth: Payer: Self-pay | Admitting: Neurology

## 2023-03-25 ENCOUNTER — Encounter: Payer: Self-pay | Admitting: Neurology

## 2023-03-25 ENCOUNTER — Ambulatory Visit (INDEPENDENT_AMBULATORY_CARE_PROVIDER_SITE_OTHER): Payer: 59 | Admitting: Neurology

## 2023-03-25 VITALS — BP 123/77 | HR 77 | Ht 62.0 in | Wt 168.8 lb

## 2023-03-25 DIAGNOSIS — F1590 Other stimulant use, unspecified, uncomplicated: Secondary | ICD-10-CM | POA: Diagnosis not present

## 2023-03-25 DIAGNOSIS — F1021 Alcohol dependence, in remission: Secondary | ICD-10-CM | POA: Diagnosis not present

## 2023-03-25 DIAGNOSIS — G4719 Other hypersomnia: Secondary | ICD-10-CM

## 2023-03-25 DIAGNOSIS — G478 Other sleep disorders: Secondary | ICD-10-CM

## 2023-03-25 NOTE — Progress Notes (Addendum)
SLEEP MEDICINE CLINIC    Provider:  Melvyn Novas, MD  Primary Care Physician:  Doylene Canning, FNP [address] [place] [address]     Referring Provider: [facility]  [employer]. Doylene Canning, Fnp [address] [place],  [place] [address]          Chief Complaint according to patient   Patient presents with:     New Patient (Initial Visit)           HISTORY OF PRESENT ILLNESS:  Ian Munoz is a 42 y.o. male patient who is seen upon referral on 03/25/2023 from [facility] for a sleep consultation,  based on his  Chief concern of : non restorative sleep, sleepiness in daytime, and it doesn't matter how long he sleeps" testosterone check up was normal. Hypothyroidism was found (  was known before ) and restarted on :Thyroid medications. He was h financially strained and could only afford them starting 1 week ago.  He drinks now a lot of energy drinks and caffeine : the patient is a recovering polysubstance abuser, alcohol, methamphetamine, cocaine., sober after 20 years of use 2 years ago, still using nicotine."     I have the pleasure of seeing [name] 03/25/23 a right-handed male with a possible substance induced sleep disorder.  The patient had no previous sleep study .    Sleep relevant medical history:  no dreams since he became sober,  waking up alert in the middle of the night but the fatigue will soon take over again.  Nocturia 0-1, no Sleep walking, but sleep talking.  No ENT/ Tonsillectomy, cervical spine injury  /surgery/ one concussion.  No braces or retainers.  Back problems in the last   Family medical /sleep history: No  other family member on CPAP with OSA,  one sister with substance abuse. Both parents were alcoholics.  Grew up in [place].  From [place].   Social history:  he is not driving - he can't afford nicotine patches. Patient is working in [employer] ( late nights ) and  works in traffic  control) and lives in a household / group home. Family status is divorced , with 1 child, 56 years of age.   The patient currently works/ used to work in shifts( night/ rotating,) Tobacco use; yes.  ETOH use ; quit , high Caffeine intake in form of Coffee( 4-6) and energy drinks.   Sleep habits are as follows: The patient's dinner time is between after 6 PM. The patient goes to bed at 9-11 PM and continues to sleep for variable  hours( can be  5 -or 10 hours)  , wakes for -1 bathroom breaks. Bedroom is quiet and dark.   The preferred sleep position is prone or laterally , with the support of 1 pillow. Dreams are reportedly rare.   The patient wakes up spontaneously at 8 AM but needs an alarm for work. 6.30 AM is the usual rise time. He reports not feeling refreshed or restored in AM, with symptoms such as dry mouth, morning headaches, and residual fatigue.  Naps are taken frequently, not scheduled- when ever not stimulated and not active lasting from 60 minutes onward  to up to 6 hours and are less refreshing than nocturnal sleep.    Review of Systems: Out of a complete 14 system review, the patient complains of only the following symptoms, and all  other reviewed systems are negative.:  Fatigue, sleepiness , snoring, fragmented sleep, Insomnia.   How likely are you to doze in the following situations: 0 = not likely, 1 = slight chance, 2 = moderate chance, 3 = high chance   Sitting and Reading? Watching Television? Sitting inactive in a public place (theater or meeting)? As a passenger in a car for an hour without a break? Lying down in the afternoon when circumstances permit? Sitting and talking to someone? Sitting quietly after lunch without alcohol? In a car, while stopped for a few minutes in traffic?   Total = 17/ 24 points   FSS endorsed at 55/ 63 points.  Depression : denies.     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  HOMELESS, JOBLESS, BROKE, DIVORCED.     Socioeconomic History   Marital status: Single    Spouse name: Not on file   Number of children: Not on file   Years of education: Not on file   Highest education level: Not on file  Occupational History   Not on file  Tobacco Use   Smoking status: Never   Smokeless tobacco: Current    Types: Chew  Substance and Sexual Activity   Alcohol use: Yes    Sober sicne 2023      POLYSUBSTANCE ABUSER until 2023     Drug use: No   Sexual activity: Yes    Birth control/protection: None  Other Topics Concern   Not on file  Social History Narrative   Not on file   Social Determinants of Health   Financial Resource Strain: Not on file  Food Insecurity: Medium Risk (03/16/2023)   Received from [facility]   Hunger Vital Sign    Worried About Running Out of Food in the Last Year: Sometimes true    Ran Out of Food in the Last Year: Sometimes true  Transportation Needs: Unmet Transportation Needs (03/16/2023)   Received from [facility]    In the past 12 months, has lack of reliable transportation kept you from medical appointments, meetings, work or from getting things needed for daily living? : Yes  Physical Activity: Not on file  Stress: Not on file  Social Connections: Not on file    Family History  Family history unknown: Yes    Past Medical History:  Diagnosis Date   Depression    Prostate enlargement     Past Surgical History:  Procedure Laterality Date   EYE SURGERY     IR ANGIOGRAM FOLLOW UP STUDY  12/29/2021   IR ANGIOGRAM SELECTIVE EACH ADDITIONAL VESSEL  12/29/2021   IR EMBO VENOUS NOT HEMORR HEMANG  INC GUIDE ROADMAPPING  12/29/2021   IR RADIOLOGIST EVAL & MGMT  11/17/2021   IR RADIOLOGIST EVAL & MGMT  02/06/2022   IR US GUIDE VASC ACCESS RIGHT  12/29/2021   IR VENOGRAM RENAL UNI LEFT  12/29/2021   WRIST SURGERY Right      Current  Outpatient Medications on File Prior to Visit  Medication Sig Dispense Refill   levothyroxine (SYNTHROID) 50 MCG tablet Take 50 mcg by mouth daily before breakfast.     No current facility-administered medications on file prior to visit.    Allergies  Allergen Reactions   Bupropion Nausea And Vomiting    Constipation     DIAGNOSTIC DATA (LABS, IMAGING, TESTING) - I reviewed patient records, labs, notes, testing and imaging myself  where available.  Lab Results  Component Value Date   WBC 6.1 12/29/2021   HGB 14.7 12/29/2021   HCT 42.5 12/29/2021   MCV 99.1 12/29/2021   PLT 256 12/29/2021      Component Value Date/Time   NA 136 02/17/2021 1028   NA 136 09/23/2020 1024   NA 138 05/15/2014 1210   K 3.8 02/17/2021 1028   K 4.2 05/15/2014 1210   CL 103 02/17/2021 1028   CL 100 05/15/2014 1210   CO2 28 02/17/2021 1028   CO2 30 05/15/2014 1210   GLUCOSE 106 (H) 02/17/2021 1028   GLUCOSE 99 05/15/2014 1210   BUN 11 02/17/2021 1028   BUN 10 09/23/2020 1024   BUN 11 05/15/2014 1210   CREATININE 0.87 02/17/2021 1028   CREATININE 0.96 05/15/2014 1210   CALCIUM 9.1 02/17/2021 1028   CALCIUM 9.3 05/15/2014 1210   PROT 7.8 02/17/2021 1028   PROT 7.4 09/23/2020 1024   PROT 8.6 (H) 08/28/2013 2102   ALBUMIN 4.5 02/17/2021 1028   ALBUMIN 4.6 09/23/2020 1024   ALBUMIN 4.3 08/28/2013 2102   AST 26 02/17/2021 1028   AST 37 08/28/2013 2102   ALT 41 02/17/2021 1028   ALT 57 08/28/2013 2102   ALKPHOS 109 02/17/2021 1028   ALKPHOS 130 (H) 08/28/2013 2102   BILITOT 1.0 02/17/2021 1028   BILITOT 0.4 09/23/2020 1024   BILITOT 0.3 08/28/2013 2102   GFRNONAA >60 02/17/2021 1028   GFRNONAA >60 05/15/2014 1210   GFRNONAA >60 08/28/2013 2102   GFRAA >60 08/06/2019 0032   GFRAA >60 05/15/2014 1210   GFRAA >60 08/28/2013 2102   Lab Results  Component Value Date   CHOL 260 (H) 09/23/2020   HDL 83 09/23/2020   LDLCALC 137 (H) 09/23/2020   TRIG 226 (H) 09/23/2020   CHOLHDL 3.1  09/23/2020   No results found for: "HGBA1C" No results found for: "VITAMINB12"  Testosterone, ?  Thiamine : ? Lab Results  Component Value Date   TSH 1.680 09/23/2020    PHYSICAL EXAM:  Today's Vitals   03/25/23 1110  BP: 123/77  Pulse: 77  Weight: 168 lb 12.8 oz (76.6 kg)  Height: 5\' 2"  (1.575 m)   Body mass index is 30.87 kg/m.   Wt Readings from Last 3 Encounters:  03/25/23 168 lb 12.8 oz (76.6 kg)  06/30/22 158 lb 11.7 oz (72 kg)  12/29/21 160 lb (72.6 kg)     Ht Readings from Last 3 Encounters:  03/25/23 5\' 2"  (1.575 m)  12/29/21 5\' 2"  (1.575 m)  02/06/21 5\' 5"  (1.651 m)      General: The patient is awake, alert and appears not in acute distress. The patient is well groomed. Head: Normocephalic, atraumatic. Neck is supple.  No filum on the top of the lip,  slanted eyes, small lower jaw,  Mallampati 3,   neck circumference:16 inches . Nasal airflow almost patent.  septal deviation suspected. Retrognathia is  seen.  Missing filum.  Dental status: overbite  Cardiovascular:  Regular rate and cardiac rhythm by pulse,  without distended neck veins. Respiratory: Lungs are clear to auscultation.  Skin:  Without evidence of ankle edema, or rash. Trunk: The patient's posture is erect.   NEUROLOGIC EXAM: The patient is awake and alert, oriented to place and time.   Memory subjective described as intact.  Attention span & concentration ability appears normal.  Speech is fluent,  without  dysarthria, dysphonia or aphasia.  Mood and affect are appropriate.  Cranial nerves: no loss of smell or taste reported  Pupils are equal and briskly reactive to light. Funduscopic exam deferred.  Extraocular movements in vertical and horizontal planes were intact and without nystagmus. No Diplopia. Wears readers.  Visual fields by finger perimetry are intact. Hearing was intact to soft voice , but patient had auditory processing problems.   Facial sensation intact to fine  touch.  Facial motor strength is symmetric and tongue and uvula move midline.  Neck ROM : rotation, tilt and flexion extension were normal for age and shoulder shrug was symmetrical.    Motor exam:  Symmetric bulk, tone and ROM.   Normal tone without cog-wheeling,  symmetric grip strength .   Sensory:  Fine touch, pinprick and vibration were tested  and  normal.  Proprioception tested in the upper extremities was normal.   Coordination: Rapid alternating movements in the fingers/hands were of normal speed.  The Finger-to-nose maneuver was intact without evidence of ataxia, dysmetria or tremor.   Gait and station: Patient could rise unassisted from a seated position, walked without assistive device.  Stance is of normal width/ base and the patient turned with 3 steps.  Toe and heel walk were deferred.  Deep tendon reflexes: in the  upper and lower extremities are symmetric and intact.  Babinski response was deferred .   ASSESSMENT AND PLAN 53 -year -old male  patient here with: histoyr of polysubstance abuse and in recovery for 2 years, sober, no longer smoking but chewing tobacco at this time. He used to snore while drinking, but not sure if now.  He is still chewing tobacco, not drinking. Had normal testosterone levels per [facility] lab.   Non restorative sleep with hypothyroidism.   Air way anatomy was indicating higher risk for sleep apnea. Mallampati 3 , nasal congestion, small lower jaw and possible ETOH fetopathy. .    1) the most recent starting of thyroid medications may have been to recent to improve fatigue.  2) not dreaming, oral breathing, likely still snoring. Check with a HST if the patient has hypoxia or OSA/ CSA.    3) if positive for apnea, he may  prefer a dental device over CPAP.  He certainly would benefit from modafinil.    I will order a HST for this patient.    I plan to follow up either personally or through our NP within 3-5 months.   I would like to  thank Doylene Canning, FNP and Doylene Canning, Fnp [phone] [place],  [place] [address] for allowing me to meet with and to take care of this pleasant patient.    After spending a total time of  45  minutes face to face and additional time for physical and neurologic examination, review of laboratory studies,  personal review of imaging studies, reports and results of other testing and review of referral information / records as far as provided in visit,   Electronically signed by: Melvyn Novas, MD 03/25/2023 11:31 AM  [facility] Board certified by The [employer] of Sleep Medicine and Diplomate of the [employer] of Sleep Medicine. Board certified In Neurology through the ABPN, Fellow of the [employer] of Neurology.

## 2023-03-25 NOTE — Telephone Encounter (Signed)
Pt called to  make [facility] aware; Benedetto Goad ride has not picked him up yet. Driver is 1 mins away and takes 9 minutes to get to [facility]. Informed pt if get her after 11 am will have to reschedule

## 2023-04-08 ENCOUNTER — Ambulatory Visit: Payer: 59 | Admitting: Neurology

## 2023-04-08 DIAGNOSIS — F1021 Alcohol dependence, in remission: Secondary | ICD-10-CM

## 2023-04-08 DIAGNOSIS — F1590 Other stimulant use, unspecified, uncomplicated: Secondary | ICD-10-CM

## 2023-04-08 DIAGNOSIS — G4733 Obstructive sleep apnea (adult) (pediatric): Secondary | ICD-10-CM | POA: Diagnosis not present

## 2023-04-08 DIAGNOSIS — G478 Other sleep disorders: Secondary | ICD-10-CM

## 2023-04-08 DIAGNOSIS — G4719 Other hypersomnia: Secondary | ICD-10-CM

## 2023-04-19 ENCOUNTER — Telehealth: Payer: Self-pay

## 2023-04-19 NOTE — Telephone Encounter (Signed)
Nikki from [facility] has called requesting visit notes/procedure notes on this patient. They are the referring office from his sleep referral, could someone please send these over their office? Ph: [phone]; Fx: [phone] Thanks!! - AG

## 2023-04-19 NOTE — Telephone Encounter (Signed)
Ian Munoz, please send notes. Thank you.

## 2023-04-20 DIAGNOSIS — G4733 Obstructive sleep apnea (adult) (pediatric): Secondary | ICD-10-CM | POA: Insufficient documentation

## 2023-04-20 NOTE — Addendum Note (Signed)
Addended by: Melvyn Novas on: 04/20/2023 02:40 PM   Modules accepted: Orders

## 2023-12-25 ENCOUNTER — Other Ambulatory Visit: Payer: Self-pay

## 2023-12-25 ENCOUNTER — Emergency Department (HOSPITAL_COMMUNITY)
Admission: EM | Admit: 2023-12-25 | Discharge: 2023-12-25 | Disposition: A | Discharge status: Home / Self Care | Payer: MEDICAID | Attending: Emergency Medicine | Admitting: Emergency Medicine

## 2023-12-25 ENCOUNTER — Emergency Department (HOSPITAL_COMMUNITY): Payer: MEDICAID

## 2023-12-25 ENCOUNTER — Encounter (HOSPITAL_COMMUNITY): Payer: Self-pay | Admitting: *Deleted

## 2023-12-25 DIAGNOSIS — R0981 Nasal congestion: Secondary | ICD-10-CM | POA: Diagnosis present

## 2023-12-25 DIAGNOSIS — R072 Precordial pain: Secondary | ICD-10-CM | POA: Insufficient documentation

## 2023-12-25 LAB — I-STAT CHEM 8, ED
BUN: 9 mg/dL (ref 6–20)
Calcium, Ion: 1.16 mmol/L (ref 1.15–1.40)
Chloride: 105 mmol/L (ref 98–111)
Creatinine, Ser: 0.9 mg/dL (ref 0.61–1.24)
Glucose, Bld: 95 mg/dL (ref 70–99)
HCT: 44 % (ref 39.0–52.0)
Hemoglobin: 15 g/dL (ref 13.0–17.0)
Potassium: 3.9 mmol/L (ref 3.5–5.1)
Sodium: 141 mmol/L (ref 135–145)
TCO2: 23 mmol/L (ref 22–32)

## 2023-12-25 LAB — BASIC METABOLIC PANEL WITH GFR
Anion gap: 9 (ref 5–15)
BUN: 9 mg/dL (ref 6–20)
CO2: 25 mmol/L (ref 22–32)
Calcium: 8.9 mg/dL (ref 8.9–10.3)
Chloride: 104 mmol/L (ref 98–111)
Creatinine, Ser: 0.87 mg/dL (ref 0.61–1.24)
GFR, Estimated: >60 mL/min (ref >60)
Glucose, Bld: 93 mg/dL (ref 70–99)
Potassium: 3.8 mmol/L (ref 3.5–5.1)
Sodium: 138 mmol/L (ref 135–145)

## 2023-12-25 LAB — TROPONIN I (HIGH SENSITIVITY): Troponin I (High Sensitivity): <2 ng/L (ref <18)

## 2023-12-25 LAB — CBC
HCT: 43.1 % (ref 39.0–52.0)
Hemoglobin: 14.5 g/dL (ref 13.0–17.0)
MCH: 33.5 pg (ref 26.0–34.0)
MCHC: 33.6 g/dL (ref 30.0–36.0)
MCV: 99.5 fL (ref 80.0–100.0)
Platelets: 257 K/uL (ref 150–400)
RBC: 4.33 MIL/uL (ref 4.22–5.81)
RDW: 12.7 % (ref 11.5–15.5)
WBC: 6.2 K/uL (ref 4.0–10.5)
nRBC: 0 % (ref 0.0–0.2)

## 2023-12-25 LAB — LIPASE, BLOOD: Lipase: 42 U/L (ref 11–51)

## 2023-12-25 LAB — RESP PANEL BY RT-PCR (RSV, FLU A&B, COVID)  RVPGX2
Influenza A by PCR: NEGATIVE
Influenza B by PCR: NEGATIVE
Resp Syncytial Virus by PCR: NEGATIVE
SARS Coronavirus 2 by RT PCR: NEGATIVE

## 2023-12-25 MED ORDER — FAMOTIDINE IN NACL 20-0.9 MG/50ML-% IV SOLN
20.0000 mg | Freq: Once | INTRAVENOUS | Status: AC
  Administered 2023-12-25: 20 mg via INTRAVENOUS
  Filled 2023-12-25: qty 50

## 2023-12-25 NOTE — ED Notes (Signed)
Patient verbalizes understanding of discharge instructions. Opportunity for questioning and answers were provided. Armband removed by staff, pt discharged from ED. Ambulated out to lobby  

## 2023-12-25 NOTE — ED Triage Notes (Signed)
 Pt reports Runny nose, sore throat, chills last night. Has been taking OTC mucinex without improvement   Today started having a productive cough with chest heaviness which is most concerning to him  In recovery for drugs and alcohol

## 2023-12-25 NOTE — ED Notes (Signed)
 Pt is in X-Ray, X-Ray will bring pt to 15 when done.

## 2023-12-25 NOTE — Discharge Instructions (Signed)
 You were seen in the emergency department for several days of congestion, cough and 1 day of chest pain.  While you are here we did a physical exam, labs, chest x-ray and EKG which were all reassuring.  This showed no evidence of life-threatening causes of chest pain including heart attack or blood clot.  Please use Tylenol  and ibuprofen  at home (no more than 2000mg  of ibuprofen  and 4000mg  of tylenol  per day). Please come back to the ED if you have severe pain, shortness of breath, develop fevers, or if you have any other reason to believe you need emergency care.

## 2023-12-25 NOTE — ED Provider Notes (Signed)
 [facility] AT [facility] Provider Note HPI Ian Munoz is a 43 y.o. male with a medical history of sleep apnea, enlarged prostate, depression who presents emergency department with 3 days of congestion and cough along with 1 day of chest pain.  The patient states that he has been congested for 4 days.  He has been using Mucinex for symptoms.  He also has mildly productive cough.  No known sick contacts.  No fevers, headaches or vision changes.  Today, he developed left-sided chest pressure.  It does not radiate.  His mom died at an early age from cardiovascular disease.  He does not smoke  Past Medical History:  Diagnosis Date   Depression    Prostate enlargement    Past Surgical History:  Procedure Laterality Date   EYE SURGERY     IR ANGIOGRAM FOLLOW UP STUDY  12/29/2021   IR ANGIOGRAM SELECTIVE EACH ADDITIONAL VESSEL  12/29/2021   IR EMBO VENOUS NOT HEMORR HEMANG  INC GUIDE ROADMAPPING  12/29/2021   IR RADIOLOGIST EVAL & MGMT  11/17/2021   IR RADIOLOGIST EVAL & MGMT  02/06/2022   IR US  GUIDE VASC ACCESS RIGHT  12/29/2021   IR VENOGRAM RENAL UNI LEFT  12/29/2021   WRIST SURGERY Right     Review of Systems Pertinent positives and negative findings are listed as part of the History of Present Illness and MDM  Physical Exam Vitals:   12/25/23 2121 12/25/23 2258 12/25/23 2300 12/25/23 2315  BP: 123/88  119/89 130/88  Pulse: 84 72 83 81  Resp: 16   18  Temp: 98.5 F (36.9 C)     SpO2: 94% 100% 100% 100%     Constitutional Nursing notes reviewed Vital signs reviewed  HEENT No obvious trauma Pupils round, equal, and reactive to light. Pupils cross midline Uvula midline with no evidence of posterior pharyngeal edema, erythema or abscess  Respiratory Effort normal Breathing well on room air CTAB  CV Normal rate and rhythm  No pitting edema  Abdomen Epigastric tenderness to palpation without rebound or guarding No peritonitis  MSK Atraumatic No  obvious deformity ROM appropriate  Neuro Awake and alert Pupils cross midline Moving all extremities    MDM:  Initial Differential Diagnoses includes viral sinusitis, pharyngitis, viral URI, ACS, pneumonia, pneumothorax, pulmonary embolism, gastritis, GERD, pancreatitis  I reviewed the patient's vitals, the nursing triage note and evaluated the patient at bedside.  Patient presents with 3 days of congestion and cough and 1 day of chest pain.  Physical exam is notable for epigastric tenderness to palpation without rebound or guarding. Patient given famotidine .  Lipase within normal limits, lowering my suspicion for pancreatitis.  No evidence of posterior pharyngeal erythema, edema or uvular deviation to suggest pharyngitis or PTA.    I considered ACS and reviewed/interpreted the EKG. EKG shows normal sinus rhythm with no evidence of acute ischemic changes or high grade conduction block. There is no STEMI or contiguous T wave inversions. No evidence of new-onset arrythmia. The PR, QRS, and QT intervals are normal.   The patient was risk stratified with a HEART score of 1 (low risk). Troponin  <2.  Given low risk heart score with 1 day of chest pain that has not changed in nature and a reassuring EKG, will not pursue further workup for ACS.  Chest x-ray reviewed and interpreted by myself which shows no pneumonia, pneumothorax, mediastinal widening or large pleural effusion  Doubt PE as the patient has no  tachypnea, hypoxia, tachycardia.  He has no risk factors for PE.  Low risk Wells and PERC negative.  He has no recent prolonged travel.  He does not smoke.  He does not have unilateral leg swelling or calf tenderness.  He has no pleuritic chest pain  Labs reviewed by myself are reassuring with no evidence of leukocytosis, anemia or electrolyte abnormality.  COVID/flu testing negative.  Patient reassured by these findings and discharged home  Procedures: Procedures  Medications administered in  the ED: Medications  famotidine  (PEPCID ) IVPB 20 mg premix (0 mg Intravenous Stopped 12/25/23 2326)     Impression: 1. Congestion of nasal sinus   2. Precordial pain      Patient's presentation is most consistent with acute presentation with potential threat to life or bodily function.  Disposition: ED Disposition:  Discharge   Discharge: Patient is felt to be medically appropriate for discharge at this time. Patient was instructed to follow up with their primary care doctor/specialists listed above for re-evaluation. Patient was given strict return precautions.  ED Discharge Orders     None             Dionisio Blunt, MD 12/25/23 [id number]    Freddi Hamilton, MD 12/26/23 2253

## 2023-12-25 NOTE — ED Triage Notes (Signed)
Reports cough and cold symptoms
